# Patient Record
Sex: Male | Born: 1935
Health system: Southern US, Community
[De-identification: ages and names within clinical notes are randomized; demographics above are authoritative.]

## PROBLEM LIST (undated history)

## (undated) DIAGNOSIS — E785 Hyperlipidemia, unspecified: Secondary | ICD-10-CM

## (undated) DIAGNOSIS — I219 Acute myocardial infarction, unspecified: Secondary | ICD-10-CM

## (undated) DIAGNOSIS — J449 Chronic obstructive pulmonary disease, unspecified: Secondary | ICD-10-CM

## (undated) DIAGNOSIS — E119 Type 2 diabetes mellitus without complications: Secondary | ICD-10-CM

## (undated) DIAGNOSIS — E079 Disorder of thyroid, unspecified: Secondary | ICD-10-CM

## (undated) DIAGNOSIS — I714 Abdominal aortic aneurysm, without rupture, unspecified: Secondary | ICD-10-CM

## (undated) DIAGNOSIS — J439 Emphysema, unspecified: Secondary | ICD-10-CM

## (undated) DIAGNOSIS — I639 Cerebral infarction, unspecified: Secondary | ICD-10-CM

## (undated) HISTORY — DX: Chronic obstructive pulmonary disease, unspecified: J44.9

## (undated) HISTORY — DX: Hyperlipidemia, unspecified: E78.5

## (undated) HISTORY — DX: Emphysema, unspecified: J43.9

## (undated) HISTORY — DX: Disorder of thyroid, unspecified: E07.9

## (undated) HISTORY — PX: HERNIA REPAIR: SHX51

## (undated) HISTORY — DX: Abdominal aortic aneurysm, without rupture, unspecified: I71.40

## (undated) HISTORY — DX: Abdominal aortic aneurysm, without rupture: I71.4

## (undated) HISTORY — PX: ABDOMINAL AORTA STENT: SHX1108

## (undated) HISTORY — DX: Cerebral infarction, unspecified: I63.9

## (undated) HISTORY — DX: Acute myocardial infarction, unspecified: I21.9

---

## 1999-03-21 ENCOUNTER — Encounter: Payer: Self-pay | Admitting: Emergency Medicine

## 1999-03-21 ENCOUNTER — Encounter: Payer: Self-pay | Admitting: Internal Medicine

## 1999-03-21 ENCOUNTER — Inpatient Hospital Stay (HOSPITAL_COMMUNITY): Admission: EM | Admit: 1999-03-21 | Discharge: 1999-03-26 | Payer: Self-pay | Admitting: Emergency Medicine

## 1999-03-22 ENCOUNTER — Encounter: Payer: Self-pay | Admitting: Internal Medicine

## 1999-03-24 ENCOUNTER — Encounter: Payer: Self-pay | Admitting: Internal Medicine

## 1999-12-11 ENCOUNTER — Inpatient Hospital Stay (HOSPITAL_COMMUNITY): Admission: EM | Admit: 1999-12-11 | Discharge: 1999-12-15 | Payer: Self-pay | Admitting: Emergency Medicine

## 1999-12-11 ENCOUNTER — Encounter: Payer: Self-pay | Admitting: Emergency Medicine

## 2004-10-29 ENCOUNTER — Ambulatory Visit (HOSPITAL_COMMUNITY): Admission: RE | Admit: 2004-10-29 | Discharge: 2004-10-29 | Payer: Self-pay | Admitting: Surgery

## 2004-11-01 ENCOUNTER — Emergency Department (HOSPITAL_COMMUNITY): Admission: EM | Admit: 2004-11-01 | Discharge: 2004-11-01 | Payer: Self-pay | Admitting: Emergency Medicine

## 2005-09-07 ENCOUNTER — Encounter: Admission: RE | Admit: 2005-09-07 | Discharge: 2005-09-07 | Payer: Self-pay | Admitting: Internal Medicine

## 2006-09-23 ENCOUNTER — Encounter: Admission: RE | Admit: 2006-09-23 | Discharge: 2006-09-23 | Payer: Self-pay | Admitting: Internal Medicine

## 2008-06-06 ENCOUNTER — Encounter: Admission: RE | Admit: 2008-06-06 | Discharge: 2008-06-06 | Payer: Self-pay | Admitting: Internal Medicine

## 2010-02-18 ENCOUNTER — Encounter: Admission: RE | Admit: 2010-02-18 | Discharge: 2010-02-18 | Payer: Self-pay | Admitting: Internal Medicine

## 2010-03-19 ENCOUNTER — Ambulatory Visit: Payer: Self-pay | Admitting: Vascular Surgery

## 2010-09-16 ENCOUNTER — Ambulatory Visit: Payer: Self-pay | Admitting: Vascular Surgery

## 2010-09-21 ENCOUNTER — Ambulatory Visit (HOSPITAL_COMMUNITY)
Admission: RE | Admit: 2010-09-21 | Discharge: 2010-09-21 | Payer: Self-pay | Source: Home / Self Care | Attending: Vascular Surgery | Admitting: Vascular Surgery

## 2010-10-07 ENCOUNTER — Ambulatory Visit
Admission: RE | Admit: 2010-10-07 | Discharge: 2010-10-07 | Payer: Self-pay | Source: Home / Self Care | Attending: Vascular Surgery | Admitting: Vascular Surgery

## 2010-10-15 ENCOUNTER — Inpatient Hospital Stay (HOSPITAL_COMMUNITY)
Admission: RE | Admit: 2010-10-15 | Discharge: 2010-10-18 | Payer: Self-pay | Source: Home / Self Care | Attending: Vascular Surgery | Admitting: Vascular Surgery

## 2010-10-19 LAB — CBC
HCT: 44.9 % (ref 39.0–52.0)
HCT: 33.7 % — ABNORMAL LOW (ref 39.0–52.0)
HCT: 36 % — ABNORMAL LOW (ref 39.0–52.0)
Hemoglobin: 15.5 g/dL (ref 13.0–17.0)
Hemoglobin: 11.5 g/dL — ABNORMAL LOW (ref 13.0–17.0)
Hemoglobin: 12 g/dL — ABNORMAL LOW (ref 13.0–17.0)
MCH: 32.8 pg (ref 26.0–34.0)
MCH: 32.1 pg (ref 26.0–34.0)
MCH: 31.4 pg (ref 26.0–34.0)
MCHC: 34.5 g/dL (ref 30.0–36.0)
MCHC: 34.1 g/dL (ref 30.0–36.0)
MCHC: 33.3 g/dL (ref 30.0–36.0)
MCV: 95.1 fL (ref 78.0–100.0)
MCV: 94.1 fL (ref 78.0–100.0)
MCV: 94.2 fL (ref 78.0–100.0)
Platelets: 233 10*3/uL (ref 150–400)
Platelets: 141 10*3/uL — ABNORMAL LOW (ref 150–400)
Platelets: 175 10*3/uL (ref 150–400)
RBC: 4.72 MIL/uL (ref 4.22–5.81)
RBC: 3.58 MIL/uL — ABNORMAL LOW (ref 4.22–5.81)
RBC: 3.82 MIL/uL — ABNORMAL LOW (ref 4.22–5.81)
RDW: 13.5 % (ref 11.5–15.5)
RDW: 13.6 % (ref 11.5–15.5)
RDW: 13.7 % (ref 11.5–15.5)
WBC: 11.3 10*3/uL — ABNORMAL HIGH (ref 4.0–10.5)
WBC: 9.6 10*3/uL (ref 4.0–10.5)
WBC: 10.6 10*3/uL — ABNORMAL HIGH (ref 4.0–10.5)

## 2010-10-19 LAB — TYPE AND SCREEN
ABO/RH(D): O NEG
Antibody Screen: NEGATIVE

## 2010-10-19 LAB — COMPREHENSIVE METABOLIC PANEL
ALT: 10 U/L (ref 0–53)
AST: 16 U/L (ref 0–37)
Albumin: 3.6 g/dL (ref 3.5–5.2)
Alkaline Phosphatase: 79 U/L (ref 39–117)
BUN: 18 mg/dL (ref 6–23)
CO2: 25 mEq/L (ref 19–32)
Calcium: 9.6 mg/dL (ref 8.4–10.5)
Chloride: 107 mEq/L (ref 96–112)
Creatinine, Ser: 1.08 mg/dL (ref 0.4–1.5)
GFR calc Af Amer: >60 mL/min (ref >60)
GFR calc non Af Amer: >60 mL/min (ref >60)
Glucose, Bld: 169 mg/dL — ABNORMAL HIGH (ref 70–99)
Potassium: 4.2 mEq/L (ref 3.5–5.1)
Sodium: 141 mEq/L (ref 135–145)
Total Bilirubin: 0.4 mg/dL (ref 0.3–1.2)
Total Protein: 6.1 g/dL (ref 6.0–8.3)

## 2010-10-19 LAB — BASIC METABOLIC PANEL
BUN: 11 mg/dL (ref 6–23)
BUN: 9 mg/dL (ref 6–23)
CO2: 26 mEq/L (ref 19–32)
CO2: 26 mEq/L (ref 19–32)
Calcium: 8.2 mg/dL — ABNORMAL LOW (ref 8.4–10.5)
Calcium: 8.7 mg/dL (ref 8.4–10.5)
Chloride: 106 mEq/L (ref 96–112)
Chloride: 105 mEq/L (ref 96–112)
Creatinine, Ser: 0.74 mg/dL (ref 0.4–1.5)
Creatinine, Ser: 0.89 mg/dL (ref 0.4–1.5)
GFR calc Af Amer: >60 mL/min (ref >60)
GFR calc Af Amer: >60 mL/min (ref >60)
GFR calc non Af Amer: >60 mL/min (ref >60)
GFR calc non Af Amer: >60 mL/min (ref >60)
Glucose, Bld: 131 mg/dL — ABNORMAL HIGH (ref 70–99)
Glucose, Bld: 169 mg/dL — ABNORMAL HIGH (ref 70–99)
Potassium: 3.8 mEq/L (ref 3.5–5.1)
Potassium: 4.1 mEq/L (ref 3.5–5.1)
Sodium: 137 mEq/L (ref 135–145)
Sodium: 136 mEq/L (ref 135–145)

## 2010-10-19 LAB — GLUCOSE, CAPILLARY
Glucose-Capillary: 154 mg/dL — ABNORMAL HIGH (ref 70–99)
Glucose-Capillary: 113 mg/dL — ABNORMAL HIGH (ref 70–99)

## 2010-10-19 LAB — URINE MICROSCOPIC-ADD ON

## 2010-10-19 LAB — URINALYSIS, ROUTINE W REFLEX MICROSCOPIC
Bilirubin Urine: NEGATIVE
Ketones, ur: NEGATIVE mg/dL
Leukocytes, UA: NEGATIVE
Nitrite: NEGATIVE
Protein, ur: NEGATIVE mg/dL
Specific Gravity, Urine: 1.019 (ref 1.005–1.030)
Urine Glucose, Fasting: 250 mg/dL — AB
Urobilinogen, UA: 0.2 mg/dL (ref 0.0–1.0)
pH: 5.5 (ref 5.0–8.0)

## 2010-10-19 LAB — BLOOD GAS, ARTERIAL
Acid-Base Excess: 1.3 mmol/L (ref 0.0–2.0)
Bicarbonate: 25.3 mEq/L — ABNORMAL HIGH (ref 20.0–24.0)
Drawn by: 181601
FIO2: 0.21 %
O2 Saturation: 95.2 %
Patient temperature: 98.6
TCO2: 26.5 mmol/L (ref 0–100)
pCO2 arterial: 39.3 mmHg (ref 35.0–45.0)
pH, Arterial: 7.424 (ref 7.350–7.450)
pO2, Arterial: 73.8 mmHg — ABNORMAL LOW (ref 80.0–100.0)

## 2010-10-19 LAB — APTT: aPTT: 23 seconds — ABNORMAL LOW (ref 24–37)

## 2010-10-19 LAB — PROTIME-INR
INR: 0.94 (ref 0.00–1.49)
Prothrombin Time: 12.8 seconds (ref 11.6–15.2)

## 2010-10-19 LAB — SURGICAL PCR SCREEN
MRSA, PCR: NEGATIVE
Staphylococcus aureus: NEGATIVE

## 2010-10-19 LAB — ABO/RH: ABO/RH(D): O NEG

## 2010-10-21 ENCOUNTER — Emergency Department (HOSPITAL_COMMUNITY)
Admission: EM | Admit: 2010-10-21 | Discharge: 2010-10-21 | Payer: Self-pay | Source: Home / Self Care | Admitting: Emergency Medicine

## 2010-10-24 ENCOUNTER — Other Ambulatory Visit: Payer: Self-pay | Admitting: Vascular Surgery

## 2010-10-24 DIAGNOSIS — I714 Abdominal aortic aneurysm, without rupture: Secondary | ICD-10-CM

## 2010-10-25 NOTE — Consult Note (Signed)
[name], Joseph [name] NO.:  [id number]  MEDICAL RECORD NO.:  [id number]          PATIENT TYPE:  INP  LOCATION:  2001                         FACILITY:  [facility]  PHYSICIAN:  Joseph Purpura, Joseph [name]      DATE OF BIRTH:  Apr 06, 1930  DATE OF CONSULTATION:  10/17/2010 DATE OF DISCHARGE:                                CONSULTATION   REASON FOR CONSULTATION:  Urinary retention.  PHYSICIAN REQUESTING CONSULTATION:  Fransisco Hertz, Joseph [name]  HISTORY:  Joseph [name] is a 77 year old gentleman with a history of benign prostatic hyperplasia on chronic medical management with tamsulosin under the care of Dr. Johnella [name].  He recently was admitted to the hospital and underwent endovascular repair of an abdominal aortic aneurysm by Dr. Waverly [name].  Postoperatively, he did very well from his procedure, but did develop postoperative urinary retention requiring in-and-out catheterization.  Although,  he did not initially have any hematuria, he was noted to have some blood at the urethral meatus likely a result of some trauma related to catheterizations.  He was initially kept off his tamsulosin, but this was restarted and he still was unable to void 48 hours from his procedure.  Therefore, an indwelling Foley catheter was placed, which returned grossly clear urine and did provide the patient relief.  He denies any prior history of urinary retention.  His baseline urinary symptoms include nocturia 2-3 times per night, which he noticed it significantly worsened when not on alpha blocker therapy.  He has been on therapy for approximately the last 2-3 years.  He denies a history of gross hematuria, UTIs, urolithiasis, GU malignancy/trauma/surgery.  PAST MEDICAL HISTORY: 1. Diabetes. 2. Dyslipidemia. 3. Coronary artery disease with history of myocardial infarction 10     years ago.  He is followed by Joseph [name], Joseph [name].  PAST SURGICAL HISTORY: 1. Left inguinal hernia repair. 2.  Percutaneous endovascular repair of abdominal aortic aneurysm.  MEDICATIONS:  Home medications include tamsulosin, aspirin, metformin, lisinopril, Zetia, Travatan.  ALLERGIES:  He has intolerance to STATINS.  FAMILY HISTORY:  No history of GU malignancy.  SOCIAL HISTORY:  He is married and has 3 children.  He is retired.  He does have a history of tobacco use and smoked a pack to a pack and a half of cigarettes per day for many years, but did quit smoking 5-6 years ago.  REVIEW OF SYSTEMS:  A complete review of systems was performed.  All systems are reviewed and are otherwise negative.  PHYSICAL EXAMINATION:  VITAL SIGNS:  He is currently afebrile with stable vital signs.  Temperature is 97.9, pulse 102, respirations 18, blood pressure 132/70. CONSTITUTIONAL:  Well-nourished, well-developed, age-appropriate male in no acute distress. HEENT:  Normocephalic, atraumatic. NECK:  Supple without lymphadenopathy or JVD. CARDIOVASCULAR:  Regular rate and rhythm. LUNGS:  Normal respiratory effort. ABDOMEN:  Soft, nontender, nondistended without abdominal masses.  He does have small bilateral inguinal incisions related to his recent endovascular repair. GU:  Normal male external genitalia with an indwelling 14-French Foley catheter draining grossly clear urine. DRE:  He has normal sphincter tone without rectal masses.  His prostate  measures approximately 60 grams without nodularity or induration. EXTREMITIES:  No edema. NEUROLOGIC:  Grossly intact.  LABORATORY DATA:  Serum creatinine 0.89.  Hemoglobin 12.0.  Urinalysis on October 13, 2010 preoperatively demonstrated 0-2 red blood cells and 0-2 white blood cells.  He did undergo a CT scan of the abdomen and pelvis on Feb 18, 2010, which was reviewed considering the concern for hematuria.  This did demonstrate a hyperdense lesion of the left kidney measuring 1.3 cm.  This appeared to be most consistent with a complex hemorrhagic  system, was stable from prior evaluation.  However, a small renal malignancy could not be excluded.  On evaluation of his bladder, he was noted to have bladder diverticula with calculi within the diverticula.  IMPRESSION: 1. Postoperative urinary retention:  He should continue current alpha     blocker therapy with tamsulosin 0.4 mg.  He will be scheduled to     follow up for a voiding trial in the next week. 2. Hematuria/left renal mass:  His hematuria or more accurately     urethral blood is most likely related to trauma from repeated     catheterization during his hospitalization.  He does not appear to     have any very concerning findings that would explain his hematuria     from his CT scan in May except for the possibility of bladder     calculi from which, he has been asymptomatic.  He does have a small     left renal lesion, which is likely benign but even if malignant is     unlikely to cause him any significant problems.  This can be     followed with followup imaging and we will arrange this at his     followup outpatient visit. 3. Bladder diverticula with bladder calculi:  I would plan to follow     this if he is asymptomatic and not having recurrent urinary tract     infections or other symptoms related to his bladder calculi.     Joseph Purpura, Joseph [name]     LB/MEDQ  D:  10/17/2010  T:  10/18/2010  Job:  [id number]  cc:   Joseph [name], M.D. Fransisco Hertz, Joseph [name] Di Kindle. Edilia Bo, M.D.  Electronically Signed by Joseph Purpura Joseph [name] on 10/25/2010 05:18:07 PM

## 2010-10-26 LAB — URINE MICROSCOPIC-ADD ON

## 2010-10-26 LAB — URINALYSIS, ROUTINE W REFLEX MICROSCOPIC
Leukocytes, UA: NEGATIVE
Nitrite: NEGATIVE
Protein, ur: 30 mg/dL — AB
Urobilinogen, UA: 1 mg/dL (ref 0.0–1.0)

## 2010-10-26 LAB — URINE CULTURE: Colony Count: 25000

## 2010-11-04 ENCOUNTER — Other Ambulatory Visit: Payer: Self-pay

## 2010-11-11 ENCOUNTER — Ambulatory Visit (INDEPENDENT_AMBULATORY_CARE_PROVIDER_SITE_OTHER): Payer: Medicare Other | Admitting: Vascular Surgery

## 2010-11-11 ENCOUNTER — Ambulatory Visit
Admission: RE | Admit: 2010-11-11 | Discharge: 2010-11-11 | Disposition: A | Discharge status: Home / Self Care | Payer: Medicare Other | Source: Ambulatory Visit | Attending: Vascular Surgery | Admitting: Vascular Surgery

## 2010-11-11 ENCOUNTER — Encounter (INDEPENDENT_AMBULATORY_CARE_PROVIDER_SITE_OTHER): Payer: Medicare Other

## 2010-11-11 DIAGNOSIS — I714 Abdominal aortic aneurysm, without rupture, unspecified: Secondary | ICD-10-CM

## 2010-11-11 DIAGNOSIS — Z48812 Encounter for surgical aftercare following surgery on the circulatory system: Secondary | ICD-10-CM

## 2010-11-11 MED ORDER — IOHEXOL 300 MG/ML  SOLN
100.0000 mL | Freq: Once | INTRAMUSCULAR | Status: AC | PRN
  Administered 2010-11-11: 100 mL via INTRAVENOUS

## 2010-11-14 ENCOUNTER — Emergency Department (HOSPITAL_COMMUNITY)
Admission: EM | Admit: 2010-11-14 | Discharge: 2010-11-14 | Disposition: A | Discharge status: Home / Self Care | Payer: Medicare Other | Attending: Emergency Medicine | Admitting: Emergency Medicine

## 2010-11-14 DIAGNOSIS — R300 Dysuria: Secondary | ICD-10-CM | POA: Insufficient documentation

## 2010-11-14 DIAGNOSIS — I252 Old myocardial infarction: Secondary | ICD-10-CM | POA: Insufficient documentation

## 2010-11-14 DIAGNOSIS — E119 Type 2 diabetes mellitus without complications: Secondary | ICD-10-CM | POA: Insufficient documentation

## 2010-11-14 DIAGNOSIS — N390 Urinary tract infection, site not specified: Secondary | ICD-10-CM | POA: Insufficient documentation

## 2010-11-14 DIAGNOSIS — I2510 Atherosclerotic heart disease of native coronary artery without angina pectoris: Secondary | ICD-10-CM | POA: Insufficient documentation

## 2010-11-14 LAB — URINE MICROSCOPIC-ADD ON

## 2010-11-14 LAB — URINALYSIS, ROUTINE W REFLEX MICROSCOPIC
Specific Gravity, Urine: 1.02 (ref 1.005–1.030)
Urobilinogen, UA: 1 mg/dL (ref 0.0–1.0)

## 2010-11-16 LAB — URINE CULTURE
Colony Count: >=100000
Culture  Setup Time: 201202112035

## 2010-11-16 NOTE — Assessment & Plan Note (Signed)
OFFICE VISIT  Joseph [name], Joseph [name] DOB:  06-27-1930                                       11/11/2010 [id number]#:[id number]  I saw this patient in the office for follow-up after his recent EVAR. This is [name] pleasant 77 year old gentleman who presented with [name] 5.6 cm infrarenal abdominal aortic aneurysm.  He was felt be [name] candidate for endovascular repair underwent percutaneous endovascular repair of his aneurysm on 10/15/2010.  Postoperative course was complicated by problems with urinary retention and he ended up having to go home with [name] Foley catheter. He has been followed by the urologist.  His only other complaint has been some generalized weakness.  PHYSICAL EXAMINATION:  This is [name] pleasant 77 year old gentleman who appears stated age.  Temperature is 97.8, blood pressure is 111/70, heart rate is 103.  His groins look fine with no hematoma.  Abdomen: Soft and nontender.  Lungs:  Clear bilaterally to auscultation.  I independently interpreted his arterial Doppler study which shows [name] triphasic dorsalis pedis signal on the right with an ABI of 86%.  He has triphasic signals in the left with an ABI of 97%.  I also reviewed his CT scan which has not yet been interpreted by the radiologist and this shows good position of his stent graft with no evidence of endoleak and stable size of his aneurysm.  Overall I am pleased with his progress.  I plan on seeing him back in 6 months with follow-up CT scan.  He knows to call sooner if he has problems.    Di Kindle. Edilia Bo, M.D. Electronically Signed  CSD/MEDQ  D:  11/11/2010  T:  11/12/2010  Job:  [id number]  cc:   Lyn Records, M.D. Candyce Churn, M.D.

## 2010-11-27 ENCOUNTER — Other Ambulatory Visit: Payer: Self-pay | Admitting: Internal Medicine

## 2010-11-27 DIAGNOSIS — E0590 Thyrotoxicosis, unspecified without thyrotoxic crisis or storm: Secondary | ICD-10-CM

## 2010-11-30 ENCOUNTER — Ambulatory Visit
Admission: RE | Admit: 2010-11-30 | Discharge: 2010-11-30 | Disposition: A | Discharge status: Home / Self Care | Payer: Medicare Other | Source: Ambulatory Visit | Attending: Internal Medicine | Admitting: Internal Medicine

## 2010-11-30 DIAGNOSIS — E0590 Thyrotoxicosis, unspecified without thyrotoxic crisis or storm: Secondary | ICD-10-CM

## 2010-12-14 ENCOUNTER — Encounter (HOSPITAL_COMMUNITY): Payer: Medicare Other

## 2010-12-14 ENCOUNTER — Other Ambulatory Visit: Payer: Self-pay | Admitting: Urology

## 2010-12-14 LAB — POCT I-STAT, CHEM 8
BUN: 21 mg/dL (ref 6–23)
Calcium, Ion: 1.13 mmol/L (ref 1.12–1.32)
Creatinine, Ser: 0.9 mg/dL (ref 0.4–1.5)
Glucose, Bld: 142 mg/dL — ABNORMAL HIGH (ref 70–99)
HCT: 47 % (ref 39.0–52.0)
TCO2: 28 mmol/L (ref 0–100)

## 2010-12-14 LAB — BASIC METABOLIC PANEL
BUN: 19 mg/dL (ref 6–23)
Chloride: 101 mEq/L (ref 96–112)
Creatinine, Ser: 0.79 mg/dL (ref 0.4–1.5)
GFR calc non Af Amer: >60 mL/min (ref >60)
Glucose, Bld: 96 mg/dL (ref 70–99)
Potassium: 4.7 mEq/L (ref 3.5–5.1)

## 2010-12-14 LAB — CBC
MCV: 95.6 fL (ref 78.0–100.0)
RBC: 4.74 MIL/uL (ref 4.22–5.81)
WBC: 8.5 10*3/uL (ref 4.0–10.5)

## 2010-12-14 LAB — SURGICAL PCR SCREEN: Staphylococcus aureus: NEGATIVE

## 2010-12-18 ENCOUNTER — Observation Stay (HOSPITAL_COMMUNITY)
Admission: RE | Admit: 2010-12-18 | Discharge: 2010-12-19 | Disposition: A | Discharge status: Home / Self Care | Payer: Medicare Other | Source: Ambulatory Visit | Attending: Urology | Admitting: Urology

## 2010-12-18 DIAGNOSIS — J449 Chronic obstructive pulmonary disease, unspecified: Secondary | ICD-10-CM | POA: Insufficient documentation

## 2010-12-18 DIAGNOSIS — N320 Bladder-neck obstruction: Secondary | ICD-10-CM | POA: Insufficient documentation

## 2010-12-18 DIAGNOSIS — N401 Enlarged prostate with lower urinary tract symptoms: Principal | ICD-10-CM | POA: Insufficient documentation

## 2010-12-18 DIAGNOSIS — E119 Type 2 diabetes mellitus without complications: Secondary | ICD-10-CM | POA: Insufficient documentation

## 2010-12-18 DIAGNOSIS — J4489 Other specified chronic obstructive pulmonary disease: Secondary | ICD-10-CM | POA: Insufficient documentation

## 2010-12-18 DIAGNOSIS — Z9861 Coronary angioplasty status: Secondary | ICD-10-CM | POA: Insufficient documentation

## 2010-12-18 DIAGNOSIS — N210 Calculus in bladder: Secondary | ICD-10-CM | POA: Insufficient documentation

## 2010-12-18 DIAGNOSIS — N138 Other obstructive and reflux uropathy: Principal | ICD-10-CM | POA: Insufficient documentation

## 2010-12-18 DIAGNOSIS — Z01812 Encounter for preprocedural laboratory examination: Secondary | ICD-10-CM | POA: Insufficient documentation

## 2010-12-18 DIAGNOSIS — I2510 Atherosclerotic heart disease of native coronary artery without angina pectoris: Secondary | ICD-10-CM | POA: Insufficient documentation

## 2010-12-18 DIAGNOSIS — N323 Diverticulum of bladder: Secondary | ICD-10-CM | POA: Insufficient documentation

## 2010-12-18 DIAGNOSIS — Z79899 Other long term (current) drug therapy: Secondary | ICD-10-CM | POA: Insufficient documentation

## 2010-12-18 DIAGNOSIS — Z87891 Personal history of nicotine dependence: Secondary | ICD-10-CM | POA: Insufficient documentation

## 2010-12-18 DIAGNOSIS — I10 Essential (primary) hypertension: Secondary | ICD-10-CM | POA: Insufficient documentation

## 2010-12-18 LAB — GLUCOSE, CAPILLARY
Glucose-Capillary: 112 mg/dL — ABNORMAL HIGH (ref 70–99)
Glucose-Capillary: 101 mg/dL — ABNORMAL HIGH (ref 70–99)

## 2010-12-19 LAB — GLUCOSE, CAPILLARY

## 2010-12-20 NOTE — Op Note (Signed)
NAMEMAKAIL, Joseph [name] NO.:  [id number]  MEDICAL RECORD NO.:  [id number]           PATIENT TYPE:  O  LOCATION:  DAYL                         FACILITY:  [facility]  PHYSICIAN:  Heloise Purpura, MD      DATE OF BIRTH:  1930-07-22  DATE OF PROCEDURE:  12/18/2010 DATE OF DISCHARGE:                              OPERATIVE REPORT   PREOPERATIVE DIAGNOSES: 1. Bladder outlet obstruction secondary to benign prostatic     hyperplasia. 2. Bladder calculi. 3. Recurrent urinary tract infections.  POSTOPERATIVE DIAGNOSES: 1. Bladder outlet obstruction secondary to benign prostatic     hyperplasia. 2. Bladder calculi. 3. Recurrent urinary tract infections.  PROCEDURE: 1. Cystoscopy. 2. Transurethral vaporization of the prostate. 3. Removal of bladder calculi.  SURGEON:  Heloise Purpura, MD  ASSISTANT:  None.  COMPLICATIONS:  None.  ESTIMATED BLOOD LOSS:  Minimal.  INDICATIONS:  Mr. Gear is a 77 year old gentleman who has a history of recurrent urinary tract infections and bladder calculi felt to be secondary to benign prostatic hyperplasia and bladder outlet obstruction.  He is also known to have bladder diverticula.  Based on his recurrent multiple infections, we discussed options for treatment and he did elect to proceed with a procedure for his bladder outlet obstruction.  After reviewing options, he consented to proceed with transurethral vaporization of the prostate.  The potential risks, complications, and alternative options associated with this procedure were discussed in detail and informed consent obtained.  DESCRIPTION OF PROCEDURE:  The patient was taken to the operating room and a general anesthetic was administered.  He was given preoperative antibiotics, placed in the dorsal lithotomy position, and prepped and draped in the usual sterile fashion.  Next, a preoperative time-out was performed.  Cystourethroscopy was then performed which revealed  normal anterior urethra.  Inspection of the bladder did reveal a high bladder neck with lateral lobe hypertrophy.  Inspection of the bladder revealed a ureteral orifices in the normal anatomic position.  There were too large bladder diverticula consistent with Hutch diverticula.  There was moderate to severe trabeculation throughout the bladder.  No bladder tumors or other mucosal pathology was identified.  There were noted to be small multiple bladder calculi at the base of the bladder.  After cystoscopy was completed with both the 12 and 70 degrees lenses, the patient's urethra was serially dilated from 22-French up to 28-French. The 26-French resectoscope sheath was then placed into the bladder without difficulty and using the gyrus vaporization button, the patient's prostate was vaporized utilizing saline for irrigation.  The prostatic urethra from the bladder neck back to the verumontanum was examined and the adenoma was vaporized in a systematic fashion beginning at 6 o'clock and then extending laterally and anteriorly.  Once the prostatic adenoma had been completely vaporized, reinspection of the bladder revealed the aforementioned bladder calculi which were removed with Toomey syringe irrigation.  Hemostasis was achieved with electrocautery and a 22-French three-way catheter was placed with a catheter plug in now continuous irrigation.  The patient tolerated the procedure well without complications.  He was able to be awakened and transferred to recovery unit in  satisfactory condition.     Heloise Purpura, MD     LB/MEDQ  D:  12/18/2010  T:  12/18/2010  Job:  [id number]  Electronically Signed by Heloise Purpura MD on 12/20/2010 04:44:34 PM

## 2010-12-30 NOTE — Discharge Summary (Signed)
  [name], Joseph [name]              ACCOUNT NO.:  [id number]  MEDICAL RECORD NO.:  [id number]           PATIENT TYPE:  O  LOCATION:  1408                         FACILITY:  [facility]  PHYSICIAN:  Heloise Purpura, MD      DATE OF BIRTH:  09-02-1930  DATE OF ADMISSION:  12/18/2010 DATE OF DISCHARGE:  12/19/2010                              DISCHARGE SUMMARY   ADMISSION DIAGNOSIS: 1. Bladder outlet obstruction secondary to benign prostatic     hyperplasia. 2. Bladder calculi. 3. Recurrent urinary tract infections.  DISCHARGE DIAGNOSES: 1. Bladder outlet obstruction secondary to benign prostatic     hyperplasia. 2. Bladder calculi. 3. Recurrent urinary tract infections.  PROCEDURES: 1. Cystoscopy. 2. Transurethral vaporization of the prostate.  HISTORY AND PHYSICAL:  For full details, please see admission history and physical.  Briefly, [name] is a 77 year old gentleman who has had recurrent urinary tract infections.  He has a longstanding history of benign prostatic hyperplasia, status post TURP, in the distant past. He was felt to have recurrent benign prostatic hyperplasia based on his evaluation which included recurrent urinary tract infections and bladder calculi.  We discussed options for reducing the risk of these complications and he elected to proceed with the above procedures.  HOSPITAL COURSE:  On December 18, 2010, he was taken to the operating room and underwent transurethral vaporization of the prostate.  He tolerated this procedure well without complications.  Postoperatively, a Foley catheter was left indwelling and his urine remained clear overnight.  He was able to undergo a voiding trial the following morning which he completed successfully.  He was, therefore, able to be discharged to home in stable condition.  DISPOSITION:  Home.  DISCHARGE MEDICATIONS:  He will resume his regular home medications excepting any aspirin, nonsteroidal anti-inflammatory  drugs, or herbal supplements.  DISCHARGE INSTRUCTIONS:  He has been instructed to resume his regular diet, but to avoid activities that involve heavy lifting or strenuous activity.  FOLLOWUP:  He will follow up in the next 4 weeks with a PVR in the office.     Heloise Purpura, MD     LB/MEDQ  D:  12/20/2010  T:  12/20/2010  Job:  [id number]  Electronically Signed by Heloise Purpura MD on 12/30/2010 10:19:39 PM

## 2011-01-12 ENCOUNTER — Other Ambulatory Visit (HOSPITAL_COMMUNITY): Payer: Self-pay | Admitting: Internal Medicine

## 2011-01-25 ENCOUNTER — Encounter (HOSPITAL_COMMUNITY)
Admission: RE | Admit: 2011-01-25 | Discharge: 2011-01-25 | Disposition: A | Discharge status: Home / Self Care | Payer: Medicare Other | Source: Ambulatory Visit | Attending: Internal Medicine | Admitting: Internal Medicine

## 2011-01-25 DIAGNOSIS — E0520 Thyrotoxicosis with toxic multinodular goiter without thyrotoxic crisis or storm: Secondary | ICD-10-CM | POA: Insufficient documentation

## 2011-01-26 ENCOUNTER — Other Ambulatory Visit (HOSPITAL_COMMUNITY): Payer: Self-pay | Admitting: Internal Medicine

## 2011-01-26 ENCOUNTER — Ambulatory Visit (HOSPITAL_COMMUNITY)
Admission: RE | Admit: 2011-01-26 | Discharge: 2011-01-26 | Disposition: A | Discharge status: Home / Self Care | Payer: Medicare Other | Source: Ambulatory Visit | Attending: Internal Medicine | Admitting: Internal Medicine

## 2011-01-26 ENCOUNTER — Encounter (HOSPITAL_COMMUNITY)
Admission: RE | Admit: 2011-01-26 | Discharge: 2011-01-26 | Disposition: A | Discharge status: Home / Self Care | Payer: Medicare Other | Source: Ambulatory Visit | Attending: Internal Medicine | Admitting: Internal Medicine

## 2011-01-26 DIAGNOSIS — E0520 Thyrotoxicosis with toxic multinodular goiter without thyrotoxic crisis or storm: Secondary | ICD-10-CM | POA: Insufficient documentation

## 2011-01-26 DIAGNOSIS — E0590 Thyrotoxicosis, unspecified without thyrotoxic crisis or storm: Secondary | ICD-10-CM

## 2011-01-26 MED ORDER — SODIUM IODIDE I 131 CAPSULE
29.3000 | Freq: Once | INTRAVENOUS | Status: AC | PRN
  Administered 2011-01-26: 29.3 via ORAL

## 2011-01-26 MED ORDER — SODIUM IODIDE I 131 CAPSULE
9.3000 | Freq: Once | INTRAVENOUS | Status: AC | PRN
  Administered 2011-01-25: 9.3 via ORAL

## 2011-01-26 MED ORDER — SODIUM PERTECHNETATE TC 99M INJECTION
10.0000 | Freq: Once | INTRAVENOUS | Status: AC | PRN
  Administered 2011-01-26: 10 via INTRAVENOUS

## 2011-02-16 NOTE — Procedures (Signed)
DUPLEX ULTRASOUND OF ABDOMINAL AORTA   INDICATION:  AAA followup.   HISTORY:  Diabetes:  Yes.  Cardiac:  MI in 2001.  Hypertension:  No.  Smoking:  No.  Connective Tissue Disorder:  Family History:  No.  Previous Surgery:  No.   DUPLEX EXAM:         AP (cm)                   TRANSVERSE (cm)  Proximal             Not visualized  Mid                  5.04 cm                   5.56 cm  Distal               4.09 cm                   4.75 cm  Right Iliac          1.06 cm                   1.11 cm  Left Iliac           0.75 cm                   0.90 cm   PREVIOUS:  Date: 02/18/2010 by CAT scan  AP:  TRANSVERSE:  5.1   IMPRESSION:  1. Abdominal aortic aneurysm noted with largest measurement of 5.04 cm      X 5.56 cm.  2. Intraluminal clot noted.  3. Proximal abdominal aorta not visualized due to overlying bowel gas.   ___________________________________________  Di Kindle. Edilia Bo, M.D.   EM/MEDQ  D:  09/16/2010  T:  09/16/2010  Job:  [id number]

## 2011-02-16 NOTE — Assessment & Plan Note (Signed)
OFFICE VISIT   Joseph [name]  DOB:  December 27, 1929                                       09/16/2010  ZOXWR#:[id number]   I saw the patient in the office today for continued followup of his  abdominal aortic aneurysm.  I had originally seen him in consultation  with [name] 5.1 cm infrarenal abdominal aortic aneurysm in 03/2010.  We  explained that we generally would not consider elective repair in [name]  normal-risk patient unless the aneurysm reached 5.5 cm in maximum  diameter.  He comes in for his 48-month followup study.   Since I saw him last, he has had no abdominal pain.  He does have some  chronic low back pain mostly on the left side which he has had for about  6 months.  This came on gradually.  It is aggravated by any activity  with really no alleviating factors except for rest.  He has had no leg  pain or leg paresthesias.   PAST MEDICAL HISTORY:  Significant for adult-onset diabetes.  He does  not require insulin.  In addition, he has hypercholesterolemia and  coronary artery disease.  He had [name] myocardial infarction approximately  10 years ago.  He has been seen in the past by Dr. Verdis [name].  He  denies any history of congestive heart failure or history of COPD.   SOCIAL HISTORY:  He is married.  He has 3 children.  He is retired.  He  quit tobacco 5-6 years ago.  He had smoked 1 to 1-1/2 packs per day.   FAMILY HISTORY:  There is no history of premature cardiovascular disease  and he is unaware of any history of aneurysmal disease in his family.   REVIEW OF SYSTEMS:  GENERAL:  He had no recent weight loss, weight gain,  or problems with his appetite.  CARDIOVASCULAR:  He had no chest pain, chest pressure, palpitations, or  arrhythmias.  He does admit to dyspnea on exertion.  He has had no  orthopnea.  He has no history of stroke, TIAs, or amaurosis fugax.  He  has had no claudication, rest pain, or nonhealing ulcers.  He has had no  history of DVT  or phlebitis.  GI:  He has had some problems with constipation.  PULMONARY:  He does have [name] history of COPD.  GU:  He has some urinary frequency and nocturia.  NEUROLOGIC, HEMATOLOGIC, ENT, MUSCULOSKELETAL, PSYCHIATRIC,  INTEGUMENTARY review of systems are unremarkable as documented on the  medical history form in his chart.   PHYSICAL EXAMINATION:  General:  This is [name] pleasant 77 year old  gentleman who appears his stated age.  Blood pressure is 107/70, heart  rate 100, saturation 98%.  HEENT:  Unremarkable.  Lungs:  Clear  bilaterally to auscultation without rales, rhonchi, or wheezing.  Cardiovascular exam:  I do not detect any carotid bruits.  He has [name]  regular rate and rhythm.  He has palpable femoral pulses and palpable  dorsalis pedis and popliteal pulses bilaterally.  I cannot palpate [name]  left posterior tibial pulse.  He does have [name] right posterior tibial  pulse.  Both feet appear adequately perfused and he has no evidence of  atheroembolic disease.  He has no significant lower extremity swelling.  Abdomen:  Soft and nontender.  His aneurysm is palpable and nontender.  Musculoskeletal exam:  He has no major deformities or cyanosis.  Neurologic exam:  He has no focal weakness or paresthesias.  Skin:  There are no ulcers or rashes.   I have independently interpreted his Doppler study today which shows  that the aneurysm has enlarged to 5.6 cm in maximum diameter.  The right  iliac measures 1.06 cm and the left iliac 0.9 cm.   I have also reviewed his CT scan from previously.   Given the enlargement of his aneurysm to now greater than 5.5 cm, I have  recommend elective repair.  On reviewing his CAT scan, it appears that  he would be [name] candidate for an endovascular aneurysm repair which given  his age I think would be ideal.  I have scheduled him for an arteriogram  to further assess him for endovascular repair.  This has been scheduled  for 09/21/2010.  We have discussed the  indications for arteriography and  the potential complications including but not limited to bleeding,  arterial injury, renal insufficiency.  All of his questions were  answered and he is agreeable to proceed.  We will also arrange for him  to see Dr. Verdis [name] for preoperative cardiac evaluation.  Hopefully,  he will be [name] candidate for endovascular repair of his aneurysm and we  have discussed the procedure in the office today.  We have discussed the  potential complications of endovascular aneurysm repair including  endoleak, continued aneurysm expansion, and need for continued followup.  We will make further recommendations pending the results of his  arteriogram and his preoperative cardiac evaluation.     Joseph [name]. Joseph [name], M.D.  Electronically Signed   CSD/MEDQ  D:  09/16/2010  T:  09/17/2010  Job:  [id number]   cc:   Joseph [name], M.D.  Joseph [name], M.D.

## 2011-02-16 NOTE — H&P (Signed)
HISTORY AND PHYSICAL EXAMINATION   October 07, 2010   Re:  [facility], [place] [facility]              DOB:  07-Apr-1930   REASON FOR ADMISSION:  5.6 cm infrarenal abdominal aortic aneurysm.   HISTORY:  This is a pleasant 77 year old gentleman whom I had originally  seen in consultation in June 2011 with a 5.1-cm infrarenal abdominal  aortic aneurysm.  On a followup study in December 2011 by ultrasound,  the aneurysm had enlarged to 5.6 cm.  Of note, this has been  asymptomatic.  Given the enlargement of the aneurysm and the 55 to 10%  per year risk of rupture, elective repair is recommended.   PAST MEDICAL HISTORY:  1. Adult-onset diabetes.  He does not require insulin.  2. Hypercholesterolemia.  3. Coronary artery disease.  4. History of a myocardial infarction 10 years ago.  He denies any      history of congestive heart failure or history of COPD.   SOCIAL HISTORY:  He is married.  He has 3 children.  He is retired.  He  quit tobacco 5 to 6 years ago.  He had smoked 1 to 1-1/2 packs per day  of cigarettes for many years.   FAMILY HISTORY:  There is no history of premature cardiovascular  disease.  He is unaware of aneurysmal disease either.   MEDICATIONS:  1. Aspirin 81 mg p.o. b.i.d.  2. Metformin HCl 500 mg p.o. b.i.d.  3. Lisinopril 20 mg p.o. daily.  4. Zetia 10 mg p.o. daily.  5. Pulmicort 180 mcg 1 puff b.i.d.  6. Travatan 0.004% eyedrops daily.  7. Two other eye drops which he did not have with him.   ALLERGIES:  Statins.   REVIEW OF SYSTEMS:  GENERAL:  He has had no recent weight loss, weight  gain, or problem with his appetite.  CARDIOVASCULAR:  He had an chest  pain, chest pressure, palpitations, or arrhythmias.  He had has no  history of stroke, TIAs, or amaurosis fugax.  He is unaware of any  history of DVT or phlebitis.  GI:  He has an occasional problem with  constipation.  PULMONARY:  He does have a history of COPD.  He has had  no recent  productive cough, bronchitis, asthma, or wheezing.  GU:  He  has had urinary frequency and nocturia.  NEUROLOGIC, HEMATOLOGIC, ENT,  MUSCULOSKELETAL, PSYCHIATRIC, INTEGUMENTARY review of systems is  unremarkable.   PHYSICAL EXAMINATION:  GENERAL:  This is a pleasant 77 year old  gentleman who appears his stated age.  VITAL SIGNS:  Blood pressure 110/68, heart rate is 103, saturation 97%.  HEENT:  Unremarkable.  LUNGS:  Clear bilaterally to auscultation without rales, rhonchi, or  wheezing.  CARDIOVASCULAR:  I do not detect any carotid bruits.  He has a regular  rate and rhythm.  He has palpable femoral pulses and palpable popliteal  and dorsalis pedis pulses bilaterally.  I could not palpate posterior  tibial pulses.  He has no significant lower extremity swelling.  ABDOMEN:  Soft, nontender.  His aneurysm is palpable and nontender.  MUSCULOSKELETAL:  He has no major deformities or cyanosis.  NEUROLOGIC:  He has no focal weakness or paresthesias.   He has undergone a CT scan which shows the aneurysm with no complicating  factors.  He has also had an arteriogram in order to evaluate him for  endovascular repair.  He appears to have a nice infrarenal neck and the  only real challenge appears to be significant tortuosity of his iliac  arteries.  In addition, he has undergone preoperative cardiac evaluation  by Dr. Katrinka Blazing.  Based on his note dated September 23, 2010, he is cleared  for surgery.  His nuclear stress test showed inferior lateral MI with  peri-MI ischemia and ejection fraction of 51%.  This had not changed  compared to the study back in 2007.   We have discussed the options of open repair versus endovascular repair  of his aneurysm.  He has elected like to proceed with endovascular  repair.  We have discussed the potential complications of endovascular  repair including the risk of continued aneurysm enlargement and the need  for continued followup.  We have also discussed the  potential option of  having to proceed with open repair if there are any complications during  the procedure.  I  have explained that we will likely do this  percutaneously as I could have to explore the arteries to see if there  are any issues with that.  We have also discussed the risk and potential  complications including but not limited to renal insufficiency, MI, or  other unpredictable medical problems.  All of his questions were  answered and he is agreeable to proceed.  We will schedule this within  the next few weeks.     Di Kindle. Edilia Bo, M.D.  Electronically Signed   CSD/MEDQ  D:  10/07/2010  T:  10/08/2010  Job:  [id number]   cc:   Candyce Churn, M.D.  Lyn Records, M.D.

## 2011-02-16 NOTE — Consult Note (Signed)
NEW PATIENT CONSULTATION   Joseph Joseph [name], Joseph Joseph [name]  DOB:  Jul 05, 1930                                       03/19/2010  ZOXWR#:[id number]   I saw the patient in the office today in consultation concerning an  abdominal aortic aneurysm.  This is Joseph [name] pleasant 77 year old gentleman who  has been followed with Joseph [name] small aneurysm for many years by Dr. Kevan [name].  On  his most recent followup study he had Joseph [name] CAT scan as he had been having  some left flank pain.  This showed that the aneurysm had enlarged to 5.1  cm.  He was sent for vascular consultation.  Of note, the flank pain  came on gradually approximately 2 months ago.  It has been gradually  getting better and he has really not had any significant pain over the  last week or so.  He has had no associated abdominal pain.  He has had  no nausea or vomiting and no hematuria.  He states that he has had the  aneurysm for at least 4-5 years.   His past medical history is significant for adult onset diabetes,  hypercholesterolemia and coronary artery disease.  He has had Joseph [name]  myocardial infarction in the past and is followed by Dr. Garnette Joseph [name].  He  also has Joseph [name] history of COPD.  In addition, he has hypertension.   FAMILY HISTORY:  There is no history of aneurysmal disease or premature  cardiovascular disease that he is aware of.   SOCIAL HISTORY:  He is married.  He has three children.  He had smoked  for about 40 years but quit many years ago.   REVIEW OF SYSTEMS:  He has had an approximately 40 pound weight loss  over the last year.  He attributes this to having to watch his diet  because of his diabetes.  CARDIOVASCULAR:  He has had no chest pain, chest pressure, palpitations  or arrhythmias.  He has had no claudication, rest pain or nonhealing  ulcers.  He has had no history of stroke, TIAs or amaurosis fugax.  He  has had no history of DVT or phlebitis.  Pulmonary, GI, neurologic, musculoskeletal, psychiatric, ENT,  hematologic  review of systems is unremarkable and is documented on the  medical history from in his chart.  GU:  He does have some nocturia and occasional frequency.   PHYSICAL EXAMINATION:  General:  This is Joseph [name] pleasant 77 year old  gentleman who appears his stated age.  Vital signs:  Blood pressure is  141/72, saturation 96%, heart rate is 109.  HEENT:  Unremarkable.  Lungs:  Are clear bilaterally to auscultation without rales, rhonchi,  wheezes.  Cardiovascular:  I do not detect any carotid bruits.  He has Joseph [name]  regular rate and rhythm.  He has palpable femoral pulses and warm, well-  perfused feet without ischemic ulcers.  Abdomen:  Soft and nontender.  His aneurysm is palpable and nontender.  He has normal pitched bowel  sounds.  Musculoskeletal:  There are no major deformities or cyanosis.  Neurological:  He has no focal weakness or paresthesias.  Skin:  There  are no ulcers or rashes.   I have independently interpreted his CT scan of his abdomen and pelvis.  This shows that the maximum diameter of his aneurysm is 5.1 cm.  There  is Joseph [name] reasonable  infrarenal neck.  The iliacs are somewhat tortuous.  He  does have Joseph [name] cyst in the left kidney.   I have explained that the American Heart Association recommendations  would be to fix an aneurysm at 5.5 cm in the normal risk patient.  His  aneurysm is 5.1 cm.  I have recommended Joseph [name] followup ultrasound in 6  months and I will see him back at that time.  If the aneurysm were to  enlarge to greater than 5.5 cm based on his CAT scan it appears that he  would be Joseph [name] potential candidate for endovascular repair of his aneurysm.     Joseph Joseph [name]. Joseph Joseph [name], M.D.  Electronically Signed   CSD/MEDQ  D:  03/19/2010  T:  03/20/2010  Job:  [id number]   cc:   Joseph Joseph [name], M.D.

## 2011-02-19 NOTE — Discharge Summary (Signed)
[place]  Patient:    Joseph [name], Joseph [name]                     MRN: [id number] Adm. Date:  [id number] Disc. Date: [id number] Attending:  Lyn Records. Iii Dictator:   Anselm Lis, N.P. CC:         Pearla Dubonnet, M.D.                           Discharge Summary  PRIMARY CARE Kashae Carstens:  Pearla Dubonnet, M.D.  PROCEDURES: A. (December 14, 1999) PTCA mid CFX with reduction in stenosis from 80% to less    than 20%.  PTCA of OM1 with reduction in stenosis from 90% to less than    20%.  PTCA of OM2 with reduction in stenosis from 60% to less than 20%.    Lateral hypokinesis with EF of 60%.  B. (December 14, 1999) Cardiac catheterization with the following results:    1. LV gram:  lateral hypokinesis with EF of 60%.    2. Coronary angiography:       A. Left main 30%.       B. LAD 60-70% mid.       C. CFX mid 80%.  OM2 60%.  OM1 90%.       D. RCA:  ectasia; PDA 90%.    3. PTCA (double wire):  Mid CFX with reduction in stenosis from 80% to less      than 20%; PTCA of OM1 with reduction of stenosis from 90% to less than      20%; PTCA of OM2 with reduction of stenosis from 60% to less than 20%.  DISCHARGE DIAGNOSES/HOSPITAL COURSE: 1. Coronary atherosclerotic heart disease:  The patient ruled in for lateral    wall myocardial infarction with peak CK of 1929 with MB fraction 250.    Troponin I of peak of 9.35.  The patient was initiated on and completed 12 hour ReaPro infusion.  The patient was initiated on and completed 12 hour ReaPro infusion.  He will continue Plavix for a 3 week course.  Addition of Lopressor to medical regimen as well as enteric-coated aspirin once daily and sublingual p.r.n. nitroglycerin.  2. Risk factor modifications:  Unknown lipid profile; has had prior myositis    reaction to statins in the past.  He is a former tobacco smoker.  PLAN:  Discharged home in stable improved condition.  DISCHARGE MEDICATIONS: 1.  Enteric-coated aspirin 325 mg once daily. 2. (New) Lopressor 50 mg 1/2 tablet p.o. b.i.d. 3. Nitroglycerin 0.4 p.r.n. chest pain.  ACTIVITY:  No heavy lifting or pushing greater than 10-15 pounds or driving day of discharge.  He may  resume as before.  Patient deferred enrollment in cardiac rehabilitation days 2, secondary to severe DJD which is The patient was told to undergo no overhead lifting or active limiting for him.  DIET:  Low fat low cholesterol.  Dietary instructions for low fat low cholesterol diet will be provided.  He was provided information to enroll in [facility] classes.  WOUND CARE:  May shower.  SPECIAL INSTRUCTIONS:  Patient to call our clinic if he notices a large amount of swelling or bruising in groin area.  FOLLOWUP:  Dr. Verdis Prime - Thursday, December 24, 1999, at 4:30 p.m.  HOSPITAL COURSE:  Please also see dictated H&P.  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  On the day of admission he was watching T.V. when he had the sudden episode of epigastric burning pain lasting about 1-1/2 hours. No nausea, vomiting nor diaphoresis.  Discomfort eventually relieved after four sublingual nitrates.  He presented to [facility] Room where EKG was suggestive of lateral MI and his first set of cardiac enzymes were elevated.  He was initiated IV heparin, IV nitrates and antiplatelet agents.  He did well and maintained free during course of hospital admission.  On December 14, 1999, he was taken to the catheterization lab by Dr. Verdis Prime with the results as noted above.  For rest of the details of hospital admission please see above.  PAST MEDICAL HISTORY: 1. Coronary atherosclerotic heart disease.    A. (1996) myocardial infarction.  Subsequent PTCA of RCA.2. Pancreatitis       (1996 and recurrence in  2000). 3. Arthroscopy of the knee in July of 2000. 4. Chronic right bundle branch block. 5. History of hyperlipidemia with past myositis reaction to statins. 6. History of depression; situational surrounding his mothers death in 15-Jun-1999. 7. 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  LABORATORY DATA AND TESTS:  Serial cardiac enzymes:  First CK is 1107 with MB fraction 137. SEcond CK of 1929 with MB fraction 250.  Third CK is 1251 with MB fraction 207.  Troponin I upon presentation 1.81, subsequently 9.35.  Admission hemoglobin 15, hematocrit 47, WBC 10.  Platelets of 290.  At time of discharge WBC was 10.3, with platelets of 259.  ADMISSION CHEMISTRY PROFILE:  Revealed sodium of 139 with K of 3.7, chloride of 104, CO2 26, glucose 125, BUN 19, creatinine 0.9, and calcium of 8.9.  At the time of discharge creatinine was 0.9, with BUN of 11, K of 4.0.  Initial EKG revealed NSR with right bundle branch block question lateral changes but not really significantly different from 1999.  Chest x-ray revealed no active disease.  Amylase was okay at 35. DD:  12/15/99 TD:  12/15/99 Job: [id number]

## 2011-02-19 NOTE — Cardiovascular Report (Signed)
[place]  Patient:    Joseph [name], Joseph [name]                     MRN: [id number] Proc. Date: 12/14/99 Adm. Date:  [id number] Attending:  Lyn Records. Iii CC:         Pearla Dubonnet, M.D.             Cardiac Catheterization Lab                        Cardiac Catheterization  INDICATIONS FOR PROCEDURE:  Recent non-Q-wave myocardial infarction with significant enzyme elevation.  PROCEDURES PERFORMED: 1. Left heart catheterization. 2. Selective coronary angiography. 3. Left ventriculography by hand injection in LAO and RAO projections. 4. Percutaneous transluminal coronary angioplasty using double-wire technique on    the circumflex obtuse marginal #1 and #2 bifurcation.  CINE NO. [id number]  DESCRIPTION OF PROCEDURE:  After informed consent, a 6-French sheath was inserted into the right femoral artery using the modified Seldinger technique.  A 6-French A2 multipurpose catheter was used for hemodynamic recordings, left ventriculography, and selective left and right coronary angiography.  A #4 left  Judkins catheter was used also for left coronary angiography.  The patient tolerated the diagnostic procedure without complications.  After reviewing the cineangiograms, we decided to perform angioplasty on the circumflex.  A 7-French sheath was exchanged for the 6-French sheath.  A 7-French #4 left Judkins catheter was used to obtain guiding shots, and a Duo-Stat was used and contained two 190-cm long BMW wires.  We gave a total of 3500 units of heparin and a ReoPro bolus and infusion was begun.  We then performed angioplasty on the circumflex and marginal branches using a 3.0 x 20-mm long CrossSail balloon.  Two balloon inflations were performed in the mid circumflex and second obtuse marginal distribution and one  balloon inflation in the circumflex first obtuse marginal distribution.  The patient had chest burning with each balloon  inflation.  Each balloon inflation as for approximately two minutes total time in each territory.  Post procedure, the ACT was 246 seconds.  The patient tolerated the procedure without complications. Stenting was not performed because of the bifurcational location and a reasonably good angiographic result.  RESULTS:   I. Hemodynamic data      A. Aortic pressure 99/62.      B. Left ventricular pressure 99/11.  II. Left ventriculography:  Overall LV function appears normal.  There is mild      lateral wall hypokinesis.  Ejection fraction was estimated to be 60%.  No      mitral regurgitation is noted. III. Selective coronary angiography      A. Left main coronary:  Free of any significant obstruction.      B. Left anterior descending coronary:  The left anterior descending coronary         artery is a large vessel that contains a 60-70% mid vessel stenosis after         the second diagonal.  Multiple luminal irregularities are noted throughout         the LAD.  No high grade obstruction is noted in this vessel.      C. Circumflex artery:  The circumflex artery gives origin to three obtuse         marginal branches.  The second and third obtuse marginal branches follow a  significant plaque-containing stenosis in the mid circumflex.  The second         obtuse marginal contains an 80% stenosis.  The lesion proximal to the         bifurcation is 80% narrowed, depending upon the view used.  Both these         obtuse marginal branches are large, and are clearly larger than the first         obtuse marginal branch which contains a 50% ostial stenosis.      D. Right coronary artery:  The right coronary artery is a large vessel that         contains multiple luminal irregularities.  A bifurcating PDA contains tight         stenosis in both branches after the bifurcation.  Two large LV branches         contain irregularities but no high grade obstruction.  No significant          blockage other than in the PDA is noted in the right coronary.  PERCUTANEOUS CORONARY INTERVENTION:  The PTCA site in the mid circumflex and the second and third obtuse marginal branches gave an excellent final result with less than 20% stenosis noted in all dilated areas with brisk antegrade flow.  CONCLUSIONS: 1. Recent aborted lateral wall infarction due to circumflex occlusion.  The patient    apparently had spontaneous reperfusion. 2. Successful PTCA of the circumflex obtuse marginal #1 and #2 bifurcational    lesion. 3. Moderate mid LAD disease and moderate PDA disease. 4. Normal LV function.  RECOMMENDATION:  Medical therapy.  Continue ReoPro.  Clinical followup with probable nuclear testing of the LAD to rule out ischemia in that territory. DD:  12/14/98 TD:  12/14/99 Job: [id number]

## 2011-02-19 NOTE — Op Note (Signed)
Joseph [name], Joseph [name]              ACCOUNT NO.:  [id number]   MEDICAL RECORD NO.:  [id number]          PATIENT TYPE:  OIB   LOCATION:  NA                           FACILITY:  [facility]   PHYSICIAN:  Velora Heckler, MD      DATE OF BIRTH:  [birth year]   DATE OF PROCEDURE:  10/29/2004  DATE OF DISCHARGE:                                 OPERATIVE REPORT   PREOPERATIVE DIAGNOSIS:  Left inguinal hernia.   POSTOPERATIVE DIAGNOSIS:  Left inguinal hernia.   PROCEDURE:  Repair of left inguinal hernia with Prolene mesh.   SURGEON:  Velora Heckler, M.D.   ANESTHESIA:  General.   ESTIMATED BLOOD LOSS:  Minimal.   PREPARATION:  Betadine.   COMPLICATIONS:  None.   INDICATIONS FOR PROCEDURE:  The patient is a 77 year old white male from  [place], [place], presents at the request of Dr. Johnella Moloney  with a left inguinal hernia that has been present for approximately two  years.  It has gradually increased in size.  He now comes to surgery for  repair.   DESCRIPTION OF PROCEDURE:  The procedure was done in OR 16 at [facility].  The patient was brought to the operating room and placed  in a supine position on the operating table.  Following administration of  general anesthesia, the patient was prepped and draped in the usual strict  aseptic fashion.  After ascertaining an adequate level of anesthesia had  been obtained, a left inguinal incision was made with a #10 blade.  Dissection was carried down through the subcutaneous tissues.  Hemostasis  was obtained with electrocautery.  The external oblique fascia was incised  in line with its fibers and extended through the external inguinal ring.  Cord structures are dissected out of the inguinal canal and encircled with a  Penrose drain.  There was a moderate size direct inguinal hernia present.  Direct inguinal hernia sac is dissected out from the inguinal canal and  reduced.  It is held in reduction with  interrupted 3-0 Vicryl figure-of-  eight sutures.  The floor of the inguinal canal was recreated with a sheet  of Prolene mesh.  The mesh is secured to the pubic tubercle and along the  inguinal ligament with a running 2-0 Novofil suture.  The mesh was split to  accommodate the cord structures.  The superior margin of the mesh was  secured to the transversalis and internal oblique fascia with interrupted 2-  0 Novofil sutures.  The tails of the mesh were overlapped lateral to the  cord structures in the inferior edge of the tails so the mesh is secured to  the inguinal ligament with interrupted 2-0 Novofil sutures.  The cord is  explored.  There was no evidence of indirect inguinal hernia sac.  A small  lipoma of the cord is excised.  Good hemostasis is noted.  Field block is  placed with Marcaine.  The cord structures are returned to the inguinal  canal.  The external oblique fascia was closed with interrupted 3-0 Vicryl  sutures, the subcutaneous  tissues are closed with interrupted 3-0 Vicryl  sutures, the skin was anesthetized with local Marcaine anesthetic.  The skin  incision is closed with a running 4-0 Vicryl subcuticular  suture.  The wound is washed and dried, Benzoin and Steri-Strips are  applied.  Sterile dressings were applied.  The patient was awakened from  anesthesia and brought to the recovery room in stable condition.  The  patient tolerated the procedure well.      TMG/MEDQ  D:  10/29/2004  T:  10/29/2004  Job:  [id number]   cc:   Candyce Churn, M.D.  [address] [place]  [place] [address]  Fax: [phone]

## 2011-02-19 NOTE — H&P (Signed)
[place]  Patient:    Joseph [name], Joseph [name]                     MRN: [id number] Proc. Date: 12/10/99 Adm. Date:  [id number] Attending:  Eleanora Neighbor CC:         Pearla Dubonnet, M.D.                         History and Physical  REASON FOR ADMISSION:  Epigastric discomfort.  HISTORY OF PRESENT ILLNESS:  Mr. 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  He smoked about a pack and a half of cigarettes per day until two years ago with no alcohol use.  He has been married for 47 years and has three children.  HISTORY OF PRESENT ILLNESS:  He was watching TV today when he had the sudden onset of an epigastric burning pain as he describes as if he swallowed a hot coal. It began about 3:30 p.m., lasting a total until about 5 p.m.  There was no nausea,  vomiting, diaphoresis.  He treated it initially with water without help, took three nitroglycerin at home, went to [employer] and took another nitroglycerin without help.  The pain left about 5 p.m.  He now has complete relief although he has had lots of gas and belching.  He had an MI of 1996 with anterior chest pain like his sternal bone was going to explode.  This pain is not like that. He has had a history of pancreatitis on two different occasions in 1996 and in 000 but then he described it as a sudden hurting in a similar location but then his  abdomen would basically swell.  Todays symptoms were really not like either one of those symptoms.  ALLERGIES:  None but the statins cause myalgias.  CURRENT MEDICATIONS:  Aspirin, multivitamins.  PAST SURGICAL HISTORY:  Arthroscopy of the knee in July of 2000 and in August of 2000 he had a cardiac catheterization with angioplasty with his myocardial infarction in 1996.  At that time he had a distal right coronary artery angioplasty and the right  bundle-branch block was chronic and he had 60% occlusion of a diagonal from the LAD and proximal 20-30% stenosis in the LAD.  The first obtuse marginal and a 70% narrowing.  He has also had hyperlipidemia.  He has had a tendency to have arthritis, aches of his joints.  He has a history of pancreatitis of undetermined etiology.  He has a history of a right bundle-branch block.  He had some depression in August of 2000 at the time of his mothers death.  He had degenerative joint disease of his knees which is basically severe.  FAMILY HISTORY:  His mother died this year, she was in her 36 and had congestive heart failure.  Father died of Alzheimers disease in his 75s.  He has one brother that is a heavy smoker and probably has had coronary disease.  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 Darnell Level.  REVIEW OF SYSTEMS:  Basically negative except for the generalized arthritis and  aches.  In particular there is no diarrhea or blood in his stools.  Trouble genitourinary problems or neurological abnormalities.  PHYSICAL EXAMINATION:  VITAL SIGNS:  His heart rate is 76, regular, blood pressure is 120/67.  He  is in no acute distress.  HEENT:  Remarkable for dentures.  LUNGS:  Clear.  HEART:  Heart sounds are distant.  ABDOMEN:  Soft.  Normal bowel sounds.  Nontender.  RECTAL:  Guaiac-negative.  GENITALIA:  Prostate is 2+.  EXTREMITIES:  Without edema.  ECG shows right bundle-branch block, questionable lateral changes but not really significantly different from 1999.  Chest x-ray shows no acute disease.  CK-MB was positive at 137 with troponin of 1.81.  Amylase was 35.  White count as 18,600, hematocrit 48.  OVERALL IMPRESSION: 1. Epigastric pain with positive troponins and CK-MBs. 2. History of myocardial infarction in 1996. 3. History of pancreatitis.  PLAN:  This is certainly atypical symptoms, but lab would suggest  myocardial infarction and we will repeat that.  He is clearly pain-free and stable at this  time and with his ECG not showing acute changes he is not necessarily a candidate for going to the lab acutely.  We will leave him on IV heparin, IV nitroglycerin and antiplatelet agents.  We will rule out myocardial infarction. DD:  12/11/99 TD:  12/12/99 Job: [id number]

## 2011-04-08 ENCOUNTER — Encounter: Payer: Self-pay | Admitting: Vascular Surgery

## 2011-05-12 ENCOUNTER — Other Ambulatory Visit: Payer: Self-pay | Admitting: Vascular Surgery

## 2011-05-12 ENCOUNTER — Ambulatory Visit: Payer: Medicare Other | Admitting: Vascular Surgery

## 2011-05-12 DIAGNOSIS — I714 Abdominal aortic aneurysm, without rupture: Secondary | ICD-10-CM

## 2011-05-26 ENCOUNTER — Other Ambulatory Visit: Payer: Self-pay | Admitting: Vascular Surgery

## 2011-05-26 ENCOUNTER — Encounter: Payer: Self-pay | Admitting: *Deleted

## 2011-05-26 ENCOUNTER — Ambulatory Visit (INDEPENDENT_AMBULATORY_CARE_PROVIDER_SITE_OTHER): Payer: Medicare Other | Admitting: Vascular Surgery

## 2011-05-26 ENCOUNTER — Encounter: Payer: Self-pay | Admitting: Vascular Surgery

## 2011-05-26 ENCOUNTER — Ambulatory Visit
Admission: RE | Admit: 2011-05-26 | Discharge: 2011-05-26 | Disposition: A | Discharge status: Home / Self Care | Payer: Medicare Other | Source: Ambulatory Visit | Attending: Vascular Surgery | Admitting: Vascular Surgery

## 2011-05-26 VITALS — BP 114/62 | HR 91 | Ht 71.0 in | Wt 140.0 lb

## 2011-05-26 DIAGNOSIS — I714 Abdominal aortic aneurysm, without rupture, unspecified: Secondary | ICD-10-CM

## 2011-05-26 DIAGNOSIS — Z9889 Other specified postprocedural states: Secondary | ICD-10-CM

## 2011-05-26 DIAGNOSIS — Z8679 Personal history of other diseases of the circulatory system: Secondary | ICD-10-CM

## 2011-05-26 HISTORY — PX: ABDOMINAL AORTIC ANEURYSM REPAIR: SUR1152

## 2011-05-26 MED ORDER — IOHEXOL 300 MG/ML  SOLN
100.0000 mL | Freq: Once | INTRAMUSCULAR | Status: AC | PRN
  Administered 2011-05-26: 100 mL via INTRAVENOUS

## 2011-05-26 NOTE — Progress Notes (Signed)
Subjective:     Patient ID: Joseph [name], male   DOB: 1930/04/05, 76 y.o.   MRN: [id number]  HPI  This is a pleasant 77 year old gentleman who underwent percutaneous endovascular aneurysm repair on 10/15/2010. Was for a 5.6 cm infrarenal abdominal aortic aneurysm. He comes in for a routine followup visit. He denies any abdominal or back pain. He does state that he had problems with urinary tract infection after his surgery which he attributes to his Foley catheter. He is being followed by the urologist.  The patient has a history of diabetes and hyperlipidemia both of which are stable on his current medications.  Past Medical History  Diagnosis Date  . Diabetes mellitus   . Hyperlipidemia   . Myocardial infarction   . COPD (chronic obstructive pulmonary disease)   . Emphysema of lung   . Abdominal aneurysm without mention of rupture   . Thyroid disease     Hyperthyroidism, Goiter   History  Substance Use Topics  . Smoking status: Former Smoker -- 1.5 packs/day    Quit date: 10/04/2004  . Smokeless tobacco: Never Used  . Alcohol Use: No    Review of Systems  Constitutional: Negative for fever and chills.  Respiratory: Negative for chest tightness and shortness of breath.   Cardiovascular: Negative for chest pain and palpitations.       Objective:   Physical Exam  Constitutional: He is oriented to person, place, and time.  Neck: Neck supple. No JVD present. No thyromegaly present.  Cardiovascular: Normal rate, regular rhythm and normal heart sounds.  Exam reveals no friction rub.   No murmur heard. Pulses:      Radial pulses are 2+ on the right side, and 2+ on the left side.       Femoral pulses are 2+ on the right side, and 2+ on the left side. Pulmonary/Chest: Breath sounds normal. He has no wheezes. He has no rales.  Abdominal: Soft. Bowel sounds are normal. There is no tenderness.       .  Musculoskeletal: He exhibits no edema.  Lymphadenopathy:    He has no  cervical adenopathy.  Neurological: He is alert and oriented to person, place, and time. He has normal strength. No sensory deficit.  Skin: No lesion and no rash noted.   Filed Vitals:   05/26/11 1202  BP: 114/62  Pulse: 91    Body mass index is 19.53 kg/(m^2).  He had a CT scan of his abdomen today which I have independently interpreted. The endograft appears to be in excellent position. The aneurysm hasn't decreased in size and now measures 4.3 cm in maximum diameter. I do not see any evidence of endoleak.      Assessment:     This patient is doing well status post endovascular aneurysm repair. That the aneurysm has decreased in size I think it would be reasonable to use duplex for his next followup study.    Plan:     I have ordered a duplex scan of his endograft in 6 months. I'll see him back at that time. He knows to call sooner if he has problems.

## 2011-06-05 HISTORY — PX: PROSTATE SURGERY: SHX751

## 2011-08-02 ENCOUNTER — Other Ambulatory Visit: Payer: Self-pay | Admitting: *Deleted

## 2011-08-02 DIAGNOSIS — I714 Abdominal aortic aneurysm, without rupture: Secondary | ICD-10-CM

## 2011-09-19 ENCOUNTER — Inpatient Hospital Stay (HOSPITAL_COMMUNITY)
Admission: EM | Admit: 2011-09-19 | Discharge: 2011-10-01 | DRG: 193 | Disposition: A | Discharge status: Skilled Nursing Facility | Payer: Medicare Other | Attending: Internal Medicine | Admitting: Internal Medicine

## 2011-09-19 ENCOUNTER — Encounter (HOSPITAL_COMMUNITY): Payer: Self-pay

## 2011-09-19 ENCOUNTER — Emergency Department (HOSPITAL_COMMUNITY): Payer: Medicare Other

## 2011-09-19 ENCOUNTER — Other Ambulatory Visit: Payer: Self-pay

## 2011-09-19 ENCOUNTER — Encounter: Payer: Self-pay | Admitting: Pulmonary Disease

## 2011-09-19 DIAGNOSIS — E119 Type 2 diabetes mellitus without complications: Secondary | ICD-10-CM | POA: Diagnosis present

## 2011-09-19 DIAGNOSIS — Z8673 Personal history of transient ischemic attack (TIA), and cerebral infarction without residual deficits: Secondary | ICD-10-CM

## 2011-09-19 DIAGNOSIS — I2510 Atherosclerotic heart disease of native coronary artery without angina pectoris: Secondary | ICD-10-CM

## 2011-09-19 DIAGNOSIS — J9620 Acute and chronic respiratory failure, unspecified whether with hypoxia or hypercapnia: Secondary | ICD-10-CM

## 2011-09-19 DIAGNOSIS — J438 Other emphysema: Secondary | ICD-10-CM

## 2011-09-19 DIAGNOSIS — F411 Generalized anxiety disorder: Secondary | ICD-10-CM | POA: Diagnosis present

## 2011-09-19 DIAGNOSIS — I739 Peripheral vascular disease, unspecified: Secondary | ICD-10-CM | POA: Insufficient documentation

## 2011-09-19 DIAGNOSIS — R0603 Acute respiratory distress: Secondary | ICD-10-CM

## 2011-09-19 DIAGNOSIS — E785 Hyperlipidemia, unspecified: Secondary | ICD-10-CM | POA: Diagnosis present

## 2011-09-19 DIAGNOSIS — J189 Pneumonia, unspecified organism: Secondary | ICD-10-CM

## 2011-09-19 DIAGNOSIS — J449 Chronic obstructive pulmonary disease, unspecified: Secondary | ICD-10-CM

## 2011-09-19 DIAGNOSIS — I714 Abdominal aortic aneurysm, without rupture: Secondary | ICD-10-CM

## 2011-09-19 DIAGNOSIS — I10 Essential (primary) hypertension: Secondary | ICD-10-CM | POA: Diagnosis present

## 2011-09-19 DIAGNOSIS — B370 Candidal stomatitis: Secondary | ICD-10-CM | POA: Diagnosis present

## 2011-09-19 DIAGNOSIS — R531 Weakness: Secondary | ICD-10-CM

## 2011-09-19 DIAGNOSIS — J441 Chronic obstructive pulmonary disease with (acute) exacerbation: Secondary | ICD-10-CM | POA: Diagnosis present

## 2011-09-19 LAB — DIFFERENTIAL
Band Neutrophils: 0 % (ref 0–10)
Blasts: 0 %
Lymphocytes Relative: 15 % (ref 12–46)
Lymphs Abs: 2.5 10*3/uL (ref 0.7–4.0)
Monocytes Absolute: 1 10*3/uL (ref 0.1–1.0)
Monocytes Relative: 6 % (ref 3–12)
Neutrophils Relative %: 79 % — ABNORMAL HIGH (ref 43–77)
Promyelocytes Absolute: 0 %
nRBC: 0 /100 WBC

## 2011-09-19 LAB — POCT I-STAT 3, ART BLOOD GAS (G3+)
O2 Saturation: 98 %
TCO2: 28 mmol/L (ref 0–100)
pCO2 arterial: 44.8 mmHg (ref 35.0–45.0)

## 2011-09-19 LAB — PRO B NATRIURETIC PEPTIDE: Pro B Natriuretic peptide (BNP): 163.7 pg/mL (ref 0–450)

## 2011-09-19 LAB — CBC
HCT: 49.3 % (ref 39.0–52.0)
MCHC: 35.3 g/dL (ref 30.0–36.0)
Platelets: 267 10*3/uL (ref 150–400)
RDW: 13.1 % (ref 11.5–15.5)
WBC: 16.7 10*3/uL — ABNORMAL HIGH (ref 4.0–10.5)

## 2011-09-19 LAB — URINALYSIS, ROUTINE W REFLEX MICROSCOPIC
Glucose, UA: 250 mg/dL — AB
Ketones, ur: NEGATIVE mg/dL
Leukocytes, UA: NEGATIVE
Nitrite: NEGATIVE
Protein, ur: NEGATIVE mg/dL
Urobilinogen, UA: 0.2 mg/dL (ref 0.0–1.0)

## 2011-09-19 LAB — COMPREHENSIVE METABOLIC PANEL
Albumin: 3.7 g/dL (ref 3.5–5.2)
Alkaline Phosphatase: 81 U/L (ref 39–117)
BUN: 24 mg/dL — ABNORMAL HIGH (ref 6–23)
Chloride: 96 mEq/L (ref 96–112)
Creatinine, Ser: 0.8 mg/dL (ref 0.50–1.35)
GFR calc Af Amer: >90 mL/min (ref >90)
GFR calc non Af Amer: 83 mL/min — ABNORMAL LOW (ref >90)
Glucose, Bld: 130 mg/dL — ABNORMAL HIGH (ref 70–99)
Potassium: 4.1 mEq/L (ref 3.5–5.1)
Total Bilirubin: 0.5 mg/dL (ref 0.3–1.2)

## 2011-09-19 LAB — PROTIME-INR: INR: 1.07 (ref 0.00–1.49)

## 2011-09-19 LAB — CARDIAC PANEL(CRET KIN+CKTOT+MB+TROPI)
Relative Index: INVALID (ref 0.0–2.5)
Troponin I: <0.3 ng/mL (ref <0.30)

## 2011-09-19 LAB — APTT: aPTT: 27 seconds (ref 24–37)

## 2011-09-19 MED ORDER — TRAVOPROST (BAK FREE) 0.004 % OP SOLN
1.0000 [drp] | Freq: Every day | OPHTHALMIC | Status: DC
  Administered 2011-09-20 – 2011-09-30 (×14): 1 [drp] via OPHTHALMIC
  Filled 2011-09-19: qty 2.5

## 2011-09-19 MED ORDER — IPRATROPIUM BROMIDE 0.02 % IN SOLN
0.5000 mg | RESPIRATORY_TRACT | Status: DC
  Administered 2011-09-20 – 2011-09-22 (×12): 0.5 mg via RESPIRATORY_TRACT
  Filled 2011-09-19 (×12): qty 2.5

## 2011-09-19 MED ORDER — MORPHINE SULFATE 2 MG/ML IJ SOLN
2.0000 mg | Freq: Once | INTRAMUSCULAR | Status: AC
  Administered 2011-09-19: 2 mg via INTRAVENOUS
  Filled 2011-09-19: qty 1

## 2011-09-19 MED ORDER — DEXTROSE 5 % IV SOLN
500.0000 mg | Freq: Once | INTRAVENOUS | Status: AC
  Administered 2011-09-19: 500 mg via INTRAVENOUS
  Filled 2011-09-19: qty 500

## 2011-09-19 MED ORDER — ACETAMINOPHEN 650 MG RE SUPP: 650.0000 mg | Freq: Four times a day (QID) | RECTAL | Status: DC | PRN

## 2011-09-19 MED ORDER — DEXTROSE 5 % IV SOLN
1.0000 g | Freq: Every day | INTRAVENOUS | Status: DC
  Administered 2011-09-20 – 2011-09-26 (×7): 1 g via INTRAVENOUS
  Filled 2011-09-19 (×8): qty 10

## 2011-09-19 MED ORDER — TAMSULOSIN HCL 0.4 MG PO CAPS
0.4000 mg | ORAL_CAPSULE | Freq: Every day | ORAL | Status: DC
  Administered 2011-09-21 – 2011-09-29 (×5): 0.4 mg via ORAL
  Filled 2011-09-19 (×12): qty 1

## 2011-09-19 MED ORDER — ALBUTEROL SULFATE (5 MG/ML) 0.5% IN NEBU
2.5000 mg | INHALATION_SOLUTION | RESPIRATORY_TRACT | Status: DC
  Administered 2011-09-20 – 2011-09-22 (×12): 2.5 mg via RESPIRATORY_TRACT
  Filled 2011-09-19 (×13): qty 0.5

## 2011-09-19 MED ORDER — BRINZOLAMIDE 1 % OP SUSP
1.0000 [drp] | OPHTHALMIC | Status: DC
  Filled 2011-09-19: qty 10

## 2011-09-19 MED ORDER — LEVALBUTEROL HCL 1.25 MG/3ML IN NEBU
1.2500 mg | INHALATION_SOLUTION | Freq: Once | RESPIRATORY_TRACT | Status: AC
  Administered 2011-09-19: 1.25 mg via RESPIRATORY_TRACT
  Filled 2011-09-19: qty 3

## 2011-09-19 MED ORDER — KCL IN DEXTROSE-NACL 20-5-0.45 MEQ/L-%-% IV SOLN
INTRAVENOUS | Status: DC
  Administered 2011-09-20: 01:00:00 via INTRAVENOUS
  Filled 2011-09-19 (×4): qty 1000

## 2011-09-19 MED ORDER — TAMSULOSIN HCL 0.4 MG PO CAPS: 0.4000 mg | ORAL_CAPSULE | ORAL | Status: DC

## 2011-09-19 MED ORDER — SODIUM CHLORIDE 0.9 % IV BOLUS (SEPSIS)
500.0000 mL | Freq: Once | INTRAVENOUS | Status: AC
  Administered 2011-09-19: 500 mL via INTRAVENOUS

## 2011-09-19 MED ORDER — ENOXAPARIN SODIUM 40 MG/0.4ML ~~LOC~~ SOLN
40.0000 mg | Freq: Every day | SUBCUTANEOUS | Status: DC
  Administered 2011-09-20 – 2011-10-01 (×12): 40 mg via SUBCUTANEOUS
  Filled 2011-09-19 (×12): qty 0.4

## 2011-09-19 MED ORDER — CEFTRIAXONE SODIUM 1 G IJ SOLR
1.0000 g | Freq: Once | INTRAMUSCULAR | Status: AC
  Administered 2011-09-19: 1 g via INTRAVENOUS
  Filled 2011-09-19: qty 10

## 2011-09-19 MED ORDER — IPRATROPIUM BROMIDE 0.02 % IN SOLN
0.5000 mg | Freq: Once | RESPIRATORY_TRACT | Status: AC
Start: 2011-09-19 — End: 2011-09-19
  Administered 2011-09-19: 0.5 mg via RESPIRATORY_TRACT

## 2011-09-19 MED ORDER — METHYLPREDNISOLONE SODIUM SUCC 125 MG IJ SOLR
60.0000 mg | Freq: Two times a day (BID) | INTRAMUSCULAR | Status: DC
  Administered 2011-09-20: 60 mg via INTRAVENOUS
  Administered 2011-09-20: 62.5 mg via INTRAVENOUS
  Administered 2011-09-21: 60 mg via INTRAVENOUS
  Administered 2011-09-21: 01:00:00 via INTRAVENOUS
  Administered 2011-09-22: 60 mg via INTRAVENOUS
  Filled 2011-09-19 (×7): qty 0.96

## 2011-09-19 MED ORDER — METFORMIN HCL 500 MG PO TABS: 500.0000 mg | ORAL_TABLET | Freq: Two times a day (BID) | ORAL | Status: DC

## 2011-09-19 MED ORDER — ACETAMINOPHEN 325 MG PO TABS: 650.0000 mg | ORAL_TABLET | Freq: Four times a day (QID) | ORAL | Status: DC | PRN

## 2011-09-19 MED ORDER — SODIUM CHLORIDE 0.9 % IV SOLN: 250.0000 mL | INTRAVENOUS | Status: DC | PRN

## 2011-09-19 MED ORDER — ENOXAPARIN SODIUM 30 MG/0.3ML ~~LOC~~ SOLN: 30.0000 mg | SUBCUTANEOUS | Status: DC

## 2011-09-19 MED ORDER — MORPHINE SULFATE 2 MG/ML IJ SOLN
1.0000 mg | INTRAMUSCULAR | Status: DC | PRN
  Administered 2011-09-19 – 2011-09-20 (×2): 1 mg via INTRAVENOUS
  Filled 2011-09-19 (×3): qty 1

## 2011-09-19 MED ORDER — IPRATROPIUM BROMIDE 0.02 % IN SOLN
RESPIRATORY_TRACT | Status: AC
  Filled 2011-09-19: qty 2.5

## 2011-09-19 MED ORDER — METFORMIN HCL 500 MG PO TABS
500.0000 mg | ORAL_TABLET | Freq: Two times a day (BID) | ORAL | Status: DC
  Administered 2011-09-20 – 2011-10-01 (×21): 500 mg via ORAL
  Filled 2011-09-19 (×25): qty 1

## 2011-09-19 MED ORDER — LISINOPRIL 20 MG PO TABS
20.0000 mg | ORAL_TABLET | Freq: Every day | ORAL | Status: DC
  Administered 2011-09-20 – 2011-09-25 (×6): 20 mg via ORAL
  Filled 2011-09-19 (×6): qty 1

## 2011-09-19 MED ORDER — SODIUM CHLORIDE 0.9 % IV SOLN
INTRAVENOUS | Status: DC
  Administered 2011-09-19 (×3): via INTRAVENOUS

## 2011-09-19 MED ORDER — ALBUTEROL SULFATE (5 MG/ML) 0.5% IN NEBU: 2.5000 mg | INHALATION_SOLUTION | Freq: Four times a day (QID) | RESPIRATORY_TRACT | Status: DC

## 2011-09-19 MED ORDER — ASPIRIN EC 81 MG PO TBEC
81.0000 mg | DELAYED_RELEASE_TABLET | Freq: Every day | ORAL | Status: DC
  Administered 2011-09-20 – 2011-10-01 (×12): 81 mg via ORAL
  Filled 2011-09-19 (×12): qty 1

## 2011-09-19 MED ORDER — EZETIMIBE 10 MG PO TABS
10.0000 mg | ORAL_TABLET | Freq: Every day | ORAL | Status: DC
  Administered 2011-09-20 – 2011-10-01 (×12): 10 mg via ORAL
  Filled 2011-09-19 (×12): qty 1

## 2011-09-19 MED ORDER — DEXTROSE 5 % IV SOLN: 500.0000 mg | INTRAVENOUS | Status: DC

## 2011-09-19 MED ORDER — IPRATROPIUM BROMIDE 0.02 % IN SOLN
0.5000 mg | Freq: Once | RESPIRATORY_TRACT | Status: AC
  Administered 2011-09-19: 13:00:00 via RESPIRATORY_TRACT
  Filled 2011-09-19 (×2): qty 2.5

## 2011-09-19 MED ORDER — SODIUM CHLORIDE 0.9 % IV SOLN: INTRAVENOUS | Status: DC

## 2011-09-19 MED ORDER — ALBUTEROL SULFATE (5 MG/ML) 0.5% IN NEBU
5.0000 mg | INHALATION_SOLUTION | Freq: Once | RESPIRATORY_TRACT | Status: AC
  Administered 2011-09-19: 5 mg via RESPIRATORY_TRACT

## 2011-09-19 MED ORDER — DORZOLAMIDE HCL 2 % OP SOLN
1.0000 [drp] | Freq: Every day | OPHTHALMIC | Status: DC
  Administered 2011-09-20 – 2011-10-01 (×12): 1 [drp] via OPHTHALMIC
  Filled 2011-09-19: qty 10

## 2011-09-19 MED ORDER — METHYLPREDNISOLONE SODIUM SUCC 125 MG IJ SOLR
INTRAMUSCULAR | Status: AC
  Administered 2011-09-19: 13:00:00
  Filled 2011-09-19: qty 2

## 2011-09-19 MED ORDER — IPRATROPIUM BROMIDE 0.02 % IN SOLN: 0.5000 mg | RESPIRATORY_TRACT | Status: DC

## 2011-09-19 MED ORDER — ALBUTEROL SULFATE (5 MG/ML) 0.5% IN NEBU
INHALATION_SOLUTION | RESPIRATORY_TRACT | Status: AC
  Administered 2011-09-19: 13:00:00
  Filled 2011-09-19: qty 2

## 2011-09-19 MED ORDER — AZITHROMYCIN 500 MG IV SOLR
500.0000 mg | Freq: Every day | INTRAVENOUS | Status: DC
  Administered 2011-09-20 – 2011-09-26 (×7): 500 mg via INTRAVENOUS
  Filled 2011-09-19 (×8): qty 500

## 2011-09-19 MED ORDER — EPINEPHRINE HCL 1 MG/ML IJ SOLN
INTRAMUSCULAR | Status: AC
  Administered 2011-09-19: 13:00:00
  Filled 2011-09-19: qty 1

## 2011-09-19 MED ORDER — METHYLPREDNISOLONE SODIUM SUCC 125 MG IJ SOLR: 80.0000 mg | Freq: Two times a day (BID) | INTRAMUSCULAR | Status: DC

## 2011-09-19 MED ORDER — LISINOPRIL 20 MG PO TABS: 20.0000 mg | ORAL_TABLET | Freq: Every day | ORAL | Status: DC

## 2011-09-19 MED ORDER — DEXTROSE 5 % IV SOLN: 1.0000 g | Freq: Two times a day (BID) | INTRAVENOUS | Status: DC

## 2011-09-19 NOTE — Progress Notes (Signed)
Removed Bipap placed patient on 3lpm. Will continue to monitor for increased shortness of breath. Will restart bipap if patient has any respiratory difficulty.

## 2011-09-19 NOTE — Progress Notes (Signed)
Pharmacy - azithromycin + ceftriaxone  Pharmacy asked to dose azithromycin + ceftriaxone for COPD exacerbation/ARF. Currently ordered 500mg  azithromycin daily and ceftriaxone 1gm IV daily. These doses are appropriate and will not require renal adjustment. Pharmacy will sign-off. Please re-consult [place] for further issues. Thank you!  Lysle Pearl, PharmD, BCPS 09/19/2011 11:36 PM

## 2011-09-19 NOTE — H&P (Signed)
Patient name: Joseph [name] Medical record number: [id number] Date of birth: [birth year] Age: 77 y.o. Gender: male PCP: Pearla Dubonnet, MD, MD  Date: 09/19/2011 Referring Physician: [facility]  HPI:  77 y/o WM, pt DrNGates, w/ Hx COPD/Emphysema> exsmoker quit 2006 & on Pulmicort regularly, no rescue inhaler?  Describes recent URI/ bronchitis & started on Levaquin & Pred but he has been unresponsive to outpt Rx & presented to the ER 12/16 w/ dyspnea, resp distress & hypoxemia w/ sats in the 70's in the ER on RA;  He was given Neb w/ albut & ipratrop, IV Solumedrol, (& IM epi as well)> sl improved but tachycardic, persist dyspnea, & placed on Bipap & referred for adm...    He noted dry cough w/o sput, denied CP, denied f/c/s, etc; he has not prev seen a pulmonologist for help w/ his breathing, but neither has he prev been adm for breathing problems; no prev PFTs avail to review; he was a former heavy smoker up to 1.5ppd & quit 2006...   Past Medical History:    HBP- on Lisinopril 20mg /d    CAD- s/p & several PTCAs by DrHSmith> on ASA 81mg /d    ASPVD- s/p endovasc repair of 5.6cm infrarenal AAA by DrCDickson 1/12;     Hyperlipidemia- on Zetia & intol to statins;     DM- on Metformin 500mg  Bid    Hx Hypertyroidism/ Goiter     Hx BPH, Bladder calculi, & recurrent UTIs; on Tamsulosin 0.4mg /d> s/p remote TURP, and more recent transurethral vaporization of the prostate by DrBorden 3/12    Glaucoma on Trav[place] 0.004 % 1gtt ou Qhs & AZOPT 1 % 1gtt left eye Qam   Past Surgical History  Procedure Date  . Hernia repair     LEFT INGUINAL  . Abdominal aorta stent     ENDOSTENT REPAIR 10/15/2010  . Prostate surgery   . Abdominal aortic aneurysm repair 05/26/11    PEVAR    Current Medications, Allergies, Past Medical History, Past Surgical History, Family History, and Social History were reviewed in [employer] record.    Allergies:  Allergies  Allergen Reactions  .  Statins Other (See Comments)    Body aches    Medications:  Prior to Admission medications   Medication Sig Start Date End Date Taking? Authorizing Provider  aspirin 81 MG tablet Take 162 mg by mouth daily.     Historical Provider, MD  brinzolamide (AZOPT) 1 % ophthalmic suspension Place 1 drop into the left eye every morning.      Historical Provider, MD  budesonide (PULMICORT FLEXHALER) 180 MCG/ACT inhaler Inhale 1 puff into the lungs 2 (two) times daily.      Historical Provider, MD  ezetimibe (ZETIA) 10 MG tablet Take 10 mg by mouth daily.     Historical Provider, MD  levofloxacin (LEVAQUIN) 500 MG tablet Take 500 mg by mouth daily. Take for 7 days.  Last dose 09/19/2011.     Historical Provider, MD  lisinopril (PRINIVIL,ZESTRIL) 20 MG tablet Take 20 mg by mouth daily.      Historical Provider, MD  metFORMIN (GLUCOPHAGE) 500 MG tablet Take 500 mg by mouth 2 (two) times daily with a meal.      Historical Provider, MD  predniSONE (DELTASONE) 20 MG tablet Take 10 mg by mouth daily.      Historical Provider, MD  Tamsulosin HCl (FLOMAX) 0.4 MG CAPS Take 0.4 mg by mouth daily.      Historical Provider, MD  travoprost, benzalkonium, (TRAVATAN) 0.004 % ophthalmic solution Place 1 drop into both eyes at bedtime.     Historical Provider, MD    Constitutional:  Denies F/C/S or unexpected weight change. HEENT:  No HA, visual changes, earache, nasal symptoms, sore throat, hoarseness. Resp:  Dry cough, no sputum, no hemoptysis; +SOB/ tightness/ wheezing. Cardio:  No CP, palpit, orthopnea, edema. GI:  Denies N/V/D/C or blood in stool; no reflux, abd pain, distention, or gas. GU:  No dysuria, +freq, urgency, hematuria, or flank pain. MS:  Denies joint pain, swelling, tenderness; no neck pain, back pain, etc. Neuro:  No tremors, seizures, dizziness, syncope, weakness, numbness, gait abn. Skin:  No suspicious lesions or skin rash. Heme:  No adenopathy, bruising, bleeding. Psyche: Denies confusion,  sleep disturbance, hallucinations, anxiety, depression.   Physical exam: Vital Signs:  Reviewed...  General:  WD, Thin, 77 y/o WM in mild resp distress; alert & oriented; pleasant & cooperative... HEENT:  [place]/AT; Conjunctiva- pink, Sclera- nonicteric, EOM-wnl, PERRLA, EACs-clear, TMs-wnl; NOSE-clear; THROAT- edent, clear & wnl. Neck:  Supple w/ fair ROM; no JVD; normal carotid impulses w/o bruits; no lymphadenopathy. Chest:  Decr BS bilat w/ few rhonchi & prolonged exp phase; no signs of consolid Heart:  Regular Rhythm tachy, Gr1/6 SEM, no rubs or gallops detected. Abdomen:  Soft & nontender- no guarding or rebound; normal bowel sounds; no organomegaly, palp Ao graft... Ext:  Normal ROM; mild arthritic changes; no varicose veins, +venous insuffic, no edema;  Pulses intact w/o bruits heard. Neuro:  CNs II-XII intact; motor testing normal; sensory testing normal; no focal neuro deficits. Derm:  No lesions noted; no rash etc. Lymph:  No cervical, supraclavicular, axillary, or inguinal adenopathy palpated.   LAB RESULT Lab Results  Component Value Date   CREATININE 0.80 09/19/2011   BUN 24* 09/19/2011   NA 135 09/19/2011   K 4.1 09/19/2011   CL 96 09/19/2011   CO2 31 09/19/2011   Lab Results  Component Value Date   WBC 16.7* 09/19/2011   HGB 17.4* 09/19/2011   HCT 49.3 09/19/2011   MCV 92.1 09/19/2011   PLT 267 09/19/2011   Lab Results  Component Value Date   ALT 15 09/19/2011   AST 12 09/19/2011   ALKPHOS 81 09/19/2011   BILITOT 0.5 09/19/2011   Lab Results  Component Value Date   INR 1.07 09/19/2011   INR 0.94 10/13/2010    ABGs:  12/16 in ER> pH 7.38, pCO2 45, pO2 108 on [place] O2...  Imaging studies: CXR 12/16> Findings: The lungs are hyperinflated. There are the [facility] changes especially at the apices. Heart size is normal. Crowded markings are identified at the bases. There is question of asymmetric infiltrate at the right lung base versus chronic changes. Degenerative  changes are seen in the spine.  IMPRESSION: 1. Marked emphysema. 2. Question of superimposed right lower lobe infiltrate.     Assessment and Plan: 1.  Severe COPD w/ acute exac;  Acute on chronic hypoxemic resp failure>> ~ He is on Bipap in the ER & we will admit to PCCM set down unit ~ IV Solumedrol, IV Levaquin, NEBS, and close observation ~ Pt is a full code  2.  HBP- on Lisinopril 20mg /d; continue same for now as he has demonstrated good tolerance to the ACE rx.  3.  CAD- s/p & several PTCAs by DrHSmith> on ASA 81mg /d & we will continue this & monitor his cardiac status. ~  BNP= 164 and initial cardiac enz are neg.Marland KitchenMarland Kitchen  4.  ASPVD- s/p endovasc repair of 5.6cm infrarenal AAA by DrCDickson 1/12...  5.  Hyperlipidemia- on Zetia & intol to statins...  6.  DM- on Metformin 500mg  Bid as outpt, we will cover w/ SSI in patient...  7.  Hx Hypertyroidism/ Goiter> details of this hx are unknown at present...  8.  Hx BPH, Bladder calculi, & recurrent UTIs; on Tamsulosin 0.4mg /d> s/p remote TURP, and more recent transurethral vaporization of the prostate by DrBorden 3/12  9.  Glaucoma on Travatan 0.004 % 1gtt ou Qhs & AZOPT 1 % 1gtt left eye Qam  10.  Best practice>  Lovenox, Protonix, FloraQ   Jayr Lupercio M 09/19/2011, 5:00 PM

## 2011-09-19 NOTE — ED Notes (Signed)
Gave patient a urinal and he attempted to provide specimen, however he was unable at this time.  Will check back in a few minutes.

## 2011-09-19 NOTE — ED Provider Notes (Addendum)
History     CSN: [id number] Arrival date & time: 09/19/2011 12:40 PM   First MD Initiated Contact with Patient 09/19/11 1245      Chief Complaint  Patient presents with  . Shortness of Breath  . Respiratory Distress    (Consider location/radiation/quality/duration/timing/severity/associated sxs/prior treatment) HPI Comments: Me [facility] arrived they gave the patient albuterol, Atrovent, Solu-Medrol. Due to him not moving any air and not getting any better he was given IM epinephrine per their protocol. On arrival here he feels much better he is still short of breath but he states it's much improved. Persistent cough that is productive over the last week and shortness of breath. He saw his doctor last week and was given prednisone, Levaquin which he has been taking daily and is getting worse.  Patient is a 77 y.o. male presenting with shortness of breath. The history is provided by the patient and the EMS personnel.  Shortness of Breath  The current episode started more than 1 week ago. The onset was gradual. The problem occurs continuously. The problem has been rapidly worsening. The problem is severe. The symptoms are relieved by nothing. The symptoms are aggravated by activity. Associated symptoms include rhinorrhea, cough, shortness of breath and wheezing. Pertinent negatives include no chest pain and no fever. The cough is productive. He is currently using steroids. He has had no prior ICU admissions. He has had no prior intubations. His past medical history is significant for past wheezing. He has been behaving normally. There were no sick contacts. Services received include medications given.    Past Medical History  Diagnosis Date  . Diabetes mellitus   . Hyperlipidemia   . Myocardial infarction   . COPD (chronic obstructive pulmonary disease)   . Emphysema of lung   . Abdominal aneurysm without mention of rupture   . Thyroid disease     Hyperthyroidism, Goiter    Past Surgical  History  Procedure Date  . Hernia repair     LEFT INGUINAL  . Abdominal aorta stent     ENDOSTENT REPAIR 10/15/2010  . Prostate surgery   . Abdominal aortic aneurysm repair 05/26/11    PEVAR    History reviewed. No pertinent family history.  History  Substance Use Topics  . Smoking status: Former Smoker -- 1.5 packs/day    Quit date: 10/04/2004  . Smokeless tobacco: Never Used  . Alcohol Use: No      Review of Systems  Constitutional: Negative for fever.  HENT: Positive for rhinorrhea.   Respiratory: Positive for cough, shortness of breath and wheezing.   Cardiovascular: Negative for chest pain.  All other systems reviewed and are negative.    Allergies  Statins  Home Medications   Current Outpatient Rx  Name Route Sig Dispense Refill  . ASPIRIN 81 MG PO TABS Oral Take 81 mg by mouth 2 (two) times daily.      Marland Kitchen BRIMONIDINE TARTRATE 0.15 % OP SOLN  1 drop 3 (three) times daily.      . BUDESONIDE 180 MCG/ACT IN AEPB Inhalation Inhale 1 puff into the lungs 2 (two) times daily.      Marland Kitchen VITAMIN D 1000 UNITS PO TABS Oral Take 1,000 Units by mouth daily.      Marland Kitchen EZETIMIBE 10 MG PO TABS Oral Take 10 mg by mouth daily.      Marland Kitchen LISINOPRIL 20 MG PO TABS Oral Take 20 mg by mouth daily.      Marland Kitchen METFORMIN HCL ER 500  MG PO TB24 Oral Take 500 mg by mouth 2 (two) times daily.      Marland Kitchen TAMSULOSIN HCL 0.4 MG PO CAPS Oral Take 0.4 mg by mouth daily.      . TRAVOPROST 0.004 % OP SOLN  1 drop at bedtime.        BP 132/85  Pulse 130  Temp(Src) 98 F (36.7 C) (Rectal)  Resp 36  SpO2 98%  Physical Exam  Nursing note and vitals reviewed. Constitutional: He is oriented to person, place, and time. He appears well-developed. He appears cachectic. He appears distressed.  HENT:  Head: Normocephalic and atraumatic.  Mouth/Throat: Mucous membranes are dry.  Eyes: Conjunctivae and EOM are normal. Pupils are equal, round, and reactive to light.  Neck: Normal range of motion. Neck supple.    Cardiovascular: Regular rhythm and intact distal pulses.  Tachycardia present.   No murmur heard. Pulmonary/Chest: He is in respiratory distress. He has decreased breath sounds in the right upper field, the right middle field, the right lower field, the left upper field, the left middle field and the left lower field. He has wheezes in the right upper field, the right middle field, the right lower field, the left upper field and the left middle field. He has rhonchi in the right upper field, the right middle field, the right lower field, the left upper field, the left middle field and the left lower field. He has no rales.  Abdominal: Soft. He exhibits no distension. There is no tenderness. There is no rebound and no guarding.  Musculoskeletal: Normal range of motion. He exhibits no edema and no tenderness.  Neurological: He is alert and oriented to person, place, and time.  Skin: Skin is warm and dry. No rash noted. No erythema.  Psychiatric: He has a normal mood and affect. His behavior is normal.    ED Course  Procedures (including critical care time)  Labs Reviewed  COMPREHENSIVE METABOLIC PANEL - Abnormal; Notable for the following:    Glucose, Bld 130 (*)    BUN 24 (*)    GFR calc non Af Amer 83 (*)    All other components within normal limits  CBC - Abnormal; Notable for the following:    WBC 16.7 (*)    Hemoglobin 17.4 (*)    All other components within normal limits  DIFFERENTIAL - Abnormal; Notable for the following:    Neutrophils Relative 79 (*)    Neutro Abs 13.2 (*)    All other components within normal limits  POCT I-STAT 3, BLOOD GAS (G3+) - Abnormal; Notable for the following:    pO2, Arterial 108.0 (*)    Bicarbonate 26.2 (*)    All other components within normal limits  CARDIAC PANEL(CRET KIN+CKTOT+MB+TROPI)  PRO B NATRIURETIC PEPTIDE  PROTIME-INR  APTT  LACTIC ACID, PLASMA  URINALYSIS, ROUTINE W REFLEX MICROSCOPIC  CULTURE, BLOOD (ROUTINE X 2)  CULTURE,  BLOOD (ROUTINE X 2)  BLOOD GAS, ARTERIAL   Dg Chest Port 1 View  09/19/2011  *RADIOLOGY REPORT*  Clinical Data: Shortness of breath.  Difficulty breathing.  PORTABLE CHEST - 1 VIEW  Comparison: 10/15/2010  Findings: The lungs are hyperinflated.  There are the [facility] changes especially at the apices.  Heart size is normal.  Crowded markings are identified at the bases. There is question of asymmetric infiltrate at the right lung base versus chronic changes.  Degenerative changes are seen in the spine.  IMPRESSION:  1.  Marked emphysema. 2. Question of superimposed  right lower lobe infiltrate.  Original Report Authenticated By: Patterson Hammersmith, M.D.    Date: 09/19/2011  Rate: 114  Rhythm: sinus tachycardia  QRS Axis: normal  Intervals: normal  ST/T Wave abnormalities: nonspecific ST changes and acute myocardial infarction  Conduction Disutrbances:right bundle branch block  Narrative Interpretation:   Old EKG Reviewed: unchanged  CRITICAL CAREis of the Performed by: Alyviah Crandle   Total critical care time: 30  Critical care time was exclusive of separately billable procedures and treating other patients.  Critical care was necessary to treat or prevent imminent or life-threatening deterioration.  Critical care was time spent personally by me on the following activities: development of treatment plan with patient and/or surrogate as well as nursing, discussions with consultants, evaluation of patient's response to treatment, examination of patient, obtaining history from patient or surrogate, ordering and performing treatments and interventions, ordering and review of laboratory studies, ordering and review of radiographic studies, pulse oximetry and re-evaluation of patient's condition.   No diagnosis found.    MDM   Patient with a history of COPD who saw his doctor last week and was started on prednisone and Levaquin for productive cough and shortness of breath. He was  taking his last antibiotic today and his shortness of breath had been worsening to the point that today he was unable to breathe and called 911. On arrival EMS found him to have oxygen saturations of 70% on room air. He was given albuterol, Atrovent, Solu-Medrol, IM epinephrine due to shortness of breath and concern for COPD exacerbation. On arrival here he states he is improved he has diffuse wheezing and decreased breath sounds with tachycardia which I feel the tachycardia is most likely from the epinephrine.  Patient was afebrile rectally and BiPAP was started. ABG was within normal limits other than mild compensated hypercarbia. Patient did not tolerate BiPAP mask and was discontinued. He was placed on nasal cannula and his sats remained above 90% this is persistent mild accessory muscle use. The patient states he feels much better. Chest x-ray with concern for new infiltrates with a leukocytosis of 16,000. BNP within normal limits and BMP are within normal as. Cardiac enzymes negative. Patient given fluid bolus and continued albuterol and Atrovent. Given Rocephin and azithromycin IV as he is CAP. Will admit for further care. Blood cultures drawn.  3:24 PM Spoke with critical care about the patient due to concern of deterioration. They will come and see the patient.  Gwyneth Sprout, MD 09/19/11 1524  4:08 PM Patient had to be placed back on BiPAP to to tachypnea and shortness of breath. Seems to be tolerating it better now.  Gwyneth Sprout, MD 09/19/11 [phone]

## 2011-09-19 NOTE — ED Notes (Signed)
Pt is agitated and very restless at this time.

## 2011-09-19 NOTE — ED Notes (Signed)
Pt has hx of copd. Upon ems arrival he was in respiratory distress. Wheezing heard along with diminished lung sounds. Pt was given 10mg  albuterol, atrovent, 125mg  solumedrol, sq epi with improvement. Pt states that his shortness of breath has been getting worse over the past week. Upon arrival pt is able to speak but not in complete sentances. Pt was recently seen by pcp at [place] and was started on levaquin, prednisone, and pro air inhaler. Pt states that he has not been getting any better.

## 2011-09-19 NOTE — ED Notes (Signed)
Wife and Son report that they have to go home. Wife is in a lot of pain. Family request admitting Dr to call with plan of care. Family can be reached at [phone]. [name] and [name].

## 2011-09-20 ENCOUNTER — Inpatient Hospital Stay (HOSPITAL_COMMUNITY): Payer: Medicare Other

## 2011-09-20 LAB — BLOOD GAS, ARTERIAL
Bicarbonate: 26.8 mEq/L — ABNORMAL HIGH (ref 20.0–24.0)
O2 Saturation: 91.9 %
Patient temperature: 98.7
TCO2: 28.2 mmol/L (ref 0–100)

## 2011-09-20 LAB — COMPREHENSIVE METABOLIC PANEL
ALT: 14 U/L (ref 0–53)
Alkaline Phosphatase: 71 U/L (ref 39–117)
CO2: 27 mEq/L (ref 19–32)
GFR calc Af Amer: >90 mL/min (ref >90)
GFR calc non Af Amer: 90 mL/min — ABNORMAL LOW (ref >90)
Glucose, Bld: 288 mg/dL — ABNORMAL HIGH (ref 70–99)
Potassium: 4.9 mEq/L (ref 3.5–5.1)
Sodium: 131 mEq/L — ABNORMAL LOW (ref 135–145)
Total Protein: 6.2 g/dL (ref 6.0–8.3)

## 2011-09-20 LAB — DIFFERENTIAL
Eosinophils Absolute: 0 10*3/uL (ref 0.0–0.7)
Eosinophils Relative: 0 % (ref 0–5)
Lymphs Abs: 1.1 10*3/uL (ref 0.7–4.0)
Monocytes Absolute: 1.1 10*3/uL — ABNORMAL HIGH (ref 0.1–1.0)

## 2011-09-20 LAB — GLUCOSE, CAPILLARY
Glucose-Capillary: 131 mg/dL — ABNORMAL HIGH (ref 70–99)
Glucose-Capillary: 153 mg/dL — ABNORMAL HIGH (ref 70–99)
Glucose-Capillary: 256 mg/dL — ABNORMAL HIGH (ref 70–99)
Glucose-Capillary: 275 mg/dL — ABNORMAL HIGH (ref 70–99)

## 2011-09-20 LAB — PROCALCITONIN: Procalcitonin: <0.1 ng/mL

## 2011-09-20 LAB — CBC
HCT: 45.7 % (ref 39.0–52.0)
Hemoglobin: 15.9 g/dL (ref 13.0–17.0)
MCH: 32.1 pg (ref 26.0–34.0)
MCHC: 34.8 g/dL (ref 30.0–36.0)

## 2011-09-20 LAB — TSH: TSH: 0.464 u[IU]/mL (ref 0.350–4.500)

## 2011-09-20 MED ORDER — ENSURE CLINICAL ST REVIGOR PO LIQD
237.0000 mL | Freq: Three times a day (TID) | ORAL | Status: DC
  Administered 2011-09-20 – 2011-09-21 (×2): 237 mL via ORAL
  Filled 2011-09-20 (×2): qty 237

## 2011-09-20 MED ORDER — INSULIN ASPART 100 UNIT/ML ~~LOC~~ SOLN
0.0000 [IU] | SUBCUTANEOUS | Status: DC
  Administered 2011-09-20: 2 [IU] via SUBCUTANEOUS
  Administered 2011-09-20: 11 [IU] via SUBCUTANEOUS
  Administered 2011-09-20: 3 [IU] via SUBCUTANEOUS
  Administered 2011-09-21 (×2): 5 [IU] via SUBCUTANEOUS
  Administered 2011-09-21: 11 [IU] via SUBCUTANEOUS
  Administered 2011-09-21: 3 [IU] via SUBCUTANEOUS
  Administered 2011-09-21: 5 [IU] via SUBCUTANEOUS
  Administered 2011-09-21: 3 [IU] via SUBCUTANEOUS
  Administered 2011-09-22 (×2): 8 [IU] via SUBCUTANEOUS
  Administered 2011-09-22: 5 [IU] via SUBCUTANEOUS
  Administered 2011-09-22: 8 [IU] via SUBCUTANEOUS
  Administered 2011-09-22 – 2011-09-23 (×2): 3 [IU] via SUBCUTANEOUS
  Administered 2011-09-23: 2 [IU] via SUBCUTANEOUS
  Administered 2011-09-23: 3 [IU] via SUBCUTANEOUS
  Administered 2011-09-23: 5 [IU] via SUBCUTANEOUS
  Administered 2011-09-24: 2 [IU] via SUBCUTANEOUS
  Administered 2011-09-24 – 2011-09-25 (×3): 5 [IU] via SUBCUTANEOUS
  Administered 2011-09-25: 3 [IU] via SUBCUTANEOUS
  Administered 2011-09-25: 8 [IU] via SUBCUTANEOUS
  Administered 2011-09-26 (×2): 5 [IU] via SUBCUTANEOUS
  Administered 2011-09-26: 2 [IU] via SUBCUTANEOUS
  Filled 2011-09-20: qty 3

## 2011-09-20 NOTE — Progress Notes (Signed)
Inpatient Diabetes Program Recommendations  AACE/ADA: New Consensus Statement on Inpatient Glycemic Control (2009)  Target Ranges:  Prepandial:   less than 140 mg/dL      Peak postprandial:   less than 180 mg/dL (1-2 hours)      Critically ill patients:  140 - 180 mg/dL   Reason for Visit: Hyperglycemia  Inpatient Diabetes Program Recommendations Correction (SSI): Please consider ordering meal coverage Insulin - Meal Coverage: Please consider ordering meal coverage HgbA1C: Consider Hgb A1C  Note: On Solu-medrol. Lab glucose this am 288.  CBG before breakfast 256

## 2011-09-20 NOTE — Progress Notes (Signed)
Name: Joseph [name] MRN: [id number] DOB: 1930/02/22    LOS: 1  PCCM PROGRESS NOTE  History of Present Illness: 77 yo ex smoker with COPD recently treated for URI with Levaquin and brought to ED on 12/16 with respiratory distress and hypoxemia.  Lines / Drains: None  Cultures: 12/16  BC>>>  Antibiotics: 12/16  Levaquin>>>12/17 12/16  Ceftriaxone>>> 12/16  Azithromycin>>>  Tests / Events: 12/17  Reports significant dyspnea>>>back on BiPAP  Vital Signs: Temp:  [97.9 F (36.6 C)-98.7 F (37.1 C)] 98.7 F (37.1 C) (12/17 0800) Pulse Rate:  [91-130] 91  (12/17 0800) Resp:  [16-36] 18  (12/17 0800) BP: (99-149)/(66-85) 149/84 mmHg (12/17 0800) SpO2:  [92 %-98 %] 94 % (12/17 0833) FiO2 (%):  [28 %] 28 % (12/17 0833) Weight:  [61 kg (134 lb 7.7 oz)-61.8 kg (136 lb 3.9 oz)] 136 lb 3.9 oz (61.8 kg) (12/17 0432) I/O last 3 completed shifts: In: -  Out: 500 [Urine:500]  Physical Examination: General:  Moderate respiratory distress, cannot speak full sentences Neuro:  Awake, alert, coopertaive   HEENT:  PERRL Neck:  No JVD   Cardiovascular:  RRR, no murmurs Lungs:  Prolonged expiratory phase, diminished bilateral air entry, scattered rhonchi Abdomen:  Soft, nontender Musculoskeletal:  No pedal edema Skin:  No rash  Ventilator settings: Vent Mode:  [-]  FiO2 (%):  [28 %] 28 %  Labs and Imaging:  Reviewed.  Please refer to the Assessment and Plan section for relevant results.  Assessment and Plan:  Acute exacerbation of COPD -->Solu-Medrol -->bronchodilators  Pneumonia (CAP), failed outpatient Levaquin.  Improving WBC, afebrile.  CXR today>>>improved aeration R base.  Lab 09/20/11 0415 09/19/11 1244  WBC 13.2* 16.7*   -->Ceftriaxone -->Azithromucin -->check PCT, urine Strep/[place] Ag  Acute respiratory failure  Lab 09/20/11 0418 09/19/11 1342  PHART 7.369 7.375  PCO2ART 47.6* 44.8  PO2ART 66.6* 108.0*  HCO3 26.8* 26.2*  TCO2 28.2 28  O2SAT 91.9  98.0   -->BiPAP PRN per RT  HTN/CAD/Dyslipidemia  -->ASA,  Zetia, Lisinopril  Diabetes mellitus, exacerbated by systemic steroids, suboptimal control  Lab 09/20/11 0857 09/20/11 0023  GLUCAP 256* 275*   -->Metformin  -->start SSI  Best practices / Disposition -->SDU status under PCCM -->full code -->Lovenox for DVT Px -->GI Px is not indicated -->NPO -->family updated at bedside  Orlean Bradford, M.D. Pulmonary and Critical Care Medicine [facility] Cell: [phone] Pager: [phone]  09/20/2011, 11:37 AM

## 2011-09-20 NOTE — Progress Notes (Signed)
INITIAL ADULT NUTRITION ASSESSMENT Date: 09/20/2011   Time: 12:30 PM  Reason for Assessment: Nutrition Risk Report (unintentional weight loss)  ASSESSMENT: Male 77 y.o.  Dx: COPD exacerbation  Patient Active Problem List  Diagnoses  . COPD (chronic obstructive pulmonary disease)  . Acute-on-chronic respiratory failure  . CAD (coronary artery disease), native coronary artery  . Peripheral vascular disease    Hx:  Past Medical History  Diagnosis Date  . Diabetes mellitus   . Hyperlipidemia   . Myocardial infarction   . COPD (chronic obstructive pulmonary disease)   . Emphysema of lung   . Abdominal aneurysm without mention of rupture   . Thyroid disease     Hyperthyroidism, Goiter    Related Meds:  Scheduled Meds:   . albuterol  2.5 mg Nebulization Q4H  . albuterol  5 mg Nebulization Once  . albuterol      . aspirin EC  81 mg Oral Daily  . azithromycin  500 mg Intravenous Once  . azithromycin  500 mg Intravenous Daily  . cefTRIAXone (ROCEPHIN)  IV  1 g Intravenous Once  . cefTRIAXone (ROCEPHIN)  IV  1 g Intravenous Daily  . dorzolamide  1 drop Left Eye QAC breakfast  . enoxaparin  40 mg Subcutaneous Daily  . EPINEPHrine      . ezetimibe  10 mg Oral Daily  . insulin aspart  0-15 Units Subcutaneous Q4H  . ipratropium  0.5 mg Nebulization Once  . ipratropium  0.5 mg Nebulization Once  . ipratropium  0.5 mg Nebulization Q4H  . levalbuterol  1.25 mg Nebulization Once  . lisinopril  20 mg Oral Daily  . metFORMIN  500 mg Oral BID WC  . methylPREDNISolone (SOLU-MEDROL) injection  60 mg Intravenous Q12H  . methylPREDNISolone sodium succinate      .  morphine injection  2 mg Intravenous Once  .  morphine injection  2 mg Intravenous Once  . sodium chloride  500 mL Intravenous Once  . Tamsulosin HCl  0.4 mg Oral Daily  . Travoprost (BAK Free)  1 drop Both Eyes QHS  . DISCONTD: albuterol  2.5 mg Nebulization Q6H  . DISCONTD: azithromycin  500 mg Intravenous Q24H  .  DISCONTD: brinzolamide  1 drop Left Eye Q0700  . DISCONTD: cefTRIAXone (ROCEPHIN)  IV  1 g Intravenous Q12H  . DISCONTD: enoxaparin  30 mg Subcutaneous Q24H  . DISCONTD: ipratropium  0.5 mg Nebulization Q4H  . DISCONTD: lisinopril  20 mg Oral Daily  . DISCONTD: metFORMIN  500 mg Oral BID WC  . DISCONTD: methylPREDNISolone (SOLU-MEDROL) injection  80 mg Intravenous Q12H  . DISCONTD: Tamsulosin HCl  0.4 mg Oral PC supper   Continuous Infusions:   . sodium chloride    . dextrose 5 % and 0.45 % NaCl with KCl 20 mEq/L 50 mL/hr at 09/20/11 0600  . DISCONTD: sodium chloride 125 mL/hr at 09/19/11 2134   PRN Meds:.sodium chloride, acetaminophen, acetaminophen, morphine injection   Ht: 5\' 11"  (180.3 cm)  Wt: 134 lb. (61 kg) on admission  Ideal Wt: 78.2 kg % Ideal Wt: 78%  Usual Wt: unknown  BMI=18.8  Food/Nutrition Related Hx: Patient is receiving BiPap, resting at this time, unable to obtain any nutrition hx.  According to Nutrition Screen, patient with unintentional weight loss greater than 10 lbs within the last month.  Labs:  CMP     Component Value Date/Time   NA 131* 09/20/2011 0415   K 4.9 09/20/2011 0415   CL 95*  09/20/2011 0415   CO2 27 09/20/2011 0415   GLUCOSE 288* 09/20/2011 0415   BUN 23 09/20/2011 0415   CREATININE 0.66 09/20/2011 0415   CALCIUM 9.2 09/20/2011 0415   PROT 6.2 09/20/2011 0415   ALBUMIN 3.3* 09/20/2011 0415   AST 12 09/20/2011 0415   ALT 14 09/20/2011 0415   ALKPHOS 71 09/20/2011 0415   BILITOT 0.3 09/20/2011 0415   GFRNONAA 90* 09/20/2011 0415   GFRAA >90 09/20/2011 0415    CBG (last 3)   Basename 09/20/11 1227 09/20/11 0857 09/20/11 0023  GLUCAP 339* 256* 275*     Intake/Output Summary (Last 24 hours) at 09/20/11 1238 Last data filed at 09/20/11 1200  Gross per 24 hour  Intake      0 ml  Output   1150 ml  Net  -1150 ml     Diet Order: CHO-modified medium  IVF:    sodium chloride   dextrose 5 % and 0.45 % NaCl with KCl 20  mEq/L Last Rate: 50 mL/hr at 09/20/11 0600  DISCONTD: sodium chloride Last Rate: 125 mL/hr at 09/19/11 2134    Estimated Nutritional Needs:   Kcal: 1850-2150 kcals Protein: 85-100 grams Fluid: 1.8-2.1 liters  Patient with poor oral intake due to difficulty breathing and BiPap requirement.  NUTRITION DIAGNOSIS: -Malnutrition (NI-5.2).  Status: Ongoing  RELATED TO: suboptimal oral intake and increased energy expenditure (due to COPD)  AS EVIDENCED BY: BMI=18.8 and visible subcutaneous fat loss.  MONITORING/EVALUATION(Goals): Oral intake to meet nutrition needs to promote repletion of nutrition stores as able. Monitor PO intake, labs, weight trend.  EDUCATION NEEDS: -Education not appropriate at this time  INTERVENTION: Add Ensure Clinical Strength PO TID to maximize oral intake.  Dietitian #:  [phone]  DOCUMENTATION CODES Per approved criteria  -Severe malnutrition in the context of chronic illness -Underweight    Hettie Holstein 09/20/2011, 12:30 PM

## 2011-09-21 ENCOUNTER — Inpatient Hospital Stay (HOSPITAL_COMMUNITY): Payer: Medicare Other

## 2011-09-21 DIAGNOSIS — J438 Other emphysema: Secondary | ICD-10-CM

## 2011-09-21 DIAGNOSIS — E119 Type 2 diabetes mellitus without complications: Secondary | ICD-10-CM

## 2011-09-21 DIAGNOSIS — I2510 Atherosclerotic heart disease of native coronary artery without angina pectoris: Secondary | ICD-10-CM

## 2011-09-21 DIAGNOSIS — J9620 Acute and chronic respiratory failure, unspecified whether with hypoxia or hypercapnia: Secondary | ICD-10-CM

## 2011-09-21 LAB — MAGNESIUM: Magnesium: 1.9 mg/dL (ref 1.5–2.5)

## 2011-09-21 LAB — GLUCOSE, CAPILLARY
Glucose-Capillary: 121 mg/dL — ABNORMAL HIGH (ref 70–99)
Glucose-Capillary: 244 mg/dL — ABNORMAL HIGH (ref 70–99)

## 2011-09-21 LAB — BASIC METABOLIC PANEL
CO2: 30 mEq/L (ref 19–32)
Calcium: 9.1 mg/dL (ref 8.4–10.5)
Glucose, Bld: 187 mg/dL — ABNORMAL HIGH (ref 70–99)
Potassium: 4.7 mEq/L (ref 3.5–5.1)
Sodium: 131 mEq/L — ABNORMAL LOW (ref 135–145)

## 2011-09-21 LAB — CBC
Hemoglobin: 14.6 g/dL (ref 13.0–17.0)
MCH: 31.5 pg (ref 26.0–34.0)
Platelets: 261 10*3/uL (ref 150–400)
RBC: 4.64 MIL/uL (ref 4.22–5.81)
WBC: 19.5 10*3/uL — ABNORMAL HIGH (ref 4.0–10.5)

## 2011-09-21 LAB — PHOSPHORUS: Phosphorus: 3.2 mg/dL (ref 2.3–4.6)

## 2011-09-21 MED ORDER — ALPRAZOLAM 0.25 MG PO TABS
0.2500 mg | ORAL_TABLET | Freq: Three times a day (TID) | ORAL | Status: DC | PRN
  Administered 2011-09-21 – 2011-09-30 (×3): 0.25 mg via ORAL
  Filled 2011-09-21 (×4): qty 1

## 2011-09-21 NOTE — Progress Notes (Signed)
Name: Joseph [name] MRN: [id number] DOB: Mar 30, 1930    LOS: 2  PCCM PROGRESS NOTE  History of Present Illness: 77 yo ex smoker with COPD recently treated for URI with Levaquin and brought to ED on 12/16 with respiratory distress and hypoxemia.  Lines / Drains: None  Cultures: 12/16  BC>>>ntd  Antibiotics: 12/16  Levaquin>>>12/17 12/16  Ceftriaxone>>> 12/16  Azithromycin>>>  Tests / Events: 12/17  Reports significant dyspnea>>>back on BiPAP 12/18  Somewhat anxious, otherwise reports breathing being better  Vital Signs: Temp:  [97.5 F (36.4 C)-98.4 F (36.9 C)] 97.8 F (36.6 C) (12/18 0748) Pulse Rate:  [87-118] 104  (12/18 1006) Resp:  [14-38] 26  (12/18 1006) BP: (91-153)/(44-81) 111/53 mmHg (12/18 1006) SpO2:  [92 %-100 %] 97 % (12/18 1006) FiO2 (%):  [36 %-40 %] 36 % (12/18 0831) Weight:  [61.2 kg (134 lb 14.7 oz)] 134 lb 14.7 oz (61.2 kg) (12/18 0700) I/O last 3 completed shifts: In: 1456.7 [I.V.:1456.7] Out: 2450 [Urine:2450]  Physical Examination: General:  No distress, appears anxious Neuro:  Nonfocal HEENT:  PERRL Neck:  No JVD   Cardiovascular:  RRR, no murmurs Lungs:  Diminished bilateral air entry, no added breath sounds Abdomen:  Soft, nontender Musculoskeletal:  No pedal edema Skin:  No rash  Ventilator settings: Vent Mode:  [-]  FiO2 (%):  [36 %-40 %] 36 %  Labs and Imaging:  Reviewed.  Please refer to the Assessment and Plan section for relevant results.  Assessment and Plan:  Anxiety -->start Xanax PRN  Acute exacerbation of COPD -->change Solu-Medrol to Prednisone 40 daily -->bronchodilators  Suspected Pneumonia (CAP), failed Levaquin as outpatient.   CXR today >>>increased RLL density.  PCT < 0.10.  Lab 09/21/11 0405 09/20/11 0415 09/19/11 1244  WBC 19.5* 13.2* 16.7*   -->Ceftriaxone -->Azithromucin -->Urine Strep/[place] Ag pending   Acute respiratory failure  Lab 09/20/11 0418 09/19/11 1342  PHART 7.369 7.375    PCO2ART 47.6* 44.8  PO2ART 66.6* 108.0*  HCO3 26.8* 26.2*  TCO2 28.2 28  O2SAT 91.9 98.0   -->BiPAP PRN per RT  HTN/CAD/Dyslipidemia  -->ASA,  Zetia, Lisinopril  Diabetes mellitus, exacerbated by systemic steroids, suboptimal control  Lab 09/21/11 0347 09/20/11 2340 09/20/11 1953 09/20/11 1535 09/20/11 1227  GLUCAP 183* 121* 131* 153* 339*   -->Metformin  -->SSI -->decrease systemic steroids as above  Best practices / Disposition -->SDU status under PCCM -->full code -->Lovenox for DVT Px -->GI Px is not indicated -->Start diet as decreased risk for intubation -->family updated at bedside  Orlean Bradford, M.D. Pulmonary and Critical Care Medicine [facility] Cell: [phone] Pager: [phone]  09/21/2011, 10:32 AM

## 2011-09-22 ENCOUNTER — Other Ambulatory Visit (HOSPITAL_COMMUNITY): Payer: Self-pay | Admitting: Respiratory Therapy

## 2011-09-22 ENCOUNTER — Encounter (HOSPITAL_COMMUNITY): Payer: Medicare Other

## 2011-09-22 DIAGNOSIS — R0902 Hypoxemia: Secondary | ICD-10-CM

## 2011-09-22 LAB — BASIC METABOLIC PANEL
GFR calc Af Amer: >90 mL/min (ref >90)
GFR calc non Af Amer: 85 mL/min — ABNORMAL LOW (ref >90)
Potassium: 4.9 mEq/L (ref 3.5–5.1)
Sodium: 134 mEq/L — ABNORMAL LOW (ref 135–145)

## 2011-09-22 LAB — GLUCOSE, CAPILLARY: Glucose-Capillary: 270 mg/dL — ABNORMAL HIGH (ref 70–99)

## 2011-09-22 LAB — CBC
MCHC: 33.5 g/dL (ref 30.0–36.0)
RDW: 13.5 % (ref 11.5–15.5)
WBC: 21.8 10*3/uL — ABNORMAL HIGH (ref 4.0–10.5)

## 2011-09-22 LAB — PHOSPHORUS: Phosphorus: 3.1 mg/dL (ref 2.3–4.6)

## 2011-09-22 LAB — LEGIONELLA ANTIGEN, URINE: Legionella Antigen, Urine: NEGATIVE

## 2011-09-22 LAB — MAGNESIUM: Magnesium: 2.1 mg/dL (ref 1.5–2.5)

## 2011-09-22 MED ORDER — IPRATROPIUM BROMIDE 0.02 % IN SOLN
0.5000 mg | RESPIRATORY_TRACT | Status: DC
  Administered 2011-09-22 – 2011-09-26 (×25): 0.5 mg via RESPIRATORY_TRACT
  Filled 2011-09-22 (×26): qty 2.5

## 2011-09-22 MED ORDER — INSULIN GLARGINE 100 UNIT/ML ~~LOC~~ SOLN
10.0000 [IU] | Freq: Every day | SUBCUTANEOUS | Status: DC
  Administered 2011-09-22 – 2011-09-30 (×9): 10 [IU] via SUBCUTANEOUS
  Filled 2011-09-22: qty 3

## 2011-09-22 MED ORDER — PREDNISONE 20 MG PO TABS
40.0000 mg | ORAL_TABLET | Freq: Every day | ORAL | Status: DC
  Administered 2011-09-22 – 2011-09-26 (×5): 40 mg via ORAL
  Filled 2011-09-22 (×6): qty 2

## 2011-09-22 MED ORDER — ALBUTEROL SULFATE (5 MG/ML) 0.5% IN NEBU
2.5000 mg | INHALATION_SOLUTION | RESPIRATORY_TRACT | Status: DC
  Administered 2011-09-22 – 2011-09-26 (×25): 2.5 mg via RESPIRATORY_TRACT
  Filled 2011-09-22 (×26): qty 0.5

## 2011-09-22 MED ORDER — SENNOSIDES-DOCUSATE SODIUM 8.6-50 MG PO TABS
2.0000 | ORAL_TABLET | Freq: Two times a day (BID) | ORAL | Status: DC
  Administered 2011-09-22 – 2011-09-29 (×16): 2 via ORAL
  Administered 2011-09-30: 1 via ORAL
  Administered 2011-09-30 – 2011-10-01 (×2): 2 via ORAL
  Filled 2011-09-22 (×13): qty 2
  Filled 2011-09-22 (×2): qty 1
  Filled 2011-09-22 (×4): qty 2

## 2011-09-22 MED ORDER — LORAZEPAM 0.5 MG PO TABS
0.5000 mg | ORAL_TABLET | Freq: Two times a day (BID) | ORAL | Status: DC
  Administered 2011-09-22 – 2011-10-01 (×12): 0.5 mg via ORAL
  Filled 2011-09-22 (×15): qty 1

## 2011-09-22 NOTE — Progress Notes (Signed)
UR Completed.   Joseph [name] [phone] 09/22/2011  

## 2011-09-22 NOTE — Progress Notes (Signed)
Name: Joseph [name] MRN: [id number] DOB: 1930-01-30    LOS: 3  PCCM PROGRESS NOTE  History of Present Illness: 77 yo ex smoker with COPD recently treated for URI with Levaquin and brought to ED on 12/16 with respiratory distress and hypoxemia.  Lines / Drains: None  Cultures: 12/16  BC>>>ntd  Antibiotics: 12/16  Levaquin>>>12/17 12/16  Ceftriaxone>>> 12/16  Azithromycin>>>  Tests / Events: 12/17  Reports significant dyspnea>>>back on BiPAP 12/18  Somewhat anxious, otherwise reports breathing being better 12/19  Reports feeling better.  RN concerned about significant element of anxiety.  Vital Signs: Temp:  [97.3 F (36.3 C)-97.9 F (36.6 C)] 97.3 F (36.3 C) (12/19 0856) Pulse Rate:  [97-122] 104  (12/19 0859) Resp:  [15-28] 20  (12/19 0859) BP: (123-147)/(59-79) 123/79 mmHg (12/19 1035) SpO2:  [93 %-100 %] 96 % (12/19 0859) FiO2 (%):  [36 %] 36 % (12/18 1122) Weight:  [63.4 kg (139 lb 12.4 oz)] 139 lb 12.4 oz (63.4 kg) (12/19 0436) I/O last 3 completed shifts: In: 3158.7 [P.O.:837; I.V.:1721.7; IV Piggyback:600] Out: 1675 [Urine:1675]  Physical Examination: General:  Comfortable Neuro:  Awake, alert, cooperative, somewhat anxious, keeps asking what his oxygen level is on monitor HEENT:  PERRL Neck:  No JVD   Cardiovascular:  RRR, no murmurs Lungs:  Bilateral very decreased air entry Abdomen:  Soft, nontender, bowel sounds present Musculoskeletal:  No pedal edema Skin:  No rash  Ventilator settings: Vent Mode:  [-]  FiO2 (%):  [36 %] 36 %  Labs and Imaging:  Reviewed.  Please refer to the Assessment and Plan section for relevant results.  Assessment and Plan:  Significant anxiety which appears to interfere with normal breathing pattern -->continue Xanax PRN -->start Ativan 0.5 mg PO bid  Acute exacerbation of COPD -->d/c Solu-Medrol, start Prednisone 40 daily -->bronchodilators -->bedside spirometry as no PFTs available on record and would like to  quantify respiratory function for prognostic reasons  Suspected Pneumonia (CAP), failed Levaquin as outpatient.   CXR today >>>increased RLL density.  PCT < 0.10. Strep U Ag>>>neg.  Steroids may also account for elevated WBC.  Lab 09/22/11 0634 09/21/11 0405 09/20/11 0415 09/19/11 1244  WBC 21.8* 19.5* 13.2* 16.7*   -->Ceftriaxone -->Azithromucin -->CXR in AM  Acute respiratory failure, resolved   Lab 09/20/11 0418 09/19/11 1342  PHART 7.369 7.375  PCO2ART 47.6* 44.8  PO2ART 66.6* 108.0*  HCO3 26.8* 26.2*  TCO2 28.2 28  O2SAT 91.9 98.0   -->d/c BiPAP  HTN/CAD/Dyslipidemia  -->ASA,  Zetia, Lisinopril  Diabetes mellitus, exacerbated by systemic steroids, suboptimal control  Lab 09/22/11 0800 09/22/11 0435 09/21/11 2354 09/21/11 2030 09/21/11 1548  GLUCAP 209* 172* 220* 244* 166*   -->Metformin  -->SSI -->start Lantus 10 units qHS  Best practices / Disposition -->floor status under PCCM -->full code -->Lovenox for DVT Px -->GI Px is not indicated -->Diet -->family updated at bedside  Orlean Bradford, M.D. Pulmonary and Critical Care Medicine [facility] Cell: [phone] Pager: [phone]  09/22/2011, 11:17 AM

## 2011-09-23 ENCOUNTER — Inpatient Hospital Stay (HOSPITAL_COMMUNITY): Payer: Medicare Other

## 2011-09-23 DIAGNOSIS — I2510 Atherosclerotic heart disease of native coronary artery without angina pectoris: Secondary | ICD-10-CM

## 2011-09-23 DIAGNOSIS — E119 Type 2 diabetes mellitus without complications: Secondary | ICD-10-CM

## 2011-09-23 DIAGNOSIS — J9620 Acute and chronic respiratory failure, unspecified whether with hypoxia or hypercapnia: Secondary | ICD-10-CM

## 2011-09-23 DIAGNOSIS — J438 Other emphysema: Secondary | ICD-10-CM

## 2011-09-23 LAB — GLUCOSE, CAPILLARY
Glucose-Capillary: 157 mg/dL — ABNORMAL HIGH (ref 70–99)
Glucose-Capillary: 147 mg/dL — ABNORMAL HIGH (ref 70–99)

## 2011-09-23 LAB — CBC
MCH: 31.3 pg (ref 26.0–34.0)
Platelets: 301 10*3/uL (ref 150–400)
RBC: 4.92 MIL/uL (ref 4.22–5.81)
WBC: 20.1 10*3/uL — ABNORMAL HIGH (ref 4.0–10.5)

## 2011-09-23 LAB — BASIC METABOLIC PANEL
CO2: 33 mEq/L — ABNORMAL HIGH (ref 19–32)
Calcium: 9.3 mg/dL (ref 8.4–10.5)
Chloride: 95 mEq/L — ABNORMAL LOW (ref 96–112)
Sodium: 133 mEq/L — ABNORMAL LOW (ref 135–145)

## 2011-09-23 MED ORDER — ALUM & MAG HYDROXIDE-SIMETH 200-200-20 MG/5ML PO SUSP
30.0000 mL | ORAL | Status: DC | PRN
  Administered 2011-09-23 – 2011-09-29 (×2): 30 mL via ORAL
  Filled 2011-09-23 (×2): qty 30

## 2011-09-23 NOTE — Progress Notes (Signed)
Name: Joseph [name] MRN: [id number] DOB: 09/29/1930    LOS: 4  PCCM PROGRESS NOTE  History of Present Illness: 77 yo ex smoker with COPD recently treated for URI with Levaquin and brought to ED on 12/16 with respiratory distress and hypoxemia.  Lines / Drains: None  Cultures: 12/16  BC>>>ntd  Antibiotics: 12/16  Levaquin>>>12/17 12/16  Ceftriaxone>>> 12/16  Azithromycin>>>  Tests / Events: 12/17  Reports significant dyspnea>>>back on BiPAP 12/18  Somewhat anxious, otherwise reports breathing being better 12/19  Reports feeling better.  RN concerned about significant element of anxiety. 12/19  PFT>>>FEV1/FVC=36, FEV1=0.7L (23%Pred) 12/20  No acute events overnight.  Slept better.  Less dyspnea this AM.  Vital Signs: Temp:  [97.2 F (36.2 C)-98 F (36.7 C)] 97.5 F (36.4 C) (12/20 0800) Pulse Rate:  [94-120] 100  (12/20 0800) Resp:  [15-19] 17  (12/20 0800) BP: (110-139)/(58-89) 118/62 mmHg (12/20 0800) SpO2:  [92 %-97 %] 96 % (12/20 0821) Weight:  [60.782 kg (134 lb)] 134 lb (60.782 kg) (12/20 0600) I/O last 3 completed shifts: In: 2010 [P.O.:360; I.V.:1350; IV Piggyback:300] Out: 3400 [Urine:3400]  Physical Examination: General:  Comfortable, no distress Neuro:  Nonfocal HEENT:  PERRL, NCAT Neck:  No JVD   Cardiovascular:  RRR, no murmurs Lungs:  CTAB, very diminished Abdomen:  Soft, nontender, bowel sounds present Musculoskeletal:  No pedal edema Skin:  No rash  Ventilator settings:    Labs and Imaging:  Reviewed.  Please refer to the Assessment and Plan section for relevant results.  Assessment and Plan:  Significant anxiety which appears to interfere with normal breathing pattern -->continue Xanax PRN -->Ativan 0.5 mg PO bid  Acute exacerbation of COPD, PFT>>>severe COPD (FEV1=0.7L (23%Pred) -->Prednisone 40 daily -->bronchodilators  Suspected Pneumonia (CAP), failed Levaquin as outpatient.   CXR today >>>increased RLL density.  PCT < 0.10.  Strep U Ag>>>neg.  Steroids may also account for elevated WBC.  Lab 09/23/11 0444 09/22/11 0634 09/21/11 0405 09/20/11 0415 09/19/11 1244  WBC 20.1* 21.8* 19.5* 13.2* 16.7*   -->Ceftriaxone -->Azithromucin -->may convert to Avelox upon d/c  Acute respiratory failure, resolved   Lab 09/20/11 0418 09/19/11 1342  PHART 7.369 7.375  PCO2ART 47.6* 44.8  PO2ART 66.6* 108.0*  HCO3 26.8* 26.2*  TCO2 28.2 28  O2SAT 91.9 98.0   -->monitor  HTN/CAD/Dyslipidemia  -->ASA,  Zetia, Lisinopril  Diabetes mellitus, exacerbated by systemic steroids, suboptimal control  Lab 09/23/11 0414 09/22/11 2339 09/22/11 2025 09/22/11 1623 09/22/11 1206  GLUCAP 157* 106* 270* 255* 284*   -->Metformin  -->SSI -->Lantus 10 units qHS  Best practices / Disposition -->floor status under PCCM -->full code -->Lovenox for DVT Px -->GI Px is not indicated -->Diet -->PT/OT/care management consults -->family updated at bedside, discussed the severity of underline disease and prognosis.  Family will further discuss code status.  Orlean Bradford, M.D. Pulmonary and Critical Care Medicine [facility] Cell: [phone] Pager: [phone]  09/23/2011, 12:05 PM

## 2011-09-24 LAB — GLUCOSE, CAPILLARY
Glucose-Capillary: 74 mg/dL (ref 70–99)
Glucose-Capillary: 75 mg/dL (ref 70–99)

## 2011-09-24 MED ORDER — BOOST / RESOURCE BREEZE PO LIQD
1.0000 | Freq: Two times a day (BID) | ORAL | Status: DC
  Administered 2011-09-24 – 2011-09-28 (×7): 1 via ORAL

## 2011-09-24 MED ORDER — ENSURE PUDDING PO PUDG
1.0000 | Freq: Two times a day (BID) | ORAL | Status: DC
  Administered 2011-09-27 – 2011-09-28 (×3): 1 via ORAL

## 2011-09-24 NOTE — Progress Notes (Signed)
Physical Therapy Evaluation Patient Details Name: Joseph [name] MRN: [id number] DOB: Dec 26, 1929 Today's Date: 09/24/2011  Problem List:  Patient Active Problem List  Diagnoses  . COPD (chronic obstructive pulmonary disease)  . Acute-on-chronic respiratory failure  . CAD (coronary artery disease), native coronary artery  . Peripheral vascular disease    Past Medical History:  Past Medical History  Diagnosis Date  . Diabetes mellitus   . Hyperlipidemia   . Myocardial infarction   . COPD (chronic obstructive pulmonary disease)   . Emphysema of lung   . Abdominal aneurysm without mention of rupture   . Thyroid disease     Hyperthyroidism, Goiter   Past Surgical History:  Past Surgical History  Procedure Date  . Hernia repair     LEFT INGUINAL  . Abdominal aorta stent     ENDOSTENT REPAIR 10/15/2010  . Prostate surgery   . Abdominal aortic aneurysm repair 05/26/11    PEVAR    PT Assessment/Plan/Recommendation Clinical Impression Statement: Pt limited by cardiopulmonary status (HR up to 134 bpm with SLOW gait) and will benefit from further mobility and balance assessment.  Pt was providing care for his wife with dementia PTA and will have assist from his children over the holidays.   PT Problem List: Decreased activity tolerance;Decreased balance;Decreased mobility;Cardiopulmonary status limiting activity;Decreased knowledge of use of DME Barriers to Discharge: Decreased caregiver support Barriers to Discharge Comments: family available during holidays.Marland KitchenMarland Kitchen? beyond that PT Therapy Diagnosis : Difficulty walking PT Plan PT Frequency: Min 3X/week PT Treatment/Interventions: DME instruction;Gait training;Stair training;Functional mobility training;Therapeutic activities;Balance training;Patient/family education PT Recommendation Follow Up Recommendations: Home health PT (safety eval, especially since cares for his wife) Equipment Recommended: Other (comment) (TBA) PT Goals    Acute Rehab PT Goals PT Goal Formulation: With patient Time For Goal Achievement: 7 days Pt will Roll Supine to Left Side: Independently PT Goal: Rolling Supine to Left Side - Progress: Not met Pt will go Supine/Side to Sit: with supervision;with HOB 0 degrees PT Goal: Supine/Side to Sit - Progress: Not met Pt will go Sit to Supine/Side: with supervision;with HOB 0 degrees PT Goal: Sit to Supine/Side - Progress: Not met Pt will go Sit to Stand: with supervision PT Goal: Sit to Stand - Progress: Not met Pt will go Stand to Sit: with supervision PT Goal: Stand to Sit - Progress: Not met Pt will Ambulate: 16 - 50 feet;with supervision;with least restrictive assistive device PT Goal: Ambulate - Progress: Not met Pt will Go Up / Down Stairs: 1-2 stairs;with min assist PT Goal: Up/Down Stairs - Progress: Not met Pt will Perform Home Exercise Program: with supervision, verbal cues required/provided (for strengthening and balance) PT Goal: Perform Home Exercise Program - Progress: Not met  PT Evaluation Precautions/Restrictions  Precautions Precautions: Other (comment);Fall Precaution Comments: Desaturates with activity Prior Functioning  Home Living Lives With: Spouse Receives Help From: Family;Other (Comment) (son (bipolar) to stay with them on d/c) Type of Home: House Home Layout: One level Home Access: Stairs to enter Entrance Stairs-Rails: None Entrance Stairs-Number of Steps: 2 Bathroom Shower/Tub: Forensic scientist: Handicapped height Bathroom Accessibility: Yes How Accessible: Accessible via wheelchair;Accessible via walker Home Adaptive Equipment: Straight cane;Walker - rolling;Shower chair with back;Other (comment) Additional Comments: sink beside toilet Prior Function Level of Independence: Independent with basic ADLs;Independent with homemaking with ambulation;Independent with gait Driving: Yes Vocation: Retired Comments: pt takes care of wife  due to dementia--bathing, transfers,  Cognition Cognition Arousal/Alertness: Awake/alert Overall Cognitive Status: Appears within functional limits for  tasks assessed Sensation/Coordination Sensation Light Touch: Appears Intact Coordination Gross Motor Movements are Fluid and Coordinated: Yes Fine Motor Movements are Fluid and Coordinated: Yes Extremity Assessment RUE Assessment RUE Assessment: Within Functional Limits LUE Assessment LUE Assessment: Within Functional Limits RLE Assessment RLE Assessment: Within Functional Limits LLE Assessment LLE Assessment: Within Functional Limits Mobility (including Balance) Bed Mobility Rolling Left: 4: Min assist;With rail Left Sidelying to Sit: 4: Min assist;HOB flat;With rails Supine to Sit: Other (comment) Transfers Sit to Stand: 4: Min assist Stand to Sit: 4: Min assist Ambulation/Gait Ambulation/Gait: Yes Ambulation/Gait Assistance: 4: Min assist Ambulation/Gait Assistance Details (indicate cue type and reason): VERY slow velocity due to cardiopulmonary status; HR incr 110 to 134; dyspnea 3/4; SaO2 92-95% on 3.5 L [place] O2 Ambulation Distance (Feet): 12 Feet Assistive device: 1 person hand held assist Gait Pattern: Step-through pattern;Decreased step length - right;Decreased step length - left Stairs: No  Posture/Postural Control Posture/Postural Control: No significant limitations Balance Balance Assessed: Yes Static Sitting Balance Static Sitting - Balance Support: Feet supported;No upper extremity supported Static Sitting - Level of Assistance: 7: Independent Static Sitting - Comment/# of Minutes: 3 minutes while decr dyspnea Static Standing Balance Static Standing - Balance Support: No upper extremity supported Static Standing - Level of Assistance: 5: Stand by assistance Static Standing - Comment/# of Minutes: for safety Exercise    End of Session PT - End of Session Equipment Utilized During Treatment: Gait  belt Activity Tolerance: Treatment limited secondary to medical complications (Comment) (cardiopulmonary status (see gait)) Patient left: in chair;with call bell in reach Nurse Communication: Mobility status for ambulation General Behavior During Session: [facility] for tasks performed Cognition: [facility] for tasks performed  Asim Gersten 09/24/2011, 2:00 PM Pager [phone]

## 2011-09-24 NOTE — Progress Notes (Signed)
Name: Joseph [name] MRN: [id number] DOB: 1930/04/05    LOS: 5  PCCM PROGRESS NOTE  History of Present Illness: 77 yo ex smoker with COPD recently treated for URI with Levaquin and brought to ED on 12/16 with respiratory distress and hypoxemia.  Lines / Drains: None  Cultures: 12/16  BC>>>ntd  Antibiotics: 12/16  Levaquin>>>12/17 12/16  Ceftriaxone>>> 12/16  Azithromycin>>>  Tests / Events: 12/17  Reports significant dyspnea>>>back on BiPAP 12/18  Somewhat anxious, otherwise reports breathing being better 12/19  Reports feeling better.  RN concerned about significant element of anxiety. 12/19  PFT>>>FEV1/FVC=36, FEV1=0.7L (23%Pred)  12/21  No acute events overnight.  Slept better.  Less dyspnea this AM.  Vital Signs: Temp:  [97.2 F (36.2 C)-97.9 F (36.6 C)] 97.8 F (36.6 C) (12/21 0641) Pulse Rate:  [103-120] 103  (12/21 0641) Resp:  [9-22] 18  (12/21 0641) BP: (101-128)/(59-82) 107/60 mmHg (12/21 0641) SpO2:  [92 %-100 %] 92 % (12/21 0756) Weight:  [59.7 kg (131 lb 9.8 oz)] 131 lb 9.8 oz (59.7 kg) (12/21 0650) I/O last 3 completed shifts: In: 820 [P.O.:120; I.V.:700] Out: 4000 [Urine:4000]  Physical Examination: General:  Comfortable, no distress Neuro:  Nonfocal HEENT:  PERRL, NCAT Neck:  No JVD   Cardiovascular:  RRR, no murmurs Lungs:  CTAB, very diminished bl, no rhonchi Abdomen:  Soft, nontender, bowel sounds present Musculoskeletal:  No pedal edema Skin:  No rash     Labs and Imaging:  Reviewed.  Please refer to the Assessment and Plan section for relevant results. pCXR 12/20 - improved aeration RLL Assessment and Plan:  Significant anxiety which appears to interfere with normal breathing pattern -->continue Xanax PRN -->Ativan 0.5 mg PO bid  Acute exacerbation of COPD, PFT>>>severe COPD (FEV1=0.7L (23%Pred) -->Prednisone 40 daily- taper over 2 weeks -->bronchodilators  Suspected Pneumonia (CAP), failed Levaquin as outpatient.   CXR today  >>>increased RLL density.  PCT < 0.10. Strep U Ag>>>neg.  Steroids may also account for elevated WBC.  Lab 09/23/11 0444 09/22/11 0634 09/21/11 0405 09/20/11 0415 09/19/11 1244  WBC 20.1* 21.8* 19.5* 13.2* 16.7*   -->Ceftriaxone -->Azithromucin -->may convert to Avelox in 24h  Acute respiratory failure, resolved   Lab 09/20/11 0418 09/19/11 1342  PHART 7.369 7.375  PCO2ART 47.6* 44.8  PO2ART 66.6* 108.0*  HCO3 26.8* 26.2*  TCO2 28.2 28  O2SAT 91.9 98.0   -->monitor, evaluate for home O2 on dc  HTN/CAD/Dyslipidemia  -->ASA,  Zetia, Lisinopril  Diabetes mellitus, exacerbated by systemic steroids, suboptimal control  Lab 09/24/11 0754 09/24/11 0428 09/24/11 0007 09/23/11 2033 09/23/11 1736  GLUCAP 78 74 75 191* 241*   -->Metformin  -->SSI -->Lantus 10 units qHS  Best practices / Disposition -->floor status - Dr Kevan Ny informed -->full code -->Lovenox for DVT Px -->GI Px is not indicated -->Diet -->PT/OT/care management consults -->family updated at bedside, 12/20discussed the severity of underline disease and prognosis.  Family will further discuss code status.   Joseph [name] [phone] 09/24/2011, 10:57 AM

## 2011-09-24 NOTE — Progress Notes (Signed)
Occupational Therapy Evaluation Patient Details Name: Joseph [name] MRN: [id number] DOB: 1930-09-14 Today's Date: 09/24/2011  Problem List:  Patient Active Problem List  Diagnoses  . COPD (chronic obstructive pulmonary disease)  . Acute-on-chronic respiratory failure  . CAD (coronary artery disease), native coronary artery  . Peripheral vascular disease    Past Medical History:  Past Medical History  Diagnosis Date  . Diabetes mellitus   . Hyperlipidemia   . Myocardial infarction   . COPD (chronic obstructive pulmonary disease)   . Emphysema of lung   . Abdominal aneurysm without mention of rupture   . Thyroid disease     Hyperthyroidism, Goiter   Past Surgical History:  Past Surgical History  Procedure Date  . Hernia repair     LEFT INGUINAL  . Abdominal aorta stent     ENDOSTENT REPAIR 10/15/2010  . Prostate surgery   . Abdominal aortic aneurysm repair 05/26/11    PEVAR    OT Assessment/Plan/Recommendation OT Assessment Clinical Impression Statement: Patient will benefit from skilled OT in the acute setting to maximize independence with ADL and ADL mobility to facilitate d/c to either next venue of care or home. OT Recommendation/Assessment: Patient will need skilled OT in the acute care venue OT Problem List: Decreased strength;Decreased activity tolerance;Decreased knowledge of use of DME or AE;Decreased knowledge of precautions OT Therapy Diagnosis : Generalized weakness OT Plan OT Frequency: Min 2X/week OT Treatment/Interventions: Self-care/ADL training;Therapeutic exercise;DME and/or AE instruction;Patient/family education;Therapeutic activities OT Recommendation Follow Up Recommendations: Home health OT vs ST-SNF pending patient progress Equipment Recommended: None recommended by OT Individuals Consulted Consulted and Agree with Results and Recommendations: Patient OT Goals Acute Rehab OT Goals OT Goal Formulation: With patient Time For Goal  Achievement: 7 days ADL Goals Pt Will Perform Grooming: Independently;Standing at sink ADL Goal: Grooming - Progress: Progressing toward goals Pt Will Perform Upper Body Bathing: with modified independence;Sitting in shower ADL Goal: Upper Body Bathing - Progress: Progressing toward goals Pt Will Perform Lower Body Bathing: with modified independence;Sit to stand in shower ADL Goal: Lower Body Bathing - Progress: Progressing toward goals Pt Will Perform Upper Body Dressing: Independently;Sitting, bed ADL Goal: Upper Body Dressing - Progress: Progressing toward goals Pt Will Perform Lower Body Dressing: Independently;Sit to stand from bed ADL Goal: Lower Body Dressing - Progress: Progressing toward goals Pt Will Transfer to Toilet: with modified independence;Ambulation;Regular height toilet ADL Goal: Toilet Transfer - Progress: Progressing toward goals Pt Will Perform Toileting - Clothing Manipulation: Independently;Standing ADL Goal: Toileting - Clothing Manipulation - Progress: Progressing toward goals Pt Will Perform Toileting - Hygiene: Independently;Sit to stand from 3-in-1/toilet;with modified independence ADL Goal: Toileting - Hygiene - Progress: Progressing toward goals Pt Will Perform Tub/Shower Transfer: with modified independence;Ambulation;Shower seat without back ADL Goal: Web designer - Progress: Progressing toward goals  OT Evaluation Precautions/Restrictions  Precautions Precautions: Other (comment);Fall Precaution Comments: Desaturates with activity Restrictions Weight Bearing Restrictions: No Prior Functioning Home Living Lives With: Spouse Receives Help From: Family;Other (Comment) (son (bipolar) to stay with them on d/c) Type of Home: House Home Layout: One level Home Access: Stairs to enter Entrance Stairs-Rails: None Entrance Stairs-Number of Steps: 2 Bathroom Shower/Tub: Forensic scientist: Handicapped height Bathroom  Accessibility: Yes How Accessible: Accessible via wheelchair;Accessible via walker Home Adaptive Equipment: Straight cane;Walker - rolling;Shower chair with back;Other (comment) Additional Comments: sink beside toilet Prior Function Level of Independence: Independent with basic ADLs;Independent with homemaking with ambulation;Independent with gait Driving: Yes Vocation: Retired Comments: pt takes care of wife  due to dementia--bathing, transfers,  ADL ADL Grooming: Simulated Grooming Details (indicate cue type and reason): Min guard assist secondary to quick to fatigue and desaturate.  Where Assessed - Grooming: Standing at sink Lower Body Dressing: Simulated;Maximal assistance Lower Body Dressing Details (indicate cue type and reason): Patient declining to bend down to don socks secondary to did not want to feel "woozy headed" Unable to place ankles across knees.  Where Assessed - Lower Body Dressing: Sitting, bed Toilet Transfer: Simulated;Minimal assistance Toilet Transfer Details (indicate cue type and reason): Simulated EOB, around bed, to chair. Min hand held assist- slow but steady gait Toilet Transfer Method: Ambulating Toileting - Clothing Manipulation: Simulated (Min guard assist) Where Assessed - Toileting Clothing Manipulation: Standing Toileting - Hygiene: Simulated (Min guard assist) Where Assessed - Toileting Hygiene: Standing Tub/Shower Transfer: Not assessed ADL Comments: Limited eval secondary to patient desaturates with activity and HR increases. Patient has been limited to bed for past few days. Vision/Perception  Vision - History Baseline Vision: Wears glasses only for reading Patient Visual Report: No change from baseline Cognition Cognition Arousal/Alertness: Awake/alert Overall Cognitive Status: Appears within functional limits for tasks assessed Sensation/Coordination Sensation Light Touch: Appears Intact Coordination Gross Motor Movements are Fluid and  Coordinated: Yes Fine Motor Movements are Fluid and Coordinated: Yes Extremity Assessment RUE Assessment RUE Assessment: Within Functional Limits LUE Assessment LUE Assessment: Within Functional Limits Mobility  Bed Mobility Rolling Left: 4: Min assist;With rail Left Sidelying to Sit: 4: Min assist;HOB flat;With rails Supine to Sit: Other (comment) Transfers Sit to Stand: 4: Min assist Stand to Sit: 4: Min assist End of Session OT - End of Session Equipment Utilized During Treatment: Gait belt Activity Tolerance:  (Patient limited by oxygen desaturation and fatigue) Patient left: in chair;with call bell in reach Nurse Communication: Mobility status for transfers;Mobility status for ambulation General Behavior During Session: [facility] for tasks performed Cognition: [facility] for tasks performed   Goerge Mohr 09/24/2011, 1:52 PM

## 2011-09-24 NOTE — Progress Notes (Signed)
Nutrition Follow-up  Intake is 100% of most meals. Pt only ate 25% of lunch, reports he was too tired to eat it. Not drinking Ensure, states that it upsets his stomach. Agreeable to try other supplements.  Diet Order:  CHO modified medium Supplements: Ensure Clinical Strength PO TID  Meds: Scheduled Meds:   . albuterol  2.5 mg Nebulization Q4H  . aspirin EC  81 mg Oral Daily  . azithromycin  500 mg Intravenous Daily  . cefTRIAXone (ROCEPHIN)  IV  1 g Intravenous Daily  . dorzolamide  1 drop Left Eye QAC breakfast  . enoxaparin  40 mg Subcutaneous Daily  . ezetimibe  10 mg Oral Daily  . feeding supplement  237 mL Oral TID BM  . insulin aspart  0-15 Units Subcutaneous Q4H  . insulin glargine  10 Units Subcutaneous QHS  . ipratropium  0.5 mg Nebulization Q4H  . lisinopril  20 mg Oral Daily  . LORazepam  0.5 mg Oral BID  . metFORMIN  500 mg Oral BID WC  . predniSONE  40 mg Oral Q breakfast  . senna-docusate  2 tablet Oral BID  . Tamsulosin HCl  0.4 mg Oral Daily  . Travoprost (BAK Free)  1 drop Both Eyes QHS   Continuous Infusions:   . DISCONTD: sodium chloride 50 mL/hr at 09/22/11 1900   PRN Meds:.sodium chloride, acetaminophen, acetaminophen, ALPRAZolam, alum & mag hydroxide-simeth, morphine injection  Labs:  CMP     Component Value Date/Time   NA 133* 09/23/2011 0444   K 5.1 09/23/2011 0444   CL 95* 09/23/2011 0444   CO2 33* 09/23/2011 0444   GLUCOSE 145* 09/23/2011 0444   BUN 22 09/23/2011 0444   CREATININE 0.63 09/23/2011 0444   CALCIUM 9.3 09/23/2011 0444   PROT 6.2 09/20/2011 0415   ALBUMIN 3.3* 09/20/2011 0415   AST 12 09/20/2011 0415   ALT 14 09/20/2011 0415   ALKPHOS 71 09/20/2011 0415   BILITOT 0.3 09/20/2011 0415   GFRNONAA >90 09/23/2011 0444   GFRAA >90 09/23/2011 0444   CBG (last 3)   Basename 09/24/11 1138 09/24/11 0754 09/24/11 0428  GLUCAP 146* 78 74      Intake/Output Summary (Last 24 hours) at 09/24/11 1156 Last data filed at 09/24/11  1000  Gross per 24 hour  Intake    720 ml  Output   2125 ml  Net  -1405 ml   Weight Status:  59.7 kg, wt stable  Nutrition Dx:  Malnutrition - ongoing.  Goal:  Oral intake to meet nutrition needs - met.  Intervention:   1. D/c Ensure Clinical Strength TID 2. Resource Breeze PO BID 3. Ensure Pudding PO BID 4. RD to follow nutrition care plan.  Monitor:  Weights, labs, PO intake, I/O's  Adair Laundry Pager #:  [phone]

## 2011-09-25 LAB — GLUCOSE, CAPILLARY
Glucose-Capillary: 106 mg/dL — ABNORMAL HIGH (ref 70–99)
Glucose-Capillary: 219 mg/dL — ABNORMAL HIGH (ref 70–99)
Glucose-Capillary: 236 mg/dL — ABNORMAL HIGH (ref 70–99)
Glucose-Capillary: 252 mg/dL — ABNORMAL HIGH (ref 70–99)
Glucose-Capillary: 85 mg/dL (ref 70–99)

## 2011-09-25 LAB — CULTURE, BLOOD (ROUTINE X 2)
Culture  Setup Time: 201212161919
Culture: NO GROWTH
Culture: NO GROWTH

## 2011-09-25 MED ORDER — CLOTRIMAZOLE 10 MG MT TROC
10.0000 mg | Freq: Three times a day (TID) | OROMUCOSAL | Status: DC
  Administered 2011-09-25 – 2011-10-01 (×17): 10 mg via ORAL
  Filled 2011-09-25 (×22): qty 1

## 2011-09-25 NOTE — Progress Notes (Signed)
Name: Joseph [name] MRN: [id number] DOB: [birth year]    LOS: 6  PCCM PROGRESS NOTE  History of Present Illness: 77 yo ex smoker with COPD recently treated for URI with Levaquin and brought to ED on 12/16 with respiratory distress and hypoxemia.  Lines / Drains: None  Cultures: 12/16  BC x 2 >>>neg 12/17 BC x 2 >>>>  Antibiotics: 12/16  Levaquin>>>12/17 12/16  Ceftriaxone>>> 12/16  Azithromycin>>>  Tests / Events: 12/17  Reports significant dyspnea>>>back on BiPAP 12/18  Somewhat anxious, otherwise reports breathing being better 12/19  Reports feeling better.  RN concerned about significant element of anxiety. 12/19  PFT>>>FEV1/FVC=36, FEV1=0.7L (23%Pred)  Overnight Doing better, only c/o sore mouth and throat  Vital Signs: Temp:  [97.3 F (36.3 C)-97.9 F (36.6 C)] 97.9 F (36.6 C) (12/22 1400) Pulse Rate:  [94-108] 108  (12/22 1400) Resp:  [20-22] 20  (12/22 1400) BP: (84-102)/(57-63) 90/60 mmHg (12/22 1400) SpO2:  [90 %-95 %] 94 % (12/22 1400) Weight:  [133 lb 2.5 oz (60.4 kg)] 133 lb 2.5 oz (60.4 kg) (12/22 0628) I/O last 3 completed shifts: In: 1860 [P.O.:1560; IV Piggyback:300] Out: 2800 [Urine:2800]  Physical Examination: General:  Comfortable, no distress Neuro:  Nonfocal HEENT:  PERRL, mild thrush Neck:  No JVD   Cardiovascular:  RRR, no murmurs Lungs:  CTAB, very diminished bl, no rhonchi Abdomen:  Soft, nontender, bowel sounds present Musculoskeletal:  No pedal edema Skin:  No rash     Labs and Imaging:  Reviewed.  Please refer to the Assessment and Plan section for relevant results. pCXR 12/20 - improved aeration RLL Assessment and Plan:  Significant anxiety which appears to interfere with normal breathing pattern -->continue Xanax PRN -->Ativan 0.5 mg PO bid  Acute exacerbation of COPD, PFT>>>severe COPD (FEV1=0.7L (23%Pred) -->Prednisone 40 daily- taper over 2 weeks -->bronchodilators  Suspected Pneumonia (CAP), failed Levaquin as  outpatient.      -->see dashboard   Acute respiratory failure, resolved   Lab 09/20/11 0418 09/19/11 1342  PHART 7.369 7.375  PCO2ART 47.6* 44.8  PO2ART 66.6* 108.0*  HCO3 26.8* 26.2*  TCO2 28.2 28  O2SAT 91.9 98.0   -->monitor, evaluate for home O2 on dc  HTN/CAD/Dyslipidemia  -->ASA,  Zetia, Lisinopril  Diabetes mellitus, exacerbated by systemic steroids, suboptimal control  Lab 09/25/11 1138 09/25/11 0752 09/25/11 0412 09/24/11 2359 09/24/11 2042  GLUCAP 159* 96 85 236* 219*   -->Metformin  -->SSI -->Lantus 10 units qHS  Best practices / Disposition -->floor status - Dr Kevan Ny informed -->full code -->Lovenox for DVT Px -->GI Px is not indicated -->Diet -->PT/OT/care management consults -->family updated at bedside, 12/20discussed the severity of underline disease and prognosis.  Family will further discuss code status.   Sore mouth/ throat ? All thrush ? ACEI related rx 12/22 start clotrimazole, stop ACEI    Sandrea Hughs, MD Pulmonary and Critical Care Medicine [facility] Cell [phone]

## 2011-09-26 DIAGNOSIS — B370 Candidal stomatitis: Secondary | ICD-10-CM

## 2011-09-26 DIAGNOSIS — E119 Type 2 diabetes mellitus without complications: Secondary | ICD-10-CM

## 2011-09-26 LAB — CULTURE, BLOOD (ROUTINE X 2)
Culture  Setup Time: 201212171040
Culture: NO GROWTH

## 2011-09-26 LAB — GLUCOSE, CAPILLARY
Glucose-Capillary: 202 mg/dL — ABNORMAL HIGH (ref 70–99)
Glucose-Capillary: 118 mg/dL — ABNORMAL HIGH (ref 70–99)
Glucose-Capillary: 149 mg/dL — ABNORMAL HIGH (ref 70–99)
Glucose-Capillary: 236 mg/dL — ABNORMAL HIGH (ref 70–99)

## 2011-09-26 LAB — LEGIONELLA ANTIGEN, URINE

## 2011-09-26 MED ORDER — IPRATROPIUM BROMIDE 0.02 % IN SOLN
0.5000 mg | Freq: Four times a day (QID) | RESPIRATORY_TRACT | Status: DC
  Administered 2011-09-26: 0.5 mg via RESPIRATORY_TRACT
  Filled 2011-09-26: qty 2.5

## 2011-09-26 MED ORDER — INSULIN ASPART 100 UNIT/ML ~~LOC~~ SOLN
0.0000 [IU] | Freq: Three times a day (TID) | SUBCUTANEOUS | Status: DC
  Administered 2011-09-27: 5 [IU] via SUBCUTANEOUS
  Administered 2011-09-28: 11 [IU] via SUBCUTANEOUS
  Administered 2011-09-28: 5 [IU] via SUBCUTANEOUS
  Administered 2011-09-29: 3 [IU] via SUBCUTANEOUS
  Administered 2011-09-29: 5 [IU] via SUBCUTANEOUS
  Administered 2011-09-30 (×2): 3 [IU] via SUBCUTANEOUS
  Filled 2011-09-26: qty 3

## 2011-09-26 MED ORDER — PREDNISONE 20 MG PO TABS
30.0000 mg | ORAL_TABLET | Freq: Every day | ORAL | Status: DC
  Filled 2011-09-26 (×2): qty 1

## 2011-09-26 MED ORDER — ALBUTEROL SULFATE (5 MG/ML) 0.5% IN NEBU
2.5000 mg | INHALATION_SOLUTION | Freq: Four times a day (QID) | RESPIRATORY_TRACT | Status: DC
  Administered 2011-09-26: 2.5 mg via RESPIRATORY_TRACT
  Filled 2011-09-26: qty 0.5

## 2011-09-26 NOTE — Progress Notes (Signed)
Subjective: Still weak, breathing much better.  Mouth sore, having a hard time chewing  Objective: Vital signs in last 24 hours: Temp:  [97.8 F (36.6 C)-97.9 F (36.6 C)] 97.8 F (36.6 C) (12/23 0700) Pulse Rate:  [104-110] 105  (12/23 0700) Resp:  [18-20] 18  (12/23 0700) BP: (90-101)/(60-65) 100/64 mmHg (12/23 0700) SpO2:  [91 %-96 %] 95 % (12/23 0830) Weight change:  Last BM Date: 09/23/11  Intake/Output from previous day: 12/22 0701 - 12/23 0700 In: 1440 [P.O.:1440] Out: 1525 [Urine:1525] Intake/Output this shift: Total I/O In: 300 [P.O.:300] Out: -   General appearance: alert and cooperative Resp: decreased breath sounds, clear Cardio: regular rate and rhythm, S1, S2 normal, no murmur, click, rub or gallop GI: soft, non-tender; bowel sounds normal; no masses,  no organomegaly Extremities: extremities normal, atraumatic, no cyanosis or edema  Lab Results: No results found for this basename: WBC:2,HGB:2,HCT:2,PLT:2 in the last 72 hours BMET No results found for this basename: NA:2,K:2,CL:2,CO2:2,GLUCOSE:2,BUN:2,CREATININE:2,CALCIUM:2 in the last 72 hours  Studies/Results: No results found.  Medications: I have reviewed the patient's current medications.  Assessment/Plan: Active Problems:  COPD (chronic obstructive pulmonary disease)  Exacerbation improved.  Taper prednisone. Taper down O2, continue albuterol.  D/C antiobiotic tomorrow after 7 days  Acute-on-chronic respiratory failure resolved  CAD (coronary artery disease), native coronary artery stable  Diabetes mellitus  Control fair continue lantus, metformin and SSI to q6  Thrush clotrimazole.  Change diet to soft until mouth soreness improves  Disposition  Per case manager note may be going [facility] [place]tr in [place]   LOS: 7 days   [employer] 09/26/2011, 1:55 PM

## 2011-09-26 NOTE — Progress Notes (Signed)
Name: Joseph [name] MRN: [id number] DOB: June 18, 1930    LOS: 7  PCCM PROGRESS NOTE  History of Present Illness: 82 yowm ex smoker with COPD recently treated for URI with Levaquin and brought to ED on 12/16 with respiratory distress and hypoxemia.  Lines / Drains: None  Cultures: 12/16  BC x 2 > neg 12/17 BC x 2 >  neg  Antibiotics: 12/16  Levaquin>>>12/17 12/16  Ceftriaxone>>> 12/16  Azithromycin>>>  Tests / Events: 12/17  Reports significant dyspnea>>>back on BiPAP 12/18  Somewhat anxious, otherwise reports breathing being better 12/19  Reports feeling better.  RN concerned about significant element of anxiety. 12/19  PFT>>>FEV1/FVC=36, FEV1=0.7L (23%Pred)  Overnight Doing better, no increased wob or desat on  3lpm NP, mod congested cough  Vital Signs: Temp:  [97.8 F (36.6 C)-97.9 F (36.6 C)] 97.8 F (36.6 C) (12/23 0700) Pulse Rate:  [104-110] 105  (12/23 0700) Resp:  [18-20] 18  (12/23 0700) BP: (90-101)/(60-65) 100/64 mmHg (12/23 0700) SpO2:  [91 %-96 %] 95 % (12/23 0830) I/O last 3 completed shifts: In: 1440 [P.O.:1440] Out: 2125 [Urine:2125]  Physical Examination: General:  Comfortable, no distress Neuro:  Nonfocal HEENT:  PERRL, mild thrush Neck:  No JVD   Cardiovascular:  RRR, no murmurs Lungs:  CTAB, very diminished bl, no rhonchi Abdomen:  Soft, nontender, bowel sounds present Musculoskeletal:  No pedal edema Skin:  No rash     Labs and Imaging:  Lab 09/23/11 0444 09/22/11 0634 09/21/11 0405  NA 133* 134* 131*  K 5.1 4.9 4.7  CL 95* 95* 94*  CO2 33* 33* 30  BUN 22 23 22   CREATININE 0.63 0.74 0.69  GLUCOSE 145* 183* 187*    Lab 09/23/11 0444 09/22/11 0634 09/21/11 0405  HGB 15.4 15.4 14.6  HCT 45.9 46.0 42.6  WBC 20.1* 21.8* 19.5*  PLT 301 291 261    . pCXR 12/20 - improved aeration RLL   Assessment and Plan:  Significant anxiety which appears to interfere with normal breathing pattern -->continue Xanax PRN -->Ativan 0.5  mg PO bid  Acute exacerbation of COPD, PFT>>>severe COPD (FEV1=0.7L (23%Pred) -->Prednisone 40 daily- taper over 2 weeks -->bronchodilators  Suspected Pneumonia (CAP), failed Levaquin as outpatient.      -->see dashboard   Acute respiratory failure, improved Still 02 dep @ 3lpm np   -->monitor, evaluate for home O2 on dc  HTN/CAD/Dyslipidemia  -->ASA,  Zetia D/c ACE inhibitors 12/22 due to concerns re muddying the waters when interpreting non-specific chronic resp complaints like "congestion"  Diabetes mellitus, exacerbated by systemic steroids, suboptimal control  Lab 09/26/11 1126 09/26/11 0735 09/26/11 0459 09/26/11 0022 09/25/11 1953  GLUCAP 149* 118* 202* 296* 106*   -->Metformin  -->SSI -->Lantus 10 units qHS  Best practices / Disposition -->floor status  -->full code -->Lovenox for DVT Px -->GI Px is not indicated -->Diet -->PT/OT/care management consults -- Add flutter 12/23 - Transfer to Dr Kevan Ny service, pulmonary f/u prn    Sandrea Hughs, MD Pulmonary and Critical Care Medicine [facility] Cell [phone]

## 2011-09-26 NOTE — Progress Notes (Signed)
   CARE MANAGEMENT NOTE 09/26/2011  Patient:  ORANGE, HILLIGOSS   Account Number:  [id number]  Date Initiated:  09/24/2011  Documentation initiated by:  [facility]  Subjective/Objective Assessment:   SOB-     Action/Plan:   discharge planning   Anticipated DC Date:  09/24/2011   Anticipated DC Plan:  HOME W HOME HEALTH SERVICES      DC Planning Services  CM consult      Choice offered to / List presented to:             Status of service:  In process, will continue to follow Medicare Important Message given?   (If response is "NO", the following Medicare IM given date fields will be blank) Date Medicare IM given:   Date Additional Medicare IM given:    Discharge Disposition:    Per UR Regulation:  Reviewed for med. necessity/level of care/duration of stay  Comments:  Contact:  Fransisca Connors (daughter)  Home:  [phone]  Cell:  [phone]   09/26/11 1315--This NCM spoke at length to Hilda Lias, daughter, about pt. discharge plans.  Daughter states that pt.'s spouse, Dondra Spry, is not able to care for him at home.  She is interested in placing pt.in assisted living in [facility], so that pt. would be closer to her.  Contacted Josi, CSW to make referral for new SNF.  NCM will continue to follow for discharge needs.  Tera Mater, RN, BSN Case Manager # [phone]   09-22-11 3:00pm Avie Arenas, [phone] UR completed.

## 2011-09-27 DIAGNOSIS — R531 Weakness: Secondary | ICD-10-CM

## 2011-09-27 DIAGNOSIS — J189 Pneumonia, unspecified organism: Secondary | ICD-10-CM

## 2011-09-27 LAB — GLUCOSE, CAPILLARY
Glucose-Capillary: 211 mg/dL — ABNORMAL HIGH (ref 70–99)
Glucose-Capillary: 206 mg/dL — ABNORMAL HIGH (ref 70–99)

## 2011-09-27 MED ORDER — PREDNISONE 20 MG PO TABS
20.0000 mg | ORAL_TABLET | Freq: Every day | ORAL | Status: DC
  Administered 2011-09-27 – 2011-10-01 (×5): 20 mg via ORAL
  Filled 2011-09-27 (×6): qty 1

## 2011-09-27 MED ORDER — ALBUTEROL SULFATE (5 MG/ML) 0.5% IN NEBU
2.5000 mg | INHALATION_SOLUTION | Freq: Three times a day (TID) | RESPIRATORY_TRACT | Status: DC
  Administered 2011-09-27 – 2011-10-01 (×13): 2.5 mg via RESPIRATORY_TRACT
  Filled 2011-09-27 (×13): qty 0.5

## 2011-09-27 MED ORDER — MOXIFLOXACIN HCL 400 MG PO TABS
400.0000 mg | ORAL_TABLET | Freq: Every day | ORAL | Status: DC
  Administered 2011-09-27 – 2011-09-29 (×2): 400 mg via ORAL
  Filled 2011-09-27 (×4): qty 1

## 2011-09-27 MED ORDER — IPRATROPIUM BROMIDE 0.02 % IN SOLN
0.5000 mg | Freq: Three times a day (TID) | RESPIRATORY_TRACT | Status: DC
  Administered 2011-09-27 – 2011-10-01 (×13): 0.5 mg via RESPIRATORY_TRACT
  Filled 2011-09-27 (×13): qty 2.5

## 2011-09-27 NOTE — Progress Notes (Signed)
Occupational Therapy Treatment Patient Details Name: Joseph [name] MRN: [id number] DOB: May 15, 1930 Today's Date: 09/27/2011 12:21-12:41  OT Assessment/Plan OT Assessment/Plan Comments on Treatment Session: This 77 yo male making progress slowly; limited by fatigue. Will continue to benefit from acute OT and follow up at SNF level. OT Plan: Discharge plan needs to be updated OT Frequency: Min 2X/week Follow Up Recommendations: Skilled nursing facility Equipment Recommended: Defer to next venue OT Goals ADL Goals ADL Goal: Grooming - Progress: Progressing toward goals ADL Goal: Toilet Transfer - Progress: Progressing toward goals ADL Goal: Toileting - Clothing Manipulation - Progress: Progressing toward goals ADL Goal: Toileting - Hygiene - Progress: Progressing toward goals  OT Treatment Precautions/Restrictions  Precautions Precautions: Fall;Other (comment) (O2) Required Braces or Orthoses: No Restrictions Weight Bearing Restrictions: No   ADL ADL Eating/Feeding: Performed;Independent Where Assessed - Eating/Feeding: Chair Grooming: Performed;Wash/dry hands;Other (comment) (minguard A) Where Assessed - Grooming: Standing at sink Upper Body Bathing: Not assessed Lower Body Bathing: Not assessed Upper Body Dressing: Not assessed Lower Body Dressing: Not assessed Toilet Transfer: Performed;Moderate assistance;Other (comment) (Min A stand to sit, Mod A sit to stand) Toilet Transfer Method: Proofreader: Regular height toilet;Grab bars Toileting - Clothing Manipulation: Performed;Independent;Other (comment) (one gown) Where Assessed - Toileting Clothing Manipulation: Standing Toileting - Hygiene: Performed;Independent Where Assessed - Toileting Hygiene: Sit on 3-in-1 or toilet Tub/Shower Transfer: Not assessed Tub/Shower Transfer Method: Not assessed Equipment Used: Rolling walker Mobility  Bed Mobility Bed Mobility: Yes Sit to Supine - Left:  5: Supervision;HOB flat Transfers Transfers: Yes Sit to Stand: 4: Min assist;With upper extremity assist;With armrests;From chair/3-in-1 Stand to Sit: With upper extremity assist;4: Min assist;To toilet Exercises    End of Session OT - End of Session Equipment Utilized During Treatment: Other (comment) (RW) Activity Tolerance: Patient limited by fatigue Patient left: in bed;with call bell in reach;with bed alarm set General Behavior During Session: [facility] for tasks performed Cognition: [facility] for tasks performed  Evette Georges [phone] 09/27/2011, 1:56 PM

## 2011-09-27 NOTE — Progress Notes (Signed)
Physical Therapy Treatment Patient Details Name: Joseph [name] MRN: [id number] DOB: 1930/02/03 Today's Date: 09/27/2011  PT Assessment/Plan  PT - Assessment/Plan Comments on Treatment Session: Pt progressing with PT goals.  Pt states that plans are now for him to possibly to go STR-SNF prior to d/c home- will update d/c plans next session if this is confirmed.     PT Frequency: Min 3X/week Follow Up Recommendations: Home health PT Equipment Recommended:  (pt states he has a RW at home) PT Goals  Acute Rehab PT Goals PT Goal: Supine/Side to Sit - Progress: Met PT Goal: Sit to Stand - Progress: Progressing toward goal PT Goal: Stand to Sit - Progress: Progressing toward goal PT Goal: Ambulate - Progress: Progressing toward goal PT Goal: Perform Home Exercise Program - Progress: Progressing toward goal  PT Treatment Precautions/Restrictions  Precautions Precautions: Fall Precaution Comments: Desaturates with activity Required Braces or Orthoses: No Restrictions Weight Bearing Restrictions: No Mobility (including Balance) Bed Mobility Bed Mobility: Yes Supine to Sit: 6: Modified independent (Device/Increase time);HOB flat Sit to Supine - Left: 5: Supervision;HOB flat Transfers Sit to Stand: Other (comment);From bed;With upper extremity assist (Min guard (A); cues for hand placement with use of RW) Stand to Sit: Other (comment);To bed;To chair/3-in-1;With upper extremity assist;With armrests (Min guard (A)) Stand to Sit Details: cues for hand placement & use of UE's to control descent.  Ambulation/Gait Ambulation/Gait Assistance: Other (comment) (Min Guard (A)) Ambulation/Gait Assistance Details (indicate cue type and reason): Cues for pursed lip breathing; used RW per pt request- states he feels more comfortable with RW due to weakness.  Seated rest breaks between trials due to fatigue.  pt on 2L supplemental 02 entire session Ambulation Distance (Feet):  (10' + 20' + 10'  ) Assistive device: Rolling walker Gait Pattern: Step-through pattern;Decreased step length - right;Decreased step length - left;Decreased stride length Stairs: No Wheelchair Mobility Wheelchair Mobility: No    Exercise  General Exercises - Lower Extremity Ankle Circles/Pumps: AROM;Supine;10 reps;Both Long Arc Quad: Strengthening;Both;10 reps;Seated Hip Flexion/Marching: Strengthening;Both;10 reps;Seated Heel Raises: Strengthening;10 reps;Both;Seated End of Session PT - End of Session Equipment Utilized During Treatment: Gait belt Activity Tolerance: Patient limited by fatigue Patient left: in chair;with call bell in reach General Behavior During Session: [facility] for tasks performed Cognition: [facility] for tasks performed  Lara Mulch 09/27/2011, 2:31 PM [phone]

## 2011-09-27 NOTE — Plan of Care (Signed)
Problem: Phase I Progression Outcomes Goal: Progress activity as tolerated unless otherwise ordered Outcome: Completed/Met Date Met:  09/27/11 Pt making slow progress with activity secondary to fatigue.  Joseph [name], [place] [phone] 09/27/2011

## 2011-09-27 NOTE — Progress Notes (Signed)
Subjective: Pt feel some better Weakness    Objective: Vital signs in last 24 hours: Temp:  [97.6 F (36.4 C)-97.8 F (36.6 C)] 97.6 F (36.4 C) (12/24 0500) Pulse Rate:  [92-116] 92  (12/24 0500) Resp:  [20-22] 20  (12/24 0500) BP: (91-120)/(59-68) 120/68 mmHg (12/24 0500) SpO2:  [92 %-96 %] 92 % (12/24 0500) Weight change:  Last BM Date: 09/26/11  Intake/Output from previous day: 12/23 0701 - 12/24 0700 In: 740 [P.O.:740] Out: 1450 [Urine:1450] Intake/Output this shift:    General appearance: alert Resp: clear to auscultation bilaterally Cardio: regular rate and rhythm, S1, S2 normal, no murmur, click, rub or gallop Extremities: extremities normal, atraumatic, no cyanosis or edema  Lab Results: No results found for this basename: WBC:2,HGB:2,HCT:2,PLT:2 in the last 72 hours BMET No results found for this basename: NA:2,K:2,CL:2,CO2:2,GLUCOSE:2,BUN:2,CREATININE:2,CALCIUM:2 in the last 72 hours  Studies/Results: No results found.  Medications: I have reviewed the patient's current medications.  Assessment/Plan: PNA RLL CHANGE ABX TOAVELOX PO D/C ROCEPHIN AND ZITHROMAX COPD NEB; O2 DM GLUSOCE HIGH DUE TO STEROID SLOWLY TAPER WEAKNESS/FTT NEED SNF SOCIAL WORK CONSULT PT CONSULT/ CAD STABLE BP THRUSH CONTINUE MOUTH WASH  LOS: 8 days   Joseph [name] 09/27/2011, 7:42 AM

## 2011-09-28 LAB — GLUCOSE, CAPILLARY
Glucose-Capillary: 83 mg/dL (ref 70–99)
Glucose-Capillary: 327 mg/dL — ABNORMAL HIGH (ref 70–99)

## 2011-09-28 MED ORDER — NYSTATIN 100000 UNIT/ML MT SUSP
5.0000 mL | Freq: Four times a day (QID) | OROMUCOSAL | Status: DC
  Administered 2011-09-28 – 2011-10-01 (×11): 500000 [IU] via ORAL
  Filled 2011-09-28 (×18): qty 5

## 2011-09-28 NOTE — Progress Notes (Signed)
Patient ID: Joseph [name], male   DOB: 17-Dec-1929, 77 y.o.   MRN: [id number] Subjective: C/o sore in mouth Thrush on mycelex troches OOB- AMBULATE   Objective: Vital signs in last 24 hours: Temp:  [97.6 F (36.4 C)-97.9 F (36.6 C)] 97.6 F (36.4 C) (12/25 0600) Pulse Rate:  [60-90] 90  (12/25 0600) Resp:  [20] 20  (12/25 0600) BP: (92-116)/(54-67) 92/54 mmHg (12/25 0600) SpO2:  [94 %-98 %] 94 % (12/25 0900) FiO2 (%):  [28 %] 28 % (12/25 0900) Weight:  [61.2 kg (134 lb 14.7 oz)] 134 lb 14.7 oz (61.2 kg) (12/25 0500)   Intake/Output from previous day:      General appearance: alert Throat: THRUSH Resp: clear to auscultation bilaterally Cardio: regular rate and rhythm, S1, S2 normal, no murmur, click, rub or gallop GI: soft, non-tender; bowel sounds normal; no masses,  no organomegaly  Lab Results: No results found for this basename: WBC:2,HGB:2,HCT:2,PLT:2 in the last 72 hours BMET No results found for this basename: NA:2,K:2,CL:2,CO2:2,GLUCOSE:2,BUN:2,CREATININE:2,CALCIUM:2 in the last 72 hours Lab Results  Component Value Date   ALT 14 09/20/2011   AST 12 09/20/2011   ALKPHOS 71 09/20/2011   BILITOT 0.3 09/20/2011    Assessment/Plan:  Active Problems:  COPD (chronic obstructive pulmonary disease) CONTINUE NEB; O2  Acute-on-chronic respiratory failure IMPROVED; PNA- PO ABX  CAD (coronary artery disease), native coronary artery  Diabetes mellitus BS OK  Thrush ADD NYSTATIN MOUTH RINSE;   Weakness generalized PT  Pneumonia PO AVELOX  Temeca Somma 09/28/2011, 9:49 AM

## 2011-09-29 ENCOUNTER — Inpatient Hospital Stay (HOSPITAL_COMMUNITY): Payer: Medicare Other

## 2011-09-29 LAB — GLUCOSE, CAPILLARY
Glucose-Capillary: 84 mg/dL (ref 70–99)
Glucose-Capillary: 154 mg/dL — ABNORMAL HIGH (ref 70–99)

## 2011-09-29 NOTE — Progress Notes (Signed)
Subjective: Alert,weak, swallowing better!  Wants to go home. ?SNF first...for strengthening...  Objective: Weight change: -0.7 kg (-1 lb 8.7 oz)  Intake/Output Summary (Last 24 hours) at 09/29/11 0820 Last data filed at 09/29/11 0747  Gross per 24 hour  Intake    200 ml  Output   1350 ml  Net  -1150 ml   General appearance: alert  Throat: thrush resolved Resp: clear to auscultation bilaterally  Cardio: regular rate and rhythm, S1, S2 normal, no murmur, click, rub or gallop  GI: soft, non-tender; bowel sounds normal; no masses, no organomegaly    Lab Results: No results found for this basename: NA:2,K:2,CL:2,CO2:2,GLUCOSE:2,BUN:2,CREATININE:2,CALCIUM:2,MG:2,PHOS:2 in the last 72 hours No results found for this basename: AST:2,ALT:2,ALKPHOS:2,BILITOT:2,PROT:2,ALBUMIN:2 in the last 72 hours No results found for this basename: LIPASE:2,AMYLASE:2 in the last 72 hours No results found for this basename: WBC:2,NEUTROABS:2,HGB:2,HCT:2,MCV:2,PLT:2 in the last 72 hours No results found for this basename: CKTOTAL:3,CKMB:3,CKMBINDEX:3,TROPONINI:3 in the last 72 hours No components found with this basename: POCBNP:3 No results found for this basename: DDIMER:2 in the last 72 hours No results found for this basename: HGBA1C:2 in the last 72 hours No results found for this basename: CHOL:2,HDL:2,LDLCALC:2,TRIG:2,CHOLHDL:2,LDLDIRECT:2 in the last 72 hours No results found for this basename: TSH,T4TOTAL,FREET3,T3FREE,THYROIDAB in the last 72 hours No results found for this basename: VITAMINB12:2,FOLATE:2,FERRITIN:2,TIBC:2,IRON:2,RETICCTPCT:2 in the last 72 hours  Studies/Results: No results found. Medications: Scheduled Meds:   . albuterol  2.5 mg Nebulization TID  . aspirin EC  81 mg Oral Daily  . clotrimazole  10 mg Oral TID  . dorzolamide  1 drop Left Eye QAC breakfast  . enoxaparin  40 mg Subcutaneous Daily  . ezetimibe  10 mg Oral Daily  . feeding supplement  1 Container Oral BID BM   . feeding supplement  1 Container Oral BID  . insulin aspart  0-15 Units Subcutaneous TID WC  . insulin glargine  10 Units Subcutaneous QHS  . ipratropium  0.5 mg Nebulization TID  . LORazepam  0.5 mg Oral BID  . metFORMIN  500 mg Oral BID WC  . moxifloxacin  400 mg Oral q1800  . nystatin  5 mL Oral QID  . predniSONE  20 mg Oral Q breakfast  . senna-docusate  2 tablet Oral BID  . Tamsulosin HCl  0.4 mg Oral Daily  . Travoprost (BAK Free)  1 drop Both Eyes QHS   Continuous Infusions:  PRN Meds:.sodium chloride, acetaminophen, acetaminophen, ALPRAZolam, alum & mag hydroxide-simeth, morphine injection  Assessment/Plan: Patient Active Problem List  Diagnoses Date Noted  . Weakness generalized PT/OT - SNF for rehab 09/27/2011  . Pneumonia - Day 10 Abx tx - ?d/c Avelox in am 09/27/2011  . Diabetes mellitus - ok 09/26/2011  . Thrush - better 09/26/2011  . COPD (chronic obstructive pulmonary disease) - pred taper and nebs 09/19/2011  . Acute-on-chronic respiratory failure - better 09/19/2011  . CAD (coronary artery disease), native coronary artery - asx 09/19/2011  . Peripheral vascular disease -aware Disp - SNF for strengthening 09/19/2011     LOS: 10 days   Joseph [name] 09/29/2011, 8:20 AM

## 2011-09-29 NOTE — Progress Notes (Signed)
Physical Therapy Treatment Patient Details Name: Joseph [name] MRN: [id number] DOB: [birth year] Today's Date: 09/29/2011  PT Assessment/Plan  PT - Assessment/Plan Comments on Treatment Session: Making progress; discussed discharge plans. Pt wants to d/c home, yet recognizes he is not yet strong enough to care for himself or his wife. Unsure if he could stay with his daughter or son (wife currently staying with them) or if they would come to stay at his home to help care for wife.  Pt reports he and MD will make decision tomorrow. PT Plan: Discharge plan needs to be updated;Frequency remains appropriate PT Frequency: Min 3X/week Follow Up Recommendations: Skilled nursing facility (could do HHPT if family can provide 24 hr assist) Equipment Recommended: None recommended by PT PT Goals  Acute Rehab PT Goals PT Goal: Rolling Supine to Left Side - Progress: Not met PT Goal: Supine/Side to Sit - Progress: Met PT Goal: Sit to Supine/Side - Progress: Met PT Goal: Sit to Stand - Progress: Progressing toward goal PT Goal: Stand to Sit - Progress: Progressing toward goal PT Goal: Ambulate - Progress: Progressing toward goal PT Goal: Perform Home Exercise Program - Progress: Progressing toward goal  PT Treatment Precautions/Restrictions  Precautions Precautions: Fall Precaution Comments: Desaturates with activity Required Braces or Orthoses: No Restrictions Weight Bearing Restrictions: No Mobility (including Balance) Bed Mobility Rolling Left: 6: Modified independent (Device/Increase time);With rail Left Sidelying to Sit: 6: Modified independent (Device/Increase time);With rails;HOB flat Left Sidelying to Sit Details (indicate cue type and reason): pt "kicks" legs to gain momentum Sit to Supine - Left: 5: Supervision;HOB flat Sit to Supine - Left Details (indicate cue type and reason): pt unable to raise legs on first attempt; used momentum to swing legs up onto bed  Transfers Sit to  Stand: With upper extremity assist;From bed;From toilet;Other (comment) (minguard A with cues for safe hand placement ) Stand to Sit: Other (comment);To bed;To toilet Stand to Sit Details: minguard assist cues for safe use of RW Ambulation/Gait Ambulation/Gait Assistance: 5: Supervision Ambulation/Gait Assistance Details (indicate cue type and reason): safe use of RW with ambulation;  Ambulation Distance (Feet): 14 Feet (to/from bathroom, 14 ft twice) Assistive device: Rolling walker Gait Pattern: Step-through pattern;Decreased stride length    Exercise  General Exercises - Lower Extremity Ankle Circles/Pumps: AROM;Both;10 reps;Supine Straight Leg Raises: AROM;Left;5 reps;Supine (pt fatigued after ambulation and bridging exercises ) Other Exercises Other Exercises: bridging with HOB 10 (to assist with breathing) x 3 End of Session PT - End of Session Activity Tolerance: Patient limited by fatigue Patient left: in bed;with call bell in reach;with bed alarm set;Other (comment) (pt refused up in chair due to "busy morning" and tired) General Behavior During Session: [facility] for tasks performed  Romari Gasparro 09/29/2011, 3:12 PM Pager [phone]

## 2011-09-30 LAB — GLUCOSE, CAPILLARY
Glucose-Capillary: 84 mg/dL (ref 70–99)
Glucose-Capillary: 187 mg/dL — ABNORMAL HIGH (ref 70–99)

## 2011-09-30 LAB — CBC: HCT: 33.7 % — ABNORMAL LOW (ref 39.0–52.0)

## 2011-09-30 LAB — DIFFERENTIAL
Lymphocytes Relative: 14 % (ref 12–46)
Lymphs Abs: 2 10*3/uL (ref 0.7–4.0)
Monocytes Relative: 12 % (ref 3–12)
Neutro Abs: 10.2 10*3/uL — ABNORMAL HIGH (ref 1.7–7.7)
Neutrophils Relative %: 72 % (ref 43–77)

## 2011-09-30 LAB — BASIC METABOLIC PANEL
BUN: 23 mg/dL (ref 6–23)
CO2: 29 mEq/L (ref 19–32)
Calcium: 8.8 mg/dL (ref 8.4–10.5)
Chloride: 94 mEq/L — ABNORMAL LOW (ref 96–112)
Creatinine, Ser: 0.66 mg/dL (ref 0.50–1.35)

## 2011-09-30 LAB — PRO B NATRIURETIC PEPTIDE: Pro B Natriuretic peptide (BNP): 126.8 pg/mL (ref 0–450)

## 2011-09-30 MED ORDER — PRO-STAT SUGAR FREE PO LIQD
30.0000 mL | Freq: Two times a day (BID) | ORAL | Status: DC
  Administered 2011-10-01: 30 mL via ORAL
  Filled 2011-09-30 (×5): qty 30

## 2011-09-30 NOTE — Progress Notes (Signed)
Nutrition Follow-up  Eating 75-100% of meals. RN reports pt not taking supplements, eating well.  Diet Order:  Dys 3 with thin liquids  Supplements: Ensure Pudding PO BID, Resource Breeze PO BID  Meds: Scheduled Meds:   . albuterol  2.5 mg Nebulization TID  . aspirin EC  81 mg Oral Daily  . clotrimazole  10 mg Oral TID  . dorzolamide  1 drop Left Eye QAC breakfast  . enoxaparin  40 mg Subcutaneous Daily  . ezetimibe  10 mg Oral Daily  . feeding supplement  1 Container Oral BID BM  . feeding supplement  1 Container Oral BID  . insulin aspart  0-15 Units Subcutaneous TID WC  . insulin glargine  10 Units Subcutaneous QHS  . ipratropium  0.5 mg Nebulization TID  . LORazepam  0.5 mg Oral BID  . metFORMIN  500 mg Oral BID WC  . moxifloxacin  400 mg Oral q1800  . nystatin  5 mL Oral QID  . predniSONE  20 mg Oral Q breakfast  . senna-docusate  2 tablet Oral BID  . Tamsulosin HCl  0.4 mg Oral Daily  . Travoprost (BAK Free)  1 drop Both Eyes QHS   Continuous Infusions:  PRN Meds:.acetaminophen, acetaminophen, ALPRAZolam, alum & mag hydroxide-simeth  Labs:  CMP     Component Value Date/Time   NA 130* 09/30/2011 0635   K 4.8 09/30/2011 0635   CL 94* 09/30/2011 0635   CO2 29 09/30/2011 0635   GLUCOSE 71 09/30/2011 0635   BUN 23 09/30/2011 0635   CREATININE 0.66 09/30/2011 0635   CALCIUM 8.8 09/30/2011 0635   PROT 6.2 09/20/2011 0415   ALBUMIN 3.3* 09/20/2011 0415   AST 12 09/20/2011 0415   ALT 14 09/20/2011 0415   ALKPHOS 71 09/20/2011 0415   BILITOT 0.3 09/20/2011 0415   GFRNONAA 90* 09/30/2011 0635   GFRAA >90 09/30/2011 0635     Intake/Output Summary (Last 24 hours) at 09/30/11 0932 Last data filed at 09/30/11 0746  Gross per 24 hour  Intake    240 ml  Output    425 ml  Net   -185 ml    Weight Status:  60.2 kg, wt stable  Nutrition Dx:  Malnutrition - ongoing.  Goal:  Oral intake to meet nutrition needs - met.  Intervention:   1. D/c Resource Breeze PO  BID 2. D/c Ensure Pudding PO BID 3. 30 ml Prostat BID 3. RD to follow nutrition care plan  Monitor:  PO intake, weights, labs, I/O's  Adair Laundry Pager #:  [phone]

## 2011-09-30 NOTE — Progress Notes (Signed)
Subjective: Slowly feeling better  Objective: Weight change: -0.3 kg (-10.6 oz)  Intake/Output Summary (Last 24 hours) at 09/30/11 1625 Last data filed at 09/30/11 1500  Gross per 24 hour  Intake    960 ml  Output    625 ml  Net    335 ml    General appearance: alert  Throat: thrush resolved  Resp: clear to auscultation bilaterally but BSs very diminished throughout  Cardio: regular rate and rhythm, S1, S2 normal, no murmur, click, rub or gallop  GI: soft, non-tender; bowel sounds normal; no masses, no organomegaly EXT: no cyanosis or edema Neuro: OX3, non-focal     Lab Results:  Scnetx 09/30/11 0635  NA 130*  K 4.8  CL 94*  CO2 29  GLUCOSE 71  BUN 23  CREATININE 0.66  CALCIUM 8.8  MG --  PHOS --   No results found for this basename: AST:2,ALT:2,ALKPHOS:2,BILITOT:2,PROT:2,ALBUMIN:2 in the last 72 hours No results found for this basename: LIPASE:2,AMYLASE:2 in the last 72 hours  Basename 09/30/11 0635  WBC 14.2*  NEUTROABS 10.2*  HGB 11.8*  HCT 33.7*  MCV 92.1  PLT 266   No results found for this basename: CKTOTAL:3,CKMB:3,CKMBINDEX:3,TROPONINI:3 in the last 72 hours No components found with this basename: POCBNP:3 No results found for this basename: DDIMER:2 in the last 72 hours No results found for this basename: HGBA1C:2 in the last 72 hours No results found for this basename: CHOL:2,HDL:2,LDLCALC:2,TRIG:2,CHOLHDL:2,LDLDIRECT:2 in the last 72 hours No results found for this basename: TSH,T4TOTAL,FREET3,T3FREE,THYROIDAB in the last 72 hours No results found for this basename: VITAMINB12:2,FOLATE:2,FERRITIN:2,TIBC:2,IRON:2,RETICCTPCT:2 in the last 72 hours  Studies/Results: Dg Chest 2 View  09/29/2011  *RADIOLOGY REPORT*  Clinical Data: Pneumonia.  CHEST - 2 VIEW  Comparison: 09/23/2011  Findings: Advanced COPD with pulmonary hyperinflation and pulmonary scarring.  Negative for pneumonia.  Negative for heart failure. Improved aeration compared with the  prior study.  IMPRESSION: COPD.  No acute cardiopulmonary disease.  Original Report Authenticated By: Camelia Phenes, M.D.   Medications: Scheduled Meds:   . albuterol  2.5 mg Nebulization TID  . aspirin EC  81 mg Oral Daily  . clotrimazole  10 mg Oral TID  . dorzolamide  1 drop Left Eye QAC breakfast  . enoxaparin  40 mg Subcutaneous Daily  . ezetimibe  10 mg Oral Daily  . feeding supplement  30 mL Oral BID  . insulin aspart  0-15 Units Subcutaneous TID WC  . insulin glargine  10 Units Subcutaneous QHS  . ipratropium  0.5 mg Nebulization TID  . LORazepam  0.5 mg Oral BID  . metFORMIN  500 mg Oral BID WC  . moxifloxacin  400 mg Oral q1800  . nystatin  5 mL Oral QID  . predniSONE  20 mg Oral Q breakfast  . senna-docusate  2 tablet Oral BID  . Tamsulosin HCl  0.4 mg Oral Daily  . Travoprost (BAK Free)  1 drop Both Eyes QHS  . DISCONTD: feeding supplement  1 Container Oral BID BM  . DISCONTD: feeding supplement  1 Container Oral BID   Continuous Infusions:  PRN Meds:.acetaminophen, acetaminophen, ALPRAZolam, alum & mag hydroxide-simeth  Assessment/Plan: Weakness generalized PT/OT - SNF for rehab  09/27/2011  .  Pneumonia - Day 11 Abx tx - Avelox d/c'd  09/27/2011  .  Diabetes mellitus - ok  09/26/2011  .  Thrush - better  09/26/2011  .  COPD (chronic obstructive pulmonary disease) - pred taper and nebs - RA O2 drops  below 90% at night - check pulse ox tonight 09/19/2011  .  Acute-on-chronic respiratory failure - better  09/19/2011  .  CAD (coronary artery disease), native coronary artery - asx  09/19/2011  . Peripheral vascular disease -aware  Disp - SNF for strengthening  09/19/2011      LOS: 11 days   Joseph [name] 09/30/2011, 4:25 PM

## 2011-09-30 NOTE — Progress Notes (Signed)
Physical Therapy Treatment Patient Details Name: Joseph [name] MRN: [id number] DOB: 1930/08/11 Today's Date: 09/30/2011  PT Assessment/Plan  PT - Assessment/Plan Comments on Treatment Session: Pt still unsure of level of care that will be able to be provided by family- pt states MD is to speak with family later today & make decision.   Follow Up Recommendations: Skilled nursing facility (could do HHPT if family can provide 24 hr assist) Equipment Recommended: None recommended by PT PT Goals  Acute Rehab PT Goals PT Goal: Supine/Side to Sit - Progress: Met PT Goal: Sit to Stand - Progress: Met PT Goal: Stand to Sit - Progress: Met PT Goal: Ambulate - Progress: Progressing toward goal PT Goal: Perform Home Exercise Program - Progress: Progressing toward goal  PT Treatment Precautions/Restrictions  Precautions Precautions: Fall Precaution Comments: Desaturates with activity Required Braces or Orthoses: No Restrictions Weight Bearing Restrictions: No Mobility (including Balance) Bed Mobility Rolling Left: 6: Modified independent (Device/Increase time) Transfers Sit to Stand: 5: Supervision;With upper extremity assist;From bed;From chair/3-in-1;With armrests Sit to Stand Details (indicate cue type and reason): cues for safe hand placement  Stand to Sit: 5: Supervision;With upper extremity assist;With armrests;To bed;To chair/3-in-1 Stand to Sit Details: cues for hand placement Ambulation/Gait Ambulation/Gait Assistance: 5: Supervision Ambulation/Gait Assistance Details (indicate cue type and reason): (S) for safety; ambulated without supplemental 02- 02 sats 92% Ambulation Distance (Feet): 40 Feet (2 trials) Assistive device: Rolling walker Gait Pattern: Step-through pattern;Decreased stride length Stairs: No Wheelchair Mobility Wheelchair Mobility: No    Exercise  General Exercises - Lower Extremity Ankle Circles/Pumps: AROM;15 reps;Both;Supine Long Arc Quad:  AROM;Strengthening;15 reps;Seated Heel Slides: AROM;Strengthening;15 reps;Both;Supine Straight Leg Raises: AROM;Strengthening;15 reps;Both;Supine Hip Flexion/Marching: AROM;Strengthening;Both;Seated (15 reps) Toe Raises: AROM;Strengthening;Both;15 reps;Seated Heel Raises: AROM;Strengthening;Both;15 reps;Seated End of Session PT - End of Session Equipment Utilized During Treatment: Gait belt Activity Tolerance: Patient tolerated treatment well Patient left: in chair;with call bell in reach Nurse Communication: Mobility status for transfers;Mobility status for ambulation General Behavior During Session: [facility] for tasks performed Cognition: [facility] for tasks performed  Lara Mulch 09/30/2011, 2:53 PM [phone]

## 2011-09-30 NOTE — Progress Notes (Signed)
Patient referred for short term SNF. Met with patient, wife and 2 sons today to discuss. Patient originally wanted to go home but is now agreeable to short term SNF. Wants [facility]. Placement process discussed and will be initiated.  Yellow assessment and Fl2 will be placed in shadow chart for MD's signature.  MD: Please advise re: date of stability. Darylene Price, BSW, 09/30/2011 1:03 PM

## 2011-09-30 NOTE — Progress Notes (Signed)
Bed offer received and accepted by [facility]. Plan d/c to SNF tomorrow if medically stable per MD. Notified pt's son-in-law- Richard of above and he will relay to pt's daughter and pt's wife.  Son does not feel comfortable transporting patient via car; patient did not want to go via EMS.  May need O2 in transport.  Darylene Price, BSW, 09/30/2011 4:32 PM

## 2011-10-01 LAB — GLUCOSE, CAPILLARY
Glucose-Capillary: 198 mg/dL — ABNORMAL HIGH (ref 70–99)
Glucose-Capillary: 172 mg/dL — ABNORMAL HIGH (ref 70–99)

## 2011-10-01 MED ORDER — NYSTATIN 100000 UNIT/ML MT SUSP: 5.0000 mL | Freq: Four times a day (QID) | OROMUCOSAL | Status: AC

## 2011-10-01 MED ORDER — ACETAMINOPHEN 325 MG PO TABS: 650.0000 mg | ORAL_TABLET | Freq: Four times a day (QID) | ORAL | Status: DC | PRN

## 2011-10-01 MED ORDER — PREDNISONE 20 MG PO TABS: 20.0000 mg | ORAL_TABLET | Freq: Every day | ORAL | Status: DC

## 2011-10-01 MED ORDER — INSULIN ASPART 100 UNIT/ML ~~LOC~~ SOLN: 0.0000 [IU] | Freq: Three times a day (TID) | SUBCUTANEOUS | Status: DC

## 2011-10-01 MED ORDER — ALPRAZOLAM 0.25 MG PO TABS: 0.2500 mg | ORAL_TABLET | Freq: Three times a day (TID) | ORAL | Status: AC | PRN

## 2011-10-01 MED ORDER — INSULIN GLARGINE 100 UNIT/ML ~~LOC~~ SOLN: 10.0000 [IU] | Freq: Every day | SUBCUTANEOUS | Status: AC

## 2011-10-01 MED ORDER — ALBUTEROL SULFATE (5 MG/ML) 0.5% IN NEBU: 2.5000 mg | INHALATION_SOLUTION | Freq: Three times a day (TID) | RESPIRATORY_TRACT | Status: DC

## 2011-10-01 MED ORDER — LISINOPRIL 20 MG PO TABS: 10.0000 mg | ORAL_TABLET | Freq: Every day | ORAL | Status: DC

## 2011-10-01 MED ORDER — IPRATROPIUM BROMIDE 0.02 % IN SOLN: 0.5000 mg | Freq: Three times a day (TID) | RESPIRATORY_TRACT | Status: DC

## 2011-10-01 MED ORDER — ASPIRIN 81 MG PO TBEC: 81.0000 mg | DELAYED_RELEASE_TABLET | Freq: Every day | ORAL | Status: DC

## 2011-10-01 MED ORDER — DORZOLAMIDE HCL 2 % OP SOLN: 1.0000 [drp] | Freq: Every day | OPHTHALMIC | Status: AC

## 2011-10-01 NOTE — Progress Notes (Signed)
Ok per MD for d/c today to [facility] for SNF rehab via EMS. Ok per patient, family, SNF and nursing.  DC summary info sent to SNF for review.  Darylene Price, BSW, 10/01/2011 9:21 AM

## 2011-10-01 NOTE — Discharge Summary (Signed)
Physician Discharge Summary  NAME:Joseph [name]  WUJ:[id number]  DOB: 03/17/1930   Admit date: 09/19/2011 Discharge date: 10/01/2011  Discharge Diagnoses:  Active Problems:  COPD (chronic obstructive pulmonary disease)  Acute-on-chronic respiratory failure  CAD (coronary artery disease), native coronary artery  Diabetes mellitus  Thrush  Weakness generalized  Pneumonia  Glaucoma  Anxiety   Discharge Condition: Improved but still very weak  Hospital Course:   Joseph [name] is a very pleasant 77 year old male with COPD, coronary artery disease, type 2 diabetes mellitus, and admission for pneumonia that failed outpatient therapy. He required CPAP during this admission but was able to be weaned. Pneumonia has resolved and COPD symptoms have greatly improved. He did develop oral thrush which is symptomatically improving. He started to eat better and slowly feel stronger but needs physical therapy and occupational therapy in a skilled nursing facility prior to hopefully returning home in the next couple of weeks.  Hypertension, diabetes, and anxiety are well controlled.  Will continue to taper off prednisone at the skilled nursing facility.  Consults:    Disposition: Another Health Care Institution Not Defined  Discharge Orders    Future Appointments: Provider: Department: Dept Phone: Center:   12/01/2011 8:00 AM [facility] [phone] [facility]   12/01/2011 9:00 AM [facility] [phone] [facility]   12/01/2011 9:30 AM Chuck Hint, MD [facility] [phone] [facility]     Future Orders Please Complete By Expires   Diet - low sodium heart healthy      Scheduling Instructions:   No concentrated sweets, low salt, chopped meats   Increase activity slowly      Discharge instructions      Comments:   Increase activity slowly. Continue physical therapy and occupational therapy at the skilled nursing facility. Check oxygen saturations  every shift for several days to make sure that O2 saturations do not drop below 88% on room air     Current Discharge Medication List    START taking these medications   Details  acetaminophen (TYLENOL) 325 MG tablet Take 2 tablets (650 mg total) by mouth every 6 (six) hours as needed (or Fever >/= 101). Qty: 30 tablet, Refills: 0    albuterol (PROVENTIL) (5 MG/ML) 0.5% nebulizer solution Take 0.5 mLs (2.5 mg total) by nebulization 3 (three) times daily. Qty: 0.5 mL, Refills: 0    ALPRAZolam (XANAX) 0.25 MG tablet Take 1 tablet (0.25 mg total) by mouth 3 (three) times daily as needed for anxiety. Qty: 30 tablet, Refills: 0    aspirin EC 81 MG EC tablet Take 1 tablet (81 mg total) by mouth daily. Qty: 1 tablet, Refills: 0    dorzolamide (TRUSOPT) 2 % ophthalmic solution Place 1 drop into the left eye daily before breakfast. Qty: 10 mL, Refills: 0    insulin aspart (NOVOLOG) 100 UNIT/ML injection Inject 0-15 Units into the skin 3 (three) times daily with meals. Qty: 1 vial, Refills: 0    insulin glargine (LANTUS) 100 UNIT/ML injection Inject 10 Units into the skin at bedtime. Qty: 10 mL, Refills: 0    ipratropium (ATROVENT) 0.02 % nebulizer solution Take 2.5 mLs (0.5 mg total) by nebulization 3 (three) times daily. Qty: 75 mL, Refills: 0    nystatin (MYCOSTATIN) 100000 UNIT/ML suspension Take 5 mLs (500,000 Units total) by mouth 4 (four) times daily. Qty: 60 mL, Refills: 0      CONTINUE these medications which have CHANGED   Details  lisinopril (PRINIVIL,ZESTRIL) 20 MG  tablet Take 0.5 tablets (10 mg total) by mouth daily. Qty: 30 tablet, Refills: 0    predniSONE (DELTASONE) 20 MG tablet Take 1 tablet (20 mg total) by mouth daily with breakfast. Qty: 30 tablet, Refills: 0      CONTINUE these medications which have NOT CHANGED   Details  budesonide (PULMICORT FLEXHALER) 180 MCG/ACT inhaler Inhale 1 puff into the lungs 2 (two) times daily.      brinzolamide (AZOPT) 1 %  ophthalmic suspension Place 1 drop into the left eye every morning.      ezetimibe (ZETIA) 10 MG tablet Take 10 mg by mouth daily.     metFORMIN (GLUCOPHAGE) 500 MG tablet Take 500 mg by mouth 2 (two) times daily with a meal.      Tamsulosin HCl (FLOMAX) 0.4 MG CAPS Take 0.4 mg by mouth daily.      travoprost, benzalkonium, (TRAVATAN) 0.004 % ophthalmic solution Place 1 drop into both eyes at bedtime.       STOP taking these medications     levofloxacin (LEVAQUIN) 500 MG tablet      aspirin 81 MG tablet        Follow-up Information    Follow up with Deby Adger NEVILL, MD .         Things to follow up in the outpatient setting: Monitor O2 saturations and use nasal cannula oxygen if O2 sat trace drop below 88% on room air. Nighttime episodic monitoring may be prudent to to decide on whether or not he needs nocturnal nasal cannula O2  Time coordinating discharge: 40 minutes  The results of significant diagnostics from this hospitalization (including imaging, microbiology, ancillary and laboratory) are listed below for reference.    Significant Diagnostic Studies: Dg Chest 2 View  09/29/2011  *RADIOLOGY REPORT*  Clinical Data: Pneumonia.  CHEST - 2 VIEW  Comparison: 09/23/2011  Findings: Advanced COPD with pulmonary hyperinflation and pulmonary scarring.  Negative for pneumonia.  Negative for heart failure. Improved aeration compared with the prior study.  IMPRESSION: COPD.  No acute cardiopulmonary disease.  Original Report Authenticated By: Camelia Phenes, M.D.   Dg Chest Port 1 View  09/23/2011  *RADIOLOGY REPORT*  Clinical Data: Intubated  PORTABLE CHEST - 1 VIEW  Comparison: 09/21/2011  Findings: An endotracheal tube is not visualized. Hyperinflation with interstitial prominence, similar to prior. Improved aeration of the right lower lobe opacity.  Stable cardiomediastinal contours.  No pneumothorax or pleural effusion.  No acute osseous abnormality.  IMPRESSION:  No  endotracheal tube visualized.  Discussed with the patient's nurse, Dawn, at 07:05 a.m. on 09/23/2011.  The patient is not intubated.  Improved aeration of the right lower lobe opacity.  Otherwise no significant interval change.  COPD.  Original Report Authenticated By: Waneta Martins, M.D.   Dg Chest Port 1 View  09/21/2011  *RADIOLOGY REPORT*  Clinical Data: Infiltrates.  PORTABLE CHEST - 1 VIEW  Comparison: 09/20/2011  Findings: There is hyperinflation of the lungs compatible with COPD.  Heart is normal size.  The right basilar atelectasis or infiltrate, slightly increased since prior study.  Left lung is clear.  No effusions or acute bony abnormality.  IMPRESSION: COPD.  Right lower lobe atelectasis or infiltrate, increased since prior study.  Original Report Authenticated By: Cyndie Chime, M.D.   Portable Chest 1 View  09/20/2011  *RADIOLOGY REPORT*  Clinical Data: COPD  PORTABLE CHEST - 1 VIEW  Comparison: 09/19/2011; 10/15/2010  Findings: Grossly unchanged cardiac silhouette and mediastinal contours to  the lordotic projection.  The lungs remain hyperinflated. Improved aeration of the right lower lung.  Minimal left basilar linear heterogeneous opacities favored to represent subsegmental atelectasis or scar.  No focal airspace opacities.  No definite pleural effusion or pneumothorax.  Grossly unchanged bones.  IMPRESSION: 1.  Improved aeration of the right base suggests resolving atelectasis.  2.  Hyperexpanded lungs suggestive of emphysema.  Original Report Authenticated By: Waynard Reeds, M.D.   Dg Chest Port 1 View  09/19/2011  *RADIOLOGY REPORT*  Clinical Data: Shortness of breath.  Difficulty breathing.  PORTABLE CHEST - 1 VIEW  Comparison: 10/15/2010  Findings: The lungs are hyperinflated.  There are the [facility] changes especially at the apices.  Heart size is normal.  Crowded markings are identified at the bases. There is question of asymmetric infiltrate at the right lung base versus  chronic changes.  Degenerative changes are seen in the spine.  IMPRESSION:  1.  Marked emphysema. 2. Question of superimposed right lower lobe infiltrate.  Original Report Authenticated By: Patterson Hammersmith, M.D.    Microbiology: No results found for this or any previous visit (from the past 240 hour(s)).   Labs: Results for orders placed during the hospital encounter of 09/19/11  COMPREHENSIVE METABOLIC PANEL      Component Value Range   Sodium 135  135 - 145 (mEq/L)   Potassium 4.1  3.5 - 5.1 (mEq/L)   Chloride 96  96 - 112 (mEq/L)   CO2 31  19 - 32 (mEq/L)   Glucose, Bld 130 (*) 70 - 99 (mg/dL)   BUN 24 (*) 6 - 23 (mg/dL)   Creatinine, Ser 7.84  0.50 - 1.35 (mg/dL)   Calcium 9.8  8.4 - 69.6 (mg/dL)   Total Protein 7.0  6.0 - 8.3 (g/dL)   Albumin 3.7  3.5 - 5.2 (g/dL)   AST 12  0 - 37 (U/L)   ALT 15  0 - 53 (U/L)   Alkaline Phosphatase 81  39 - 117 (U/L)   Total Bilirubin 0.5  0.3 - 1.2 (mg/dL)   GFR calc non Af Amer 83 (*) >90 (mL/min)   GFR calc Af Amer >90  >90 (mL/min)  URINALYSIS, ROUTINE W REFLEX MICROSCOPIC      Component Value Range   Color, Urine YELLOW  YELLOW    APPearance CLEAR  CLEAR    Specific Gravity, Urine 1.022  1.005 - 1.030    pH 6.0  5.0 - 8.0    Glucose, UA 250 (*) NEGATIVE (mg/dL)   Hgb urine dipstick NEGATIVE  NEGATIVE    Bilirubin Urine NEGATIVE  NEGATIVE    Ketones, ur NEGATIVE  NEGATIVE (mg/dL)   Protein, ur NEGATIVE  NEGATIVE (mg/dL)   Urobilinogen, UA 0.2  0.0 - 1.0 (mg/dL)   Nitrite NEGATIVE  NEGATIVE    Leukocytes, UA NEGATIVE  NEGATIVE   CBC      Component Value Range   WBC 16.7 (*) 4.0 - 10.5 (K/uL)   RBC 5.35  4.22 - 5.81 (MIL/uL)   Hemoglobin 17.4 (*) 13.0 - 17.0 (g/dL)   HCT 29.5  28.4 - 13.2 (%)   MCV 92.1  78.0 - 100.0 (fL)   MCH 32.5  26.0 - 34.0 (pg)   MCHC 35.3  30.0 - 36.0 (g/dL)   RDW 44.0  10.2 - 72.5 (%)   Platelets 267  150 - 400 (K/uL)  DIFFERENTIAL      Component Value Range   Neutrophils Relative 79 (*) 43 -  77  (%)   Lymphocytes Relative 15  12 - 46 (%)   Monocytes Relative 6  3 - 12 (%)   Eosinophils Relative 0  0 - 5 (%)   Basophils Relative 0  0 - 1 (%)   Band Neutrophils 0  0 - 10 (%)   Metamyelocytes Relative 0     Myelocytes 0     Promyelocytes Absolute 0     Blasts 0     nRBC 0  0 (/100 WBC)   Neutro Abs 13.2 (*) 1.7 - 7.7 (K/uL)   Lymphs Abs 2.5  0.7 - 4.0 (K/uL)   Monocytes Absolute 1.0  0.1 - 1.0 (K/uL)   Eosinophils Absolute 0.0  0.0 - 0.7 (K/uL)   Basophils Absolute 0.0  0.0 - 0.1 (K/uL)   Smear Review MORPHOLOGY UNREMARKABLE    CARDIAC PANEL(CRET KIN+CKTOT+MB+TROPI)      Component Value Range   Total CK 17  7 - 232 (U/L)   CK, MB 2.8  0.3 - 4.0 (ng/mL)   Troponin I <0.30  <0.30 (ng/mL)   Relative Index RELATIVE INDEX IS INVALID  0.0 - 2.5   PRO B NATRIURETIC PEPTIDE      Component Value Range   Pro B Natriuretic peptide (BNP) 163.7  0 - 450 (pg/mL)  PROTIME-INR      Component Value Range   Prothrombin Time 14.1  11.6 - 15.2 (seconds)   INR 1.07  0.00 - 1.49   APTT      Component Value Range   aPTT 27  24 - 37 (seconds)  LACTIC ACID, PLASMA      Component Value Range   Lactic Acid, Venous 2.1  0.5 - 2.2 (mmol/L)  CULTURE, BLOOD (ROUTINE X 2)      Component Value Range   Specimen Description BLOOD RIGHT ARM     Special Requests BOTTLES DRAWN AEROBIC AND ANAEROBIC 10CC     Setup Time [id number]     Culture NO GROWTH 5 DAYS     Report Status 09/25/2011 FINAL    CULTURE, BLOOD (ROUTINE X 2)      Component Value Range   Specimen Description BLOOD LEFT HAND     Special Requests BOTTLES DRAWN AEROBIC ONLY 10CC     Setup Time [id number]     Culture NO GROWTH 5 DAYS     Report Status 09/25/2011 FINAL    POCT I-STAT 3, BLOOD GAS (G3+)      Component Value Range   pH, Arterial 7.375  7.350 - 7.450    pCO2 arterial 44.8  35.0 - 45.0 (mmHg)   pO2, Arterial 108.0 (*) 80.0 - 100.0 (mmHg)   Bicarbonate 26.2 (*) 20.0 - 24.0 (mEq/L)   TCO2 28  0 - 100 (mmol/L)   O2  Saturation 98.0     Collection site RADIAL, ALLEN'S TEST ACCEPTABLE     Drawn by Operator     Sample type ARTERIAL    CBC      Component Value Range   WBC 13.2 (*) 4.0 - 10.5 (K/uL)   RBC 4.96  4.22 - 5.81 (MIL/uL)   Hemoglobin 15.9  13.0 - 17.0 (g/dL)   HCT 21.3  08.6 - 57.8 (%)   MCV 92.1  78.0 - 100.0 (fL)   MCH 32.1  26.0 - 34.0 (pg)   MCHC 34.8  30.0 - 36.0 (g/dL)   RDW 46.9  62.9 - 52.8 (%)   Platelets 268  150 - 400 (K/uL)  BLOOD  GAS, ARTERIAL      Component Value Range   O2 Content 4.0     pH, Arterial 7.369  7.350 - 7.450    pCO2 arterial 47.6 (*) 35.0 - 45.0 (mmHg)   pO2, Arterial 66.6 (*) 80.0 - 100.0 (mmHg)   Bicarbonate 26.8 (*) 20.0 - 24.0 (mEq/L)   TCO2 28.2  0 - 100 (mmol/L)   Acid-Base Excess 2.0  0.0 - 2.0 (mmol/L)   O2 Saturation 91.9     Patient temperature 98.7     Collection site LEFT RADIAL     Drawn by [id number]     Sample type ARTERIAL DRAW     Allens test (pass/fail) PASS  PASS   MAGNESIUM      Component Value Range   Magnesium 1.9  1.5 - 2.5 (mg/dL)  PHOSPHORUS      Component Value Range   Phosphorus 3.3  2.3 - 4.6 (mg/dL)  COMPREHENSIVE METABOLIC PANEL      Component Value Range   Sodium 131 (*) 135 - 145 (mEq/L)   Potassium 4.9  3.5 - 5.1 (mEq/L)   Chloride 95 (*) 96 - 112 (mEq/L)   CO2 27  19 - 32 (mEq/L)   Glucose, Bld 288 (*) 70 - 99 (mg/dL)   BUN 23  6 - 23 (mg/dL)   Creatinine, Ser 0.45  0.50 - 1.35 (mg/dL)   Calcium 9.2  8.4 - 40.9 (mg/dL)   Total Protein 6.2  6.0 - 8.3 (g/dL)   Albumin 3.3 (*) 3.5 - 5.2 (g/dL)   AST 12  0 - 37 (U/L)   ALT 14  0 - 53 (U/L)   Alkaline Phosphatase 71  39 - 117 (U/L)   Total Bilirubin 0.3  0.3 - 1.2 (mg/dL)   GFR calc non Af Amer 90 (*) >90 (mL/min)   GFR calc Af Amer >90  >90 (mL/min)  DIFFERENTIAL      Component Value Range   Neutrophils Relative 83 (*) 43 - 77 (%)   Neutro Abs 11.0 (*) 1.7 - 7.7 (K/uL)   Lymphocytes Relative 8 (*) 12 - 46 (%)   Lymphs Abs 1.1  0.7 - 4.0 (K/uL)   Monocytes  Relative 8  3 - 12 (%)   Monocytes Absolute 1.1 (*) 0.1 - 1.0 (K/uL)   Eosinophils Relative 0  0 - 5 (%)   Eosinophils Absolute 0.0  0.0 - 0.7 (K/uL)   Basophils Relative 0  0 - 1 (%)   Basophils Absolute 0.0  0.0 - 0.1 (K/uL)  TSH      Component Value Range   TSH 0.464  0.350 - 4.500 (uIU/mL)  CULTURE, BLOOD (ROUTINE X 2)      Component Value Range   Specimen Description BLOOD RIGHT HAND     Special Requests BOTTLES DRAWN AEROBIC ONLY 5CC     Setup Time [id number]     Culture NO GROWTH 5 DAYS     Report Status 09/26/2011 FINAL    CULTURE, BLOOD (ROUTINE X 2)      Component Value Range   Specimen Description BLOOD LEFT HAND     Special Requests BOTTLES DRAWN AEROBIC ONLY 5CC     Setup Time [id number]     Culture NO GROWTH 5 DAYS     Report Status 09/26/2011 FINAL    GLUCOSE, CAPILLARY      Component Value Range   Glucose-Capillary 275 (*) 70 - 99 (mg/dL)   Comment 1 Notify RN     Comment  2 Documented in Chart    MRSA PCR SCREENING      Component Value Range   MRSA by PCR NEGATIVE  NEGATIVE   GLUCOSE, CAPILLARY      Component Value Range   Glucose-Capillary 256 (*) 70 - 99 (mg/dL)   Comment 1 Notify RN    PROCALCITONIN      Component Value Range   Procalcitonin <0.10    GLUCOSE, CAPILLARY      Component Value Range   Glucose-Capillary 339 (*) 70 - 99 (mg/dL)   Comment 1 Notify RN    GLUCOSE, CAPILLARY      Component Value Range   Glucose-Capillary 153 (*) 70 - 99 (mg/dL)   Comment 1 Notify RN    CBC      Component Value Range   WBC 19.5 (*) 4.0 - 10.5 (K/uL)   RBC 4.64  4.22 - 5.81 (MIL/uL)   Hemoglobin 14.6  13.0 - 17.0 (g/dL)   HCT 14.7  82.9 - 56.2 (%)   MCV 91.8  78.0 - 100.0 (fL)   MCH 31.5  26.0 - 34.0 (pg)   MCHC 34.3  30.0 - 36.0 (g/dL)   RDW 13.0  86.5 - 78.4 (%)   Platelets 261  150 - 400 (K/uL)  BASIC METABOLIC PANEL      Component Value Range   Sodium 131 (*) 135 - 145 (mEq/L)   Potassium 4.7  3.5 - 5.1 (mEq/L)   Chloride 94 (*) 96 - 112  (mEq/L)   CO2 30  19 - 32 (mEq/L)   Glucose, Bld 187 (*) 70 - 99 (mg/dL)   BUN 22  6 - 23 (mg/dL)   Creatinine, Ser 6.96  0.50 - 1.35 (mg/dL)   Calcium 9.1  8.4 - 29.5 (mg/dL)   GFR calc non Af Amer 88 (*) >90 (mL/min)   GFR calc Af Amer >90  >90 (mL/min)  MAGNESIUM      Component Value Range   Magnesium 1.9  1.5 - 2.5 (mg/dL)  PHOSPHORUS      Component Value Range   Phosphorus 3.2  2.3 - 4.6 (mg/dL)  GLUCOSE, CAPILLARY      Component Value Range   Glucose-Capillary 131 (*) 70 - 99 (mg/dL)   Comment 1 Notify RN    GLUCOSE, CAPILLARY      Component Value Range   Glucose-Capillary 121 (*) 70 - 99 (mg/dL)   Comment 1 Notify RN     Comment 2 Documented in Chart    GLUCOSE, CAPILLARY      Component Value Range   Glucose-Capillary 183 (*) 70 - 99 (mg/dL)   Comment 1 Notify RN     Comment 2 Documented in Chart    LEGIONELLA ANTIGEN, URINE      Component Value Range   Specimen Description URINE, CLEAN CATCH     Special Requests NONE     Legionella Antigen, Urine Negative for Legionella pneumophilia serogroup 1     Report Status 09/22/2011 FINAL    GLUCOSE, CAPILLARY      Component Value Range   Glucose-Capillary 225 (*) 70 - 99 (mg/dL)   Comment 1 Documented in Chart     Comment 2 Notify RN    STREP PNEUMONIAE URINARY ANTIGEN      Component Value Range   Strep Pneumo Urinary Antigen NEGATIVE  NEGATIVE   GLUCOSE, CAPILLARY      Component Value Range   Glucose-Capillary 311 (*) 70 - 99 (mg/dL)   Comment 1 Documented  in Chart     Comment 2 Notify RN    GLUCOSE, CAPILLARY      Component Value Range   Glucose-Capillary 166 (*) 70 - 99 (mg/dL)   Comment 1 Documented in Chart     Comment 2 Notify RN    CBC      Component Value Range   WBC 21.8 (*) 4.0 - 10.5 (K/uL)   RBC 4.94  4.22 - 5.81 (MIL/uL)   Hemoglobin 15.4  13.0 - 17.0 (g/dL)   HCT 16.1  09.6 - 04.5 (%)   MCV 93.1  78.0 - 100.0 (fL)   MCH 31.2  26.0 - 34.0 (pg)   MCHC 33.5  30.0 - 36.0 (g/dL)   RDW 40.9  81.1 -  91.4 (%)   Platelets 291  150 - 400 (K/uL)  BASIC METABOLIC PANEL      Component Value Range   Sodium 134 (*) 135 - 145 (mEq/L)   Potassium 4.9  3.5 - 5.1 (mEq/L)   Chloride 95 (*) 96 - 112 (mEq/L)   CO2 33 (*) 19 - 32 (mEq/L)   Glucose, Bld 183 (*) 70 - 99 (mg/dL)   BUN 23  6 - 23 (mg/dL)   Creatinine, Ser 7.82  0.50 - 1.35 (mg/dL)   Calcium 9.1  8.4 - 95.6 (mg/dL)   GFR calc non Af Amer 85 (*) >90 (mL/min)   GFR calc Af Amer >90  >90 (mL/min)  MAGNESIUM      Component Value Range   Magnesium 2.1  1.5 - 2.5 (mg/dL)  PHOSPHORUS      Component Value Range   Phosphorus 3.1  2.3 - 4.6 (mg/dL)  GLUCOSE, CAPILLARY      Component Value Range   Glucose-Capillary 244 (*) 70 - 99 (mg/dL)   Comment 1 Notify RN     Comment 2 Documented in Chart    GLUCOSE, CAPILLARY      Component Value Range   Glucose-Capillary 220 (*) 70 - 99 (mg/dL)   Comment 1 Notify RN     Comment 2 Documented in Chart    GLUCOSE, CAPILLARY      Component Value Range   Glucose-Capillary 172 (*) 70 - 99 (mg/dL)  GLUCOSE, CAPILLARY      Component Value Range   Glucose-Capillary 209 (*) 70 - 99 (mg/dL)  GLUCOSE, CAPILLARY      Component Value Range   Glucose-Capillary 284 (*) 70 - 99 (mg/dL)   Comment 1 Documented in Chart     Comment 2 Notify RN    GLUCOSE, CAPILLARY      Component Value Range   Glucose-Capillary 255 (*) 70 - 99 (mg/dL)  CBC      Component Value Range   WBC 20.1 (*) 4.0 - 10.5 (K/uL)   RBC 4.92  4.22 - 5.81 (MIL/uL)   Hemoglobin 15.4  13.0 - 17.0 (g/dL)   HCT 21.3  08.6 - 57.8 (%)   MCV 93.3  78.0 - 100.0 (fL)   MCH 31.3  26.0 - 34.0 (pg)   MCHC 33.6  30.0 - 36.0 (g/dL)   RDW 46.9  62.9 - 52.8 (%)   Platelets 301  150 - 400 (K/uL)  BASIC METABOLIC PANEL      Component Value Range   Sodium 133 (*) 135 - 145 (mEq/L)   Potassium 5.1  3.5 - 5.1 (mEq/L)   Chloride 95 (*) 96 - 112 (mEq/L)   CO2 33 (*) 19 - 32 (mEq/L)   Glucose, Bld  145 (*) 70 - 99 (mg/dL)   BUN 22  6 - 23 (mg/dL)    Creatinine, Ser 1.61  0.50 - 1.35 (mg/dL)   Calcium 9.3  8.4 - 09.6 (mg/dL)   GFR calc non Af Amer >90  >90 (mL/min)   GFR calc Af Amer >90  >90 (mL/min)  GLUCOSE, CAPILLARY      Component Value Range   Glucose-Capillary 270 (*) 70 - 99 (mg/dL)   Comment 1 Notify RN     Comment 2 Documented in Chart    GLUCOSE, CAPILLARY      Component Value Range   Glucose-Capillary 106 (*) 70 - 99 (mg/dL)  GLUCOSE, CAPILLARY      Component Value Range   Glucose-Capillary 157 (*) 70 - 99 (mg/dL)   Comment 1 Documented in Chart     Comment 2 Notify RN    GLUCOSE, CAPILLARY      Component Value Range   Glucose-Capillary 96  70 - 99 (mg/dL)   Comment 1 Documented in Chart     Comment 2 Notify RN    GLUCOSE, CAPILLARY      Component Value Range   Glucose-Capillary 147 (*) 70 - 99 (mg/dL)   Comment 1 Documented in Chart     Comment 2 Notify RN    GLUCOSE, CAPILLARY      Component Value Range   Glucose-Capillary 241 (*) 70 - 99 (mg/dL)   Comment 1 Documented in Chart     Comment 2 Notify RN    GLUCOSE, CAPILLARY      Component Value Range   Glucose-Capillary 191 (*) 70 - 99 (mg/dL)   Comment 1 Notify RN     Comment 2 Documented in Chart    GLUCOSE, CAPILLARY      Component Value Range   Glucose-Capillary 75  70 - 99 (mg/dL)   Comment 1 Documented in Chart     Comment 2 Notify RN    GLUCOSE, CAPILLARY      Component Value Range   Glucose-Capillary 74  70 - 99 (mg/dL)   Comment 1 Documented in Chart     Comment 2 Notify RN    GLUCOSE, CAPILLARY      Component Value Range   Glucose-Capillary 78  70 - 99 (mg/dL)  GLUCOSE, CAPILLARY      Component Value Range   Glucose-Capillary 146 (*) 70 - 99 (mg/dL)  GLUCOSE, CAPILLARY      Component Value Range   Glucose-Capillary 205 (*) 70 - 99 (mg/dL)  GLUCOSE, CAPILLARY      Component Value Range   Glucose-Capillary 219 (*) 70 - 99 (mg/dL)   Comment 1 Documented in Chart     Comment 2 Notify RN    GLUCOSE, CAPILLARY      Component Value  Range   Glucose-Capillary 236 (*) 70 - 99 (mg/dL)   Comment 1 Documented in Chart     Comment 2 Notify RN    GLUCOSE, CAPILLARY      Component Value Range   Glucose-Capillary 85  70 - 99 (mg/dL)   Comment 1 Documented in Chart     Comment 2 Notify RN    GLUCOSE, CAPILLARY      Component Value Range   Glucose-Capillary 96  70 - 99 (mg/dL)  GLUCOSE, CAPILLARY      Component Value Range   Glucose-Capillary 159 (*) 70 - 99 (mg/dL)  LEGIONELLA ANTIGEN, URINE      Component Value Range   Specimen Description  URINE, RANDOM     Special Requests NONE     Legionella Antigen, Urine Negative for Legionella pneumophilia serogroup 1     Report Status 09/26/2011 FINAL    GLUCOSE, CAPILLARY      Component Value Range   Glucose-Capillary 252 (*) 70 - 99 (mg/dL)  GLUCOSE, CAPILLARY      Component Value Range   Glucose-Capillary 106 (*) 70 - 99 (mg/dL)   Comment 1 Documented in Chart     Comment 2 Notify RN    GLUCOSE, CAPILLARY      Component Value Range   Glucose-Capillary 296 (*) 70 - 99 (mg/dL)   Comment 1 Documented in Chart     Comment 2 Notify RN    GLUCOSE, CAPILLARY      Component Value Range   Glucose-Capillary 202 (*) 70 - 99 (mg/dL)   Comment 1 Documented in Chart     Comment 2 Notify RN    GLUCOSE, CAPILLARY      Component Value Range   Glucose-Capillary 118 (*) 70 - 99 (mg/dL)  GLUCOSE, CAPILLARY      Component Value Range   Glucose-Capillary 149 (*) 70 - 99 (mg/dL)  GLUCOSE, CAPILLARY      Component Value Range   Glucose-Capillary 236 (*) 70 - 99 (mg/dL)  GLUCOSE, CAPILLARY      Component Value Range   Glucose-Capillary 138 (*) 70 - 99 (mg/dL)  GLUCOSE, CAPILLARY      Component Value Range   Glucose-Capillary 154 (*) 70 - 99 (mg/dL)  GLUCOSE, CAPILLARY      Component Value Range   Glucose-Capillary 147 (*) 70 - 99 (mg/dL)  GLUCOSE, CAPILLARY      Component Value Range   Glucose-Capillary 211 (*) 70 - 99 (mg/dL)  GLUCOSE, CAPILLARY      Component Value Range    Glucose-Capillary 206 (*) 70 - 99 (mg/dL)   Comment 1 Documented in Chart     Comment 2 Notify RN    GLUCOSE, CAPILLARY      Component Value Range   Glucose-Capillary 83  70 - 99 (mg/dL)   Comment 1 Documented in Chart     Comment 2 Notify RN    GLUCOSE, CAPILLARY      Component Value Range   Glucose-Capillary 327 (*) 70 - 99 (mg/dL)   Comment 1 Notify RN     Comment 2 Documented in Chart    GLUCOSE, CAPILLARY      Component Value Range   Glucose-Capillary 201 (*) 70 - 99 (mg/dL)   Comment 1 Notify RN     Comment 2 Documented in Chart    GLUCOSE, CAPILLARY      Component Value Range   Glucose-Capillary 220 (*) 70 - 99 (mg/dL)  GLUCOSE, CAPILLARY      Component Value Range   Glucose-Capillary 84  70 - 99 (mg/dL)  GLUCOSE, CAPILLARY      Component Value Range   Glucose-Capillary 154 (*) 70 - 99 (mg/dL)   Comment 1 Notify RN     Comment 2 Documented in Chart    GLUCOSE, CAPILLARY      Component Value Range   Glucose-Capillary 237 (*) 70 - 99 (mg/dL)  PRO B NATRIURETIC PEPTIDE      Component Value Range   Pro B Natriuretic peptide (BNP) 126.8  0 - 450 (pg/mL)  BASIC METABOLIC PANEL      Component Value Range   Sodium 130 (*) 135 - 145 (mEq/L)   Potassium 4.8  3.5 - 5.1 (mEq/L)   Chloride 94 (*) 96 - 112 (mEq/L)   CO2 29  19 - 32 (mEq/L)   Glucose, Bld 71  70 - 99 (mg/dL)   BUN 23  6 - 23 (mg/dL)   Creatinine, Ser 1.61  0.50 - 1.35 (mg/dL)   Calcium 8.8  8.4 - 09.6 (mg/dL)   GFR calc non Af Amer 90 (*) >90 (mL/min)   GFR calc Af Amer >90  >90 (mL/min)  CBC      Component Value Range   WBC 14.2 (*) 4.0 - 10.5 (K/uL)   RBC 3.66 (*) 4.22 - 5.81 (MIL/uL)   Hemoglobin 11.8 (*) 13.0 - 17.0 (g/dL)   HCT 04.5 (*) 40.9 - 52.0 (%)   MCV 92.1  78.0 - 100.0 (fL)   MCH 32.2  26.0 - 34.0 (pg)   MCHC 35.0  30.0 - 36.0 (g/dL)   RDW 81.1  91.4 - 78.2 (%)   Platelets 266  150 - 400 (K/uL)  DIFFERENTIAL      Component Value Range   Neutrophils Relative 72  43 - 77 (%)   Neutro  Abs 10.2 (*) 1.7 - 7.7 (K/uL)   Lymphocytes Relative 14  12 - 46 (%)   Lymphs Abs 2.0  0.7 - 4.0 (K/uL)   Monocytes Relative 12  3 - 12 (%)   Monocytes Absolute 1.8 (*) 0.1 - 1.0 (K/uL)   Eosinophils Relative 1  0 - 5 (%)   Eosinophils Absolute 0.1  0.0 - 0.7 (K/uL)   Basophils Relative 0  0 - 1 (%)   Basophils Absolute 0.0  0.0 - 0.1 (K/uL)  GLUCOSE, CAPILLARY      Component Value Range   Glucose-Capillary 113 (*) 70 - 99 (mg/dL)  GLUCOSE, CAPILLARY      Component Value Range   Glucose-Capillary 84  70 - 99 (mg/dL)  GLUCOSE, CAPILLARY      Component Value Range   Glucose-Capillary 187 (*) 70 - 99 (mg/dL)   Comment 1 Notify RN     Comment 2 Documented in Chart       Signed: Rayan Ines NEVILL 10/01/2011, 6:59 AM

## 2011-10-01 NOTE — Discharge Summary (Signed)
SL removed from L FA, no bleeding noted, catheter tip intact.  Pt given d/c instructions; wife, daughter and son at bedside.  Packet with script for xanax given to EMS workers.  Pt assisted to stretcher with assist x 2 and transported to SNF for rehab.

## 2011-10-03 ENCOUNTER — Encounter (HOSPITAL_COMMUNITY): Payer: Self-pay | Admitting: *Deleted

## 2011-10-03 ENCOUNTER — Emergency Department (HOSPITAL_COMMUNITY): Payer: Medicare Other

## 2011-10-03 ENCOUNTER — Other Ambulatory Visit: Payer: Self-pay

## 2011-10-03 ENCOUNTER — Inpatient Hospital Stay (HOSPITAL_COMMUNITY)
Admission: EM | Admit: 2011-10-03 | Discharge: 2011-10-08 | DRG: 157 | Disposition: A | Discharge status: Skilled Nursing Facility | Payer: Medicare Other | Attending: Internal Medicine | Admitting: Internal Medicine

## 2011-10-03 DIAGNOSIS — I2510 Atherosclerotic heart disease of native coronary artery without angina pectoris: Secondary | ICD-10-CM | POA: Diagnosis present

## 2011-10-03 DIAGNOSIS — J9620 Acute and chronic respiratory failure, unspecified whether with hypoxia or hypercapnia: Secondary | ICD-10-CM | POA: Diagnosis present

## 2011-10-03 DIAGNOSIS — I252 Old myocardial infarction: Secondary | ICD-10-CM

## 2011-10-03 DIAGNOSIS — Z794 Long term (current) use of insulin: Secondary | ICD-10-CM

## 2011-10-03 DIAGNOSIS — I739 Peripheral vascular disease, unspecified: Secondary | ICD-10-CM | POA: Diagnosis present

## 2011-10-03 DIAGNOSIS — J4489 Other specified chronic obstructive pulmonary disease: Secondary | ICD-10-CM | POA: Diagnosis present

## 2011-10-03 DIAGNOSIS — D62 Acute posthemorrhagic anemia: Secondary | ICD-10-CM | POA: Diagnosis present

## 2011-10-03 DIAGNOSIS — M6281 Muscle weakness (generalized): Secondary | ICD-10-CM | POA: Diagnosis not present

## 2011-10-03 DIAGNOSIS — Z5189 Encounter for other specified aftercare: Secondary | ICD-10-CM | POA: Diagnosis not present

## 2011-10-03 DIAGNOSIS — J189 Pneumonia, unspecified organism: Secondary | ICD-10-CM | POA: Diagnosis present

## 2011-10-03 DIAGNOSIS — R5381 Other malaise: Secondary | ICD-10-CM | POA: Diagnosis not present

## 2011-10-03 DIAGNOSIS — B370 Candidal stomatitis: Secondary | ICD-10-CM | POA: Diagnosis not present

## 2011-10-03 DIAGNOSIS — E0500 Thyrotoxicosis with diffuse goiter without thyrotoxic crisis or storm: Secondary | ICD-10-CM | POA: Diagnosis present

## 2011-10-03 DIAGNOSIS — E119 Type 2 diabetes mellitus without complications: Secondary | ICD-10-CM | POA: Diagnosis present

## 2011-10-03 DIAGNOSIS — Z7982 Long term (current) use of aspirin: Secondary | ICD-10-CM

## 2011-10-03 DIAGNOSIS — J9610 Chronic respiratory failure, unspecified whether with hypoxia or hypercapnia: Secondary | ICD-10-CM

## 2011-10-03 DIAGNOSIS — R1310 Dysphagia, unspecified: Secondary | ICD-10-CM | POA: Diagnosis present

## 2011-10-03 DIAGNOSIS — J9600 Acute respiratory failure, unspecified whether with hypoxia or hypercapnia: Secondary | ICD-10-CM | POA: Diagnosis not present

## 2011-10-03 DIAGNOSIS — I959 Hypotension, unspecified: Secondary | ICD-10-CM | POA: Diagnosis not present

## 2011-10-03 DIAGNOSIS — E860 Dehydration: Secondary | ICD-10-CM | POA: Diagnosis not present

## 2011-10-03 DIAGNOSIS — J449 Chronic obstructive pulmonary disease, unspecified: Secondary | ICD-10-CM | POA: Diagnosis present

## 2011-10-03 LAB — CBC
HCT: 28.7 % — ABNORMAL LOW (ref 39.0–52.0)
Hemoglobin: 10.3 g/dL — ABNORMAL LOW (ref 13.0–17.0)
MCH: 32.5 pg (ref 26.0–34.0)
MCHC: 35.9 g/dL (ref 30.0–36.0)
MCV: 90.5 fL (ref 78.0–100.0)
Platelets: 218 K/uL (ref 150–400)
RBC: 3.17 MIL/uL — ABNORMAL LOW (ref 4.22–5.81)
RDW: 13.9 % (ref 11.5–15.5)
WBC: 12.4 K/uL — ABNORMAL HIGH (ref 4.0–10.5)

## 2011-10-03 LAB — DIFFERENTIAL
Basophils Absolute: 0 K/uL (ref 0.0–0.1)
Basophils Relative: 0 % (ref 0–1)
Eosinophils Absolute: 0.1 K/uL (ref 0.0–0.7)
Eosinophils Relative: 1 % (ref 0–5)
Lymphocytes Relative: 13 % (ref 12–46)
Lymphs Abs: 1.6 K/uL (ref 0.7–4.0)
Monocytes Absolute: 1.1 K/uL — ABNORMAL HIGH (ref 0.1–1.0)
Monocytes Relative: 9 % (ref 3–12)
Neutro Abs: 9.6 K/uL — ABNORMAL HIGH (ref 1.7–7.7)
Neutrophils Relative %: 77 % (ref 43–77)

## 2011-10-03 LAB — COMPREHENSIVE METABOLIC PANEL WITH GFR
ALT: 15 U/L (ref 0–53)
AST: 12 U/L (ref 0–37)
Albumin: 2.4 g/dL — ABNORMAL LOW (ref 3.5–5.2)
Alkaline Phosphatase: 44 U/L (ref 39–117)
BUN: 42 mg/dL — ABNORMAL HIGH (ref 6–23)
CO2: 22 meq/L (ref 19–32)
Calcium: 8.7 mg/dL (ref 8.4–10.5)
Chloride: 93 meq/L — ABNORMAL LOW (ref 96–112)
Creatinine, Ser: 0.99 mg/dL (ref 0.50–1.35)
GFR calc Af Amer: 88 mL/min — ABNORMAL LOW
GFR calc non Af Amer: 76 mL/min — ABNORMAL LOW
Glucose, Bld: 165 mg/dL — ABNORMAL HIGH (ref 70–99)
Potassium: 4.6 meq/L (ref 3.5–5.1)
Sodium: 125 meq/L — ABNORMAL LOW (ref 135–145)
Total Bilirubin: 0.2 mg/dL — ABNORMAL LOW (ref 0.3–1.2)
Total Protein: 4.7 g/dL — ABNORMAL LOW (ref 6.0–8.3)

## 2011-10-03 LAB — CARDIAC PANEL(CRET KIN+CKTOT+MB+TROPI)
CK, MB: 2.1 ng/mL (ref 0.3–4.0)
Total CK: 16 U/L (ref 7–232)
Troponin I: <0.3 ng/mL (ref <0.30)

## 2011-10-03 LAB — GLUCOSE, CAPILLARY

## 2011-10-03 MED ORDER — EZETIMIBE 10 MG PO TABS
10.0000 mg | ORAL_TABLET | Freq: Every day | ORAL | Status: DC
  Administered 2011-10-03 – 2011-10-08 (×6): 10 mg via ORAL
  Filled 2011-10-03 (×6): qty 1

## 2011-10-03 MED ORDER — DOCUSATE SODIUM 100 MG PO CAPS
100.0000 mg | ORAL_CAPSULE | Freq: Two times a day (BID) | ORAL | Status: DC
  Administered 2011-10-03 – 2011-10-08 (×10): 100 mg via ORAL
  Filled 2011-10-03 (×11): qty 1

## 2011-10-03 MED ORDER — INSULIN GLARGINE 100 UNIT/ML ~~LOC~~ SOLN
10.0000 [IU] | Freq: Every day | SUBCUTANEOUS | Status: DC
  Administered 2011-10-03 – 2011-10-07 (×5): 10 [IU] via SUBCUTANEOUS
  Filled 2011-10-03: qty 3

## 2011-10-03 MED ORDER — ALBUTEROL SULFATE (5 MG/ML) 0.5% IN NEBU
2.5000 mg | INHALATION_SOLUTION | RESPIRATORY_TRACT | Status: DC | PRN
  Administered 2011-10-03: 2.5 mg via RESPIRATORY_TRACT

## 2011-10-03 MED ORDER — ONDANSETRON HCL 4 MG PO TABS: 4.0000 mg | ORAL_TABLET | Freq: Four times a day (QID) | ORAL | Status: DC | PRN

## 2011-10-03 MED ORDER — DORZOLAMIDE HCL 2 % OP SOLN
1.0000 [drp] | Freq: Every day | OPHTHALMIC | Status: DC
  Administered 2011-10-04: 1 [drp] via OPHTHALMIC
  Filled 2011-10-03: qty 10

## 2011-10-03 MED ORDER — ASPIRIN EC 81 MG PO TBEC
81.0000 mg | DELAYED_RELEASE_TABLET | Freq: Every day | ORAL | Status: DC
  Administered 2011-10-03 – 2011-10-08 (×6): 81 mg via ORAL
  Filled 2011-10-03 (×6): qty 1

## 2011-10-03 MED ORDER — VANCOMYCIN HCL 500 MG IV SOLR
500.0000 mg | Freq: Two times a day (BID) | INTRAVENOUS | Status: DC
  Filled 2011-10-03 (×2): qty 500

## 2011-10-03 MED ORDER — IPRATROPIUM BROMIDE 0.02 % IN SOLN
0.5000 mg | Freq: Three times a day (TID) | RESPIRATORY_TRACT | Status: DC
  Administered 2011-10-03 – 2011-10-08 (×15): 0.5 mg via RESPIRATORY_TRACT
  Filled 2011-10-03 (×15): qty 2.5

## 2011-10-03 MED ORDER — FLORA-Q PO CAPS
1.0000 | ORAL_CAPSULE | Freq: Every day | ORAL | Status: DC
  Administered 2011-10-03 – 2011-10-08 (×6): 1 via ORAL
  Filled 2011-10-03 (×6): qty 1

## 2011-10-03 MED ORDER — ENOXAPARIN SODIUM 40 MG/0.4ML ~~LOC~~ SOLN
40.0000 mg | SUBCUTANEOUS | Status: DC
Start: 2011-10-03 — End: 2011-10-08
  Administered 2011-10-03 – 2011-10-07 (×5): 40 mg via SUBCUTANEOUS
  Filled 2011-10-03 (×6): qty 0.4

## 2011-10-03 MED ORDER — DEXTROSE 5 % IV SOLN
500.0000 mg | INTRAVENOUS | Status: DC
  Administered 2011-10-04: 500 mg via INTRAVENOUS
  Filled 2011-10-03 (×2): qty 500

## 2011-10-03 MED ORDER — PIPERACILLIN-TAZOBACTAM 3.375 G IVPB: 3.3750 g | Freq: Three times a day (TID) | INTRAVENOUS | Status: DC

## 2011-10-03 MED ORDER — SODIUM CHLORIDE 0.9 % IJ SOLN
INTRAMUSCULAR | Status: AC
  Filled 2011-10-03: qty 20

## 2011-10-03 MED ORDER — TRAVOPROST 0.004 % OP SOLN: 1.0000 [drp] | Freq: Every day | OPHTHALMIC | Status: DC

## 2011-10-03 MED ORDER — PIPERACILLIN-TAZOBACTAM 3.375 G IVPB
3.3750 g | Freq: Once | INTRAVENOUS | Status: AC
  Administered 2011-10-03: 3.375 g via INTRAVENOUS
  Filled 2011-10-03: qty 50

## 2011-10-03 MED ORDER — METFORMIN HCL 500 MG PO TABS
500.0000 mg | ORAL_TABLET | Freq: Two times a day (BID) | ORAL | Status: DC
  Administered 2011-10-04 – 2011-10-08 (×8): 500 mg via ORAL
  Filled 2011-10-03 (×11): qty 1

## 2011-10-03 MED ORDER — PREDNISONE 20 MG PO TABS
20.0000 mg | ORAL_TABLET | Freq: Every day | ORAL | Status: DC
  Administered 2011-10-04 – 2011-10-07 (×4): 20 mg via ORAL
  Filled 2011-10-03 (×5): qty 1

## 2011-10-03 MED ORDER — BRINZOLAMIDE 1 % OP SUSP
1.0000 [drp] | OPHTHALMIC | Status: DC
  Filled 2011-10-03: qty 10

## 2011-10-03 MED ORDER — FLUTICASONE PROPIONATE HFA 44 MCG/ACT IN AERO
2.0000 | INHALATION_SPRAY | Freq: Two times a day (BID) | RESPIRATORY_TRACT | Status: DC
  Administered 2011-10-03 – 2011-10-08 (×10): 2 via RESPIRATORY_TRACT
  Filled 2011-10-03: qty 10.6

## 2011-10-03 MED ORDER — VANCOMYCIN HCL IN DEXTROSE 1-5 GM/200ML-% IV SOLN: 1000.0000 mg | Freq: Two times a day (BID) | INTRAVENOUS | Status: DC

## 2011-10-03 MED ORDER — ONDANSETRON HCL 4 MG/2ML IJ SOLN: 4.0000 mg | Freq: Four times a day (QID) | INTRAMUSCULAR | Status: DC | PRN

## 2011-10-03 MED ORDER — VANCOMYCIN HCL 1000 MG IV SOLR
750.0000 mg | Freq: Once | INTRAVENOUS | Status: AC
  Administered 2011-10-03: 750 mg via INTRAVENOUS
  Filled 2011-10-03: qty 750

## 2011-10-03 MED ORDER — FLUCONAZOLE 100MG IVPB
100.0000 mg | INTRAVENOUS | Status: DC
  Filled 2011-10-03: qty 50

## 2011-10-03 MED ORDER — NYSTATIN 100000 UNIT/ML MT SUSP
5.0000 mL | Freq: Four times a day (QID) | OROMUCOSAL | Status: DC
  Administered 2011-10-03 – 2011-10-08 (×18): 500000 [IU] via ORAL
  Filled 2011-10-03 (×21): qty 5

## 2011-10-03 MED ORDER — INSULIN ASPART 100 UNIT/ML ~~LOC~~ SOLN
0.0000 [IU] | Freq: Three times a day (TID) | SUBCUTANEOUS | Status: DC
  Administered 2011-10-04: 5 [IU] via SUBCUTANEOUS
  Administered 2011-10-04 – 2011-10-05 (×2): 2 [IU] via SUBCUTANEOUS
  Administered 2011-10-05 – 2011-10-06 (×2): 3 [IU] via SUBCUTANEOUS
  Administered 2011-10-06: 2 [IU] via SUBCUTANEOUS
  Administered 2011-10-07: 1 [IU] via SUBCUTANEOUS
  Administered 2011-10-07: 2 [IU] via SUBCUTANEOUS
  Administered 2011-10-08: 1 [IU] via SUBCUTANEOUS
  Filled 2011-10-03: qty 3

## 2011-10-03 MED ORDER — TRAVOPROST (BAK FREE) 0.004 % OP SOLN
1.0000 [drp] | Freq: Every day | OPHTHALMIC | Status: DC
  Administered 2011-10-03 – 2011-10-07 (×5): 1 [drp] via OPHTHALMIC
  Filled 2011-10-03 (×2): qty 2.5

## 2011-10-03 MED ORDER — PIPERACILLIN-TAZOBACTAM 3.375 G IVPB
3.3750 g | Freq: Three times a day (TID) | INTRAVENOUS | Status: DC
  Administered 2011-10-04: 3.375 g via INTRAVENOUS
  Filled 2011-10-03 (×3): qty 50

## 2011-10-03 MED ORDER — SODIUM CHLORIDE 0.9 % IV SOLN
Freq: Once | INTRAVENOUS | Status: AC
  Administered 2011-10-03: 500 mL via INTRAVENOUS

## 2011-10-03 MED ORDER — ACETAMINOPHEN 650 MG RE SUPP: 650.0000 mg | Freq: Four times a day (QID) | RECTAL | Status: DC | PRN

## 2011-10-03 MED ORDER — ALPRAZOLAM 0.25 MG PO TABS: 0.2500 mg | ORAL_TABLET | Freq: Three times a day (TID) | ORAL | Status: DC | PRN

## 2011-10-03 MED ORDER — ALBUTEROL SULFATE (5 MG/ML) 0.5% IN NEBU
2.5000 mg | INHALATION_SOLUTION | Freq: Four times a day (QID) | RESPIRATORY_TRACT | Status: DC
  Administered 2011-10-04 – 2011-10-08 (×17): 2.5 mg via RESPIRATORY_TRACT
  Filled 2011-10-03 (×18): qty 0.5

## 2011-10-03 MED ORDER — SODIUM CHLORIDE 0.9 % IV SOLN
INTRAVENOUS | Status: AC
  Administered 2011-10-04 – 2011-10-07 (×4): via INTRAVENOUS

## 2011-10-03 MED ORDER — FLUCONAZOLE IN SODIUM CHLORIDE 200-0.9 MG/100ML-% IV SOLN
200.0000 mg | Freq: Once | INTRAVENOUS | Status: AC
  Administered 2011-10-04: 200 mg via INTRAVENOUS
  Filled 2011-10-03: qty 100

## 2011-10-03 MED ORDER — ALUM & MAG HYDROXIDE-SIMETH 200-200-20 MG/5ML PO SUSP
30.0000 mL | Freq: Four times a day (QID) | ORAL | Status: DC | PRN
  Administered 2011-10-04: 30 mL via ORAL
  Filled 2011-10-03: qty 30

## 2011-10-03 MED ORDER — ACETAMINOPHEN 325 MG PO TABS: 650.0000 mg | ORAL_TABLET | Freq: Four times a day (QID) | ORAL | Status: DC | PRN

## 2011-10-03 NOTE — ED Notes (Signed)
MD aware of low blood pressure. Bolus additional 500 cc ns.

## 2011-10-03 NOTE — ED Notes (Signed)
Patient is resting comfortably. 

## 2011-10-03 NOTE — ED Provider Notes (Signed)
History     CSN: [id number]  Arrival date & time 10/03/11  1223   First MD Initiated Contact with Patient 10/03/11 1224      Chief Complaint  Patient presents with  . Hypotension    (Consider location/radiation/quality/duration/timing/severity/associated sxs/prior treatment) HPI Comments: Patient recently admitted for COPD exacerbation, pneumonia.  Was in hospital for two weeks and discharged two days ago to [facility].  Was sent here today for eval of low blood pressure.  They checked his BP and it was 70/40.  The patient is without complaint.  There is no chest pain, dizziness, shortness of breath or other complaints.  He tells me he feels fine.  Denies fever.  He does tell me his blood pressure normally runs low, systolic of 110's.  No aggravating or alleviating factors.    The history is provided by the patient.    Past Medical History  Diagnosis Date  . Diabetes mellitus   . Hyperlipidemia   . Myocardial infarction   . COPD (chronic obstructive pulmonary disease)   . Emphysema of lung   . Abdominal aneurysm without mention of rupture   . Thyroid disease     Hyperthyroidism, Goiter  . Hypotension     Past Surgical History  Procedure Date  . Hernia repair     LEFT INGUINAL  . Abdominal aorta stent     ENDOSTENT REPAIR 10/15/2010  . Prostate surgery   . Abdominal aortic aneurysm repair 05/26/11    PEVAR    No family history on file.  History  Substance Use Topics  . Smoking status: Former Smoker -- 1.5 packs/day    Quit date: 10/04/2004  . Smokeless tobacco: Never Used  . Alcohol Use: No      Review of Systems  All other systems reviewed and are negative.    Allergies  Statins  Home Medications   Current Outpatient Rx  Name Route Sig Dispense Refill  . ACETAMINOPHEN 325 MG PO TABS Oral Take 2 tablets (650 mg total) by mouth every 6 (six) hours as needed (or Fever >/= 101). 30 tablet 0  . ALBUTEROL SULFATE (5 MG/ML) 0.5% IN NEBU Nebulization Take 0.5 mLs  (2.5 mg total) by nebulization 3 (three) times daily. 0.5 mL 0  . ALPRAZOLAM 0.25 MG PO TABS Oral Take 1 tablet (0.25 mg total) by mouth 3 (three) times daily as needed for anxiety. 30 tablet 0  . ASPIRIN 81 MG PO TBEC Oral Take 1 tablet (81 mg total) by mouth daily. 1 tablet 0  . BRINZOLAMIDE 1 % OP SUSP Left Eye Place 1 drop into the left eye every morning.      . BUDESONIDE 180 MCG/ACT IN AEPB Inhalation Inhale 1 puff into the lungs 2 (two) times daily.      . DORZOLAMIDE HCL 2 % OP SOLN Left Eye Place 1 drop into the left eye daily before breakfast. 10 mL 0  . EZETIMIBE 10 MG PO TABS Oral Take 10 mg by mouth daily.     . INSULIN ASPART 100 UNIT/ML [place] SOLN Subcutaneous Inject 0-15 Units into the skin 3 (three) times daily with meals. 1 vial 0  . INSULIN GLARGINE 100 UNIT/ML [place] SOLN Subcutaneous Inject 10 Units into the skin at bedtime. 10 mL 0  . IPRATROPIUM BROMIDE 0.02 % IN SOLN Nebulization Take 2.5 mLs (0.5 mg total) by nebulization 3 (three) times daily. 75 mL 0  . LISINOPRIL 20 MG PO TABS Oral Take 0.5 tablets (10 mg total) by  mouth daily. 30 tablet 0  . METFORMIN HCL 500 MG PO TABS Oral Take 500 mg by mouth 2 (two) times daily with a meal.      . NYSTATIN 100000 UNIT/ML MT SUSP Oral Take 5 mLs (500,000 Units total) by mouth 4 (four) times daily. 60 mL 0  . PREDNISONE 20 MG PO TABS Oral Take 1 tablet (20 mg total) by mouth daily with breakfast. 30 tablet 0    20 mg orally daily for 3 days then 10 mg orally fo ...  . TAMSULOSIN HCL 0.4 MG PO CAPS Oral Take 0.4 mg by mouth daily.      . TRAVOPROST 0.004 % OP SOLN Both Eyes Place 1 drop into both eyes at bedtime.       There were no vitals taken for this visit.  Physical Exam  Nursing note and vitals reviewed. Constitutional: He is oriented to person, place, and time. He appears well-developed and well-nourished. No distress.  HENT:  Head: Normocephalic and atraumatic.  Right Ear: External ear normal.  Mouth/Throat: Oropharynx is  clear and moist.  Eyes: EOM are normal. Pupils are equal, round, and reactive to light.  Neck: Normal range of motion. Neck supple.  Cardiovascular: Normal rate and regular rhythm.   No murmur heard. Pulmonary/Chest: Effort normal and breath sounds normal. No respiratory distress. He has no wheezes.  Abdominal: Soft. Bowel sounds are normal. He exhibits no distension. There is no tenderness.  Musculoskeletal: Normal range of motion. He exhibits no edema.  Neurological: He is alert and oriented to person, place, and time. No cranial nerve deficit.  Skin: Skin is warm and dry. He is not diaphoretic.    ED Course  Procedures (including critical care time)   Labs Reviewed  CBC  DIFFERENTIAL  COMPREHENSIVE METABOLIC PANEL  CARDIAC PANEL(CRET KIN+CKTOT+MB+TROPI)   No results found.   No diagnosis found.    MDM  Patient is hypotensive and I am unsure as to the exact cause.  Due to a slight bump in BUN and Cr, I suspect it may be related to dehydration.  He tells me he has been unable to eat much due to pain in the mouth and throat related to thrush.  I will consult internal medicine for evaluation.  I have spoken with them and they come and see the patient.          Geoffery Lyons, MD 10/06/11 [phone]

## 2011-10-03 NOTE — ED Notes (Signed)
RRT called re: labile spb 80-92; pt. Asymptomatic; admitting md has yet to write new parameters; pt. To be re-evaluated.

## 2011-10-03 NOTE — ED Notes (Signed)
EMS gave 300 cc ns.  After bolus 76/40.

## 2011-10-03 NOTE — ED Notes (Signed)
Paged md to re-evaluate pt. For sdu admission.

## 2011-10-03 NOTE — Progress Notes (Signed)
ANTIBIOTIC CONSULT NOTE - INITIAL  Pharmacy Consult for Vancomycin & Zosyn Indication: rule out pneumonia  Allergies  Allergen Reactions  . Other Other (See Comments)    Says that antibiotics made his mouth sore   . Statins Other (See Comments)    Body aches   Assessment: 77 yo M admitted from SNF with hypotension, black stools, nausea and vommiting  Goal of Therapy:  Vancomycin trough level 15-20 mcg/ml, renal adjustment of zosyn  Plan:  1. Vancomcyin 750 mg IV x1 now, follow up Scr for further dosing 2. Zosyn 3.375 g IV q8h, infuse over 4h 3. Follow up SCr, UOP, cultures, clinical course and adjust as clinically indicated.  Patient Measurements: Weight: 135 lb (61.236 kg)  Vital Signs: Temp: 97.3 F (36.3 C) (12/30 1245) Temp src: Oral (12/30 1245) BP: 86/46 mmHg (12/30 1645) Pulse Rate: 88  (12/30 1630) Intake/Output from previous day:   Intake/Output from this shift: Total I/O In: -  Out: 525 [Urine:525]  Labs:  [facility] 10/03/11 1246  WBC 12.4*  HGB 10.3*  PLT 218  LABCREA --  CREATININE 0.99   The CrCl is unknown because both a height and weight (above a minimum accepted value) are required for this calculation. No results found for this basename: VANCOTROUGH:2,VANCOPEAK:2,VANCORANDOM:2,GENTTROUGH:2,GENTPEAK:2,GENTRANDOM:2,TOBRATROUGH:2,TOBRAPEAK:2,TOBRARND:2,AMIKACINPEAK:2,AMIKACINTROU:2,AMIKACIN:2, in the last 72 hours   Microbiology: Recent Results (from the past 720 hour(s))  CULTURE, BLOOD (ROUTINE X 2)     Status: Normal   Collection Time   09/19/11  1:38 PM      Component Value Range Status Comment   Specimen Description BLOOD RIGHT ARM   Final    Special Requests BOTTLES DRAWN AEROBIC AND ANAEROBIC 10CC   Final    Setup Time [id number]   Final    Culture NO GROWTH 5 DAYS   Final    Report Status 09/25/2011 FINAL   Final   CULTURE, BLOOD (ROUTINE X 2)     Status: Normal   Collection Time   09/19/11  1:50 PM      Component Value Range  Status Comment   Specimen Description BLOOD LEFT HAND   Final    Special Requests BOTTLES DRAWN AEROBIC ONLY 10CC   Final    Setup Time [id number]   Final    Culture NO GROWTH 5 DAYS   Final    Report Status 09/25/2011 FINAL   Final   MRSA PCR SCREENING     Status: Normal   Collection Time   09/20/11  1:16 AM      Component Value Range Status Comment   MRSA by PCR NEGATIVE  NEGATIVE  Final   CULTURE, BLOOD (ROUTINE X 2)     Status: Normal   Collection Time   09/20/11  4:00 AM      Component Value Range Status Comment   Specimen Description BLOOD RIGHT HAND   Final    Special Requests BOTTLES DRAWN AEROBIC ONLY 5CC   Final    Setup Time [id number]   Final    Culture NO GROWTH 5 DAYS   Final    Report Status 09/26/2011 FINAL   Final   CULTURE, BLOOD (ROUTINE X 2)     Status: Normal   Collection Time   09/20/11  4:10 AM      Component Value Range Status Comment   Specimen Description BLOOD LEFT HAND   Final    Special Requests BOTTLES DRAWN AEROBIC ONLY 5CC   Final    Setup Time [id number]   Final  Culture NO GROWTH 5 DAYS   Final    Report Status 09/26/2011 FINAL   Final     Medical History: Past Medical History  Diagnosis Date  . Diabetes mellitus   . Hyperlipidemia   . Myocardial infarction   . COPD (chronic obstructive pulmonary disease)   . Emphysema of lung   . Abdominal aneurysm without mention of rupture   . Thyroid disease     Hyperthyroidism, Goiter  . Hypotension    Medications:   (Not in a hospital admission)  Imad Shostak Merrily Brittle 10/03/2011,5:08 PM

## 2011-10-03 NOTE — ED Notes (Signed)
md returned call and pt. To be admitted to 3300.

## 2011-10-03 NOTE — H&P (Signed)
Joseph [name] is an 77 y.o. male.   Chief Complaint: Hypotension, black stools, nausea and vomiting HPI: Pt is a 77 yr old male who was just discharged from this facility on 12/28 by Dr. Alroy Dust after a stay for severe COPD exacerbation and acute on chronic respiratory failure.  While here he developed thrush and has struggled with tolerating PO since and was only able to tolerate chicken noodle soup in the last 24 hrs. Pt has been coughing up purulent sputum since d/c, but denies new SOB or cough.  He denies fevers, admits to chills, but denies weakness or dizziness.  He was sent to ED today because of low BP and emesis this am.  Since here the pt has received 1.5L NS, but his BP remains in the 70's over 40's.  His blood pressure runs low on a normal basis, but this generally means less than 115, greater than 100.  Past Medical History  Diagnosis Date  . Diabetes mellitus   . Hyperlipidemia   . Myocardial infarction   . COPD (chronic obstructive pulmonary disease)   . Emphysema of lung   . Abdominal aneurysm without mention of rupture   . Thyroid disease     Hyperthyroidism, Goiter  . Hypotension     Past Surgical History  Procedure Date  . Hernia repair     LEFT INGUINAL  . Abdominal aorta stent     ENDOSTENT REPAIR 10/15/2010  . Prostate surgery   . Abdominal aortic aneurysm repair 05/26/11    PEVAR    No family history on file. Social History:  reports that he quit smoking about 7 years ago. He has never used smokeless tobacco. He reports that he does not drink alcohol or use illicit drugs. Medications Prior to Admission  Medication Dose Route Frequency Provider Last Rate Last Dose  . 0.9 %  sodium chloride infusion   Intravenous Once Geoffery Lyons, MD 500 mL/hr at 10/03/11 1326 500 mL at 10/03/11 1326   Medications Prior to Admission  Medication Sig Dispense Refill  . ALPRAZolam (XANAX) 0.25 MG tablet Take 1 tablet (0.25 mg total) by mouth 3 (three) times daily as needed  for anxiety.  30 tablet  0  . aspirin EC 81 MG EC tablet Take 1 tablet (81 mg total) by mouth daily.  1 tablet  0  . brinzolamide (AZOPT) 1 % ophthalmic suspension Place 1 drop into the left eye every morning.       . budesonide (PULMICORT FLEXHALER) 180 MCG/ACT inhaler Inhale 1 puff into the lungs 2 (two) times daily.        . dorzolamide (TRUSOPT) 2 % ophthalmic solution Place 1 drop into the left eye daily before breakfast.  10 mL  0  . ezetimibe (ZETIA) 10 MG tablet Take 10 mg by mouth daily.       . insulin aspart (NOVOLOG) 100 UNIT/ML injection Inject 0-15 Units into the skin 3 (three) times daily with meals.  1 vial  0  . insulin glargine (LANTUS) 100 UNIT/ML injection Inject 10 Units into the skin at bedtime.  10 mL  0  . ipratropium (ATROVENT) 0.02 % nebulizer solution Take 2.5 mLs (0.5 mg total) by nebulization 3 (three) times daily.  75 mL  0  . lisinopril (PRINIVIL,ZESTRIL) 20 MG tablet Take 0.5 tablets (10 mg total) by mouth daily.  30 tablet  0  . metFORMIN (GLUCOPHAGE) 500 MG tablet Take 500 mg by mouth 2 (two) times daily with a  meal.        . nystatin (MYCOSTATIN) 100000 UNIT/ML suspension Take 5 mLs (500,000 Units total) by mouth 4 (four) times daily.  60 mL  0  . predniSONE (DELTASONE) 20 MG tablet Take 1 tablet (20 mg total) by mouth daily with breakfast.  30 tablet  0  . Tamsulosin HCl (FLOMAX) 0.4 MG CAPS Take 0.4 mg by mouth daily.        . travoprost, benzalkonium, (TRAVATAN) 0.004 % ophthalmic solution Place 1 drop into both eyes at bedtime.         Allergies:  Allergies  Allergen Reactions  . Other Other (See Comments)    Says that antibiotics made his mouth sore   . Statins Other (See Comments)    Body aches    Constitutional: positive for chills and fatigue, negative for fevers and malaise Eyes: negative for irritation, redness and visual disturbance Ears, nose, mouth, throat, and face: positive for sore mouth and sore throat, negative for ear drainage, nasal  congestion and voice change Respiratory: positive for cough, dyspnea on exertion and sputum, negative for hemoptysis, pleurisy/chest pain and wheezing Cardiovascular: positive for dyspnea and fatigue, negative for chest pressure/discomfort, irregular heart beat, lower extremity edema and orthopnea Gastrointestinal: positive for melena, nausea and vomiting, negative for abdominal pain, diarrhea and jaundice Hematologic/lymphatic: negative for bleeding, easy bruising, lymphadenopathy and petechiae Skin:  Negative for lesions, sores, or rashes. Psychiatric:Negative for depression, anxiety, or insomnia    General appearance: alert, cooperative, appears stated age, cachectic, fatigued and pale Head: Normocephalic, without obvious abnormality, atraumatic, sinuses nontender to percussion Eyes: conjunctivae/corneas clear. PERRL, EOM's intact. Fundi benign. Throat: lips, mucosa, and tongue normal; teeth and gums normal and mild thrush and erythema of tongue and buccal mucosa Neck: no adenopathy, no carotid bruit, no JVD, supple, symmetrical, trachea midline and thyroid not enlarged, symmetric, no tenderness/mass/nodules Resp: diminished breath sounds bilaterally and no rales, ronchi, or wheezes auscultated.  Mild conversational dyspnea and increased work of breathing. Chest wall: no tenderness Cardio: regular rate and rhythm, S1, S2 normal, no murmur, click, rub or gallop and no rub GI: soft, non-tender; bowel sounds normal; no masses,  no organomegaly Extremities: extremities normal, atraumatic, no cyanosis or edema and Homans sign is negative, no sign of DVT Pulses: Pulses diminished bilaterally. Skin: Skin color, texture, turgor normal. No rashes or lesions Lymph nodes: Cervical, supraclavicular, and axillary nodes normal. Neurologic: Alert and oriented X 3, normal strength and tone. Normal symmetric reflexes. Normal coordination and gait   Results for orders placed during the hospital encounter  of 10/03/11 (from the past 48 hour(s))  CBC     Status: Abnormal   Collection Time   10/03/11 12:46 PM      Component Value Range Comment   WBC 12.4 (*) 4.0 - 10.5 (K/uL)    RBC 3.17 (*) 4.22 - 5.81 (MIL/uL)    Hemoglobin 10.3 (*) 13.0 - 17.0 (g/dL)    HCT 16.1 (*) 09.6 - 52.0 (%)    MCV 90.5  78.0 - 100.0 (fL)    MCH 32.5  26.0 - 34.0 (pg)    MCHC 35.9  30.0 - 36.0 (g/dL)    RDW 04.5  40.9 - 81.1 (%)    Platelets 218  150 - 400 (K/uL)   DIFFERENTIAL     Status: Abnormal   Collection Time   10/03/11 12:46 PM      Component Value Range Comment   Neutrophils Relative 77  43 - 77 (%)  Lymphocytes Relative 13  12 - 46 (%)    Monocytes Relative 9  3 - 12 (%)    Eosinophils Relative 1  0 - 5 (%)    Basophils Relative 0  0 - 1 (%)    Neutro Abs 9.6 (*) 1.7 - 7.7 (K/uL)    Lymphs Abs 1.6  0.7 - 4.0 (K/uL)    Monocytes Absolute 1.1 (*) 0.1 - 1.0 (K/uL)    Eosinophils Absolute 0.1  0.0 - 0.7 (K/uL)    Basophils Absolute 0.0  0.0 - 0.1 (K/uL)    RBC Morphology POLYCHROMASIA PRESENT     COMPREHENSIVE METABOLIC PANEL     Status: Abnormal   Collection Time   10/03/11 12:46 PM      Component Value Range Comment   Sodium 125 (*) 135 - 145 (mEq/L)    Potassium 4.6  3.5 - 5.1 (mEq/L)    Chloride 93 (*) 96 - 112 (mEq/L)    CO2 22  19 - 32 (mEq/L)    Glucose, Bld 165 (*) 70 - 99 (mg/dL)    BUN 42 (*) 6 - 23 (mg/dL)    Creatinine, Ser 4.09  0.50 - 1.35 (mg/dL)    Calcium 8.7  8.4 - 10.5 (mg/dL)    Total Protein 4.7 (*) 6.0 - 8.3 (g/dL)    Albumin 2.4 (*) 3.5 - 5.2 (g/dL)    AST 12  0 - 37 (U/L)    ALT 15  0 - 53 (U/L)    Alkaline Phosphatase 44  39 - 117 (U/L)    Total Bilirubin 0.2 (*) 0.3 - 1.2 (mg/dL)    GFR calc non Af Amer 76 (*) >90 (mL/min)    GFR calc Af Amer 88 (*) >90 (mL/min)   CARDIAC PANEL(CRET KIN+CKTOT+MB+TROPI)     Status: Normal   Collection Time   10/03/11 12:49 PM      Component Value Range Comment   Total CK 16  7 - 232 (U/L)    CK, MB 2.1  0.3 - 4.0 (ng/mL)     Troponin I <0.30  <0.30 (ng/mL)    Relative Index RELATIVE INDEX IS INVALID  0.0 - 2.5     @[id number] @ *RADIOLOGY REPORT*  Clinical Data: Hypotension, recent pneumonia  PORTABLE CHEST - 1 VIEW  Comparison: 09/29/2011  Findings: Cardiomediastinal silhouette is stable. Stable  hyperinflation and chronic interstitial prominence. No pulmonary  edema. Streaky bilateral basilar atelectasis or early infiltrate.  IMPRESSION:  Hyperinflation and chronic interstitial prominence without  convincing pulmonary edema. Streaky bilateral basilar atelectasis  or early infiltrate.  Original Report Authenticated By: Natasha Mead, M.D.  Blood pressure 76/33, pulse 84, temperature 97.3 F (36.3 C), temperature source Oral, resp. rate 14, weight 61.236 kg (135 lb), SpO2 99.00%.    Assessment/Plan 1. Hypotension  DDX) 1.  Poor PO intake due to soreness in mouth with emesis this am. 2. Blood loss anemia from GI bleed due to candidiasis 3.  Pt may be septic from HCAP as he has recently concluded a lengthy inpatient stay for COPD exac.  His chest x-ray indicates some streaky atelectasis vs infiltrate, he has purulent sputum, but no new respiratory symptoms and a WBC count that, while elevated is still lower than it was on discharge and he is still receiving steroids.  - Will admit to tele, check procalcitonin and lactic acid, give IV fluids, IV Diflucan, and cover for HCAP.  We will monitor pts H&H and give him IV protonix. 2. Hyperuricemia - concerning  for GI bleed given pt's history of melena.  May also be due to dehydration to some degree.  Will hydrate, follow H&H, and heme occult stools. 3.  Thrush - concern for esophageal vs disseminated candidiasis.  Pt has requested clear liquid diet.  Pt will receive IV diflucan. 4. Chronic respiratory failure  Continue steroids as previously prescribed and nebulizer treatments 5. DMII - complicates care of all.  Is exacerbated by need for steroids for COPD and complicates  treatment of candidiasis.  Continue metformin and follow FSBS with SSI. 6. CAD - continue home meds.  Pt will be on telemetry. 7. Generalized Weakness - Pt will undoubtedly require return to SNF following this admission. 8. DVT prophylaxis.  StopLovenox if H/H drops.   Alver Leete 10/03/2011, 2:41 PM

## 2011-10-03 NOTE — ED Notes (Signed)
Family at bedside. 

## 2011-10-03 NOTE — ED Notes (Signed)
D/c recently for pna. Pt. From [facility]. Normal sbp runs high 80's; ems manual bp 60/30. HR 50, NSR. 20 ga. Lt. A/c.

## 2011-10-04 LAB — URINALYSIS, ROUTINE W REFLEX MICROSCOPIC
Bilirubin Urine: NEGATIVE
Ketones, ur: NEGATIVE mg/dL
Nitrite: NEGATIVE
Specific Gravity, Urine: 1.01 (ref 1.005–1.030)
Urobilinogen, UA: 0.2 mg/dL (ref 0.0–1.0)
pH: 6.5 (ref 5.0–8.0)

## 2011-10-04 LAB — CBC
Hemoglobin: 9.8 g/dL — ABNORMAL LOW (ref 13.0–17.0)
Hemoglobin: 9.6 g/dL — ABNORMAL LOW (ref 13.0–17.0)
MCH: 32.3 pg (ref 26.0–34.0)
MCHC: 35.7 g/dL (ref 30.0–36.0)
MCV: 90.6 fL (ref 78.0–100.0)
Platelets: 244 10*3/uL (ref 150–400)
RBC: 3.1 MIL/uL — ABNORMAL LOW (ref 4.22–5.81)
RBC: 2.97 MIL/uL — ABNORMAL LOW (ref 4.22–5.81)

## 2011-10-04 LAB — GLUCOSE, CAPILLARY
Glucose-Capillary: 59 mg/dL — ABNORMAL LOW (ref 70–99)
Glucose-Capillary: 135 mg/dL — ABNORMAL HIGH (ref 70–99)
Glucose-Capillary: 226 mg/dL — ABNORMAL HIGH (ref 70–99)
Glucose-Capillary: 142 mg/dL — ABNORMAL HIGH (ref 70–99)

## 2011-10-04 LAB — COMPREHENSIVE METABOLIC PANEL
ALT: 14 U/L (ref 0–53)
Alkaline Phosphatase: 43 U/L (ref 39–117)
BUN: 24 mg/dL — ABNORMAL HIGH (ref 6–23)
Chloride: 100 mEq/L (ref 96–112)
GFR calc Af Amer: >90 mL/min (ref >90)
Glucose, Bld: 60 mg/dL — ABNORMAL LOW (ref 70–99)
Potassium: 4.3 mEq/L (ref 3.5–5.1)
Sodium: 131 mEq/L — ABNORMAL LOW (ref 135–145)
Total Bilirubin: 0.4 mg/dL (ref 0.3–1.2)
Total Protein: 4.6 g/dL — ABNORMAL LOW (ref 6.0–8.3)

## 2011-10-04 LAB — DIFFERENTIAL
Basophils Relative: 0 % (ref 0–1)
Eosinophils Absolute: 0.1 10*3/uL (ref 0.0–0.7)
Eosinophils Relative: 1 % (ref 0–5)
Lymphs Abs: 2.1 10*3/uL (ref 0.7–4.0)
Monocytes Absolute: 0.8 10*3/uL (ref 0.1–1.0)

## 2011-10-04 LAB — CREATININE, SERUM
Creatinine, Ser: 0.8 mg/dL (ref 0.50–1.35)
GFR calc non Af Amer: 83 mL/min — ABNORMAL LOW (ref >90)

## 2011-10-04 LAB — HEMOGLOBIN
Hemoglobin: 9.7 g/dL — ABNORMAL LOW (ref 13.0–17.0)
Hemoglobin: 10.4 g/dL — ABNORMAL LOW (ref 13.0–17.0)

## 2011-10-04 LAB — HEMATOCRIT: HCT: 27.6 % — ABNORMAL LOW (ref 39.0–52.0)

## 2011-10-04 LAB — MAGNESIUM: Magnesium: 1.9 mg/dL (ref 1.5–2.5)

## 2011-10-04 LAB — URINE MICROSCOPIC-ADD ON

## 2011-10-04 MED ORDER — DEXTROSE 50 % IV SOLN
INTRAVENOUS | Status: AC
  Administered 2011-10-04: 25 mL via INTRAVENOUS
  Filled 2011-10-04: qty 50

## 2011-10-04 MED ORDER — AZITHROMYCIN 500 MG PO TABS
500.0000 mg | ORAL_TABLET | Freq: Every day | ORAL | Status: DC
  Administered 2011-10-04 – 2011-10-06 (×3): 500 mg via ORAL
  Filled 2011-10-04 (×4): qty 1

## 2011-10-04 MED ORDER — FLUCONAZOLE 150 MG PO TABS: 150.0000 mg | ORAL_TABLET | Freq: Every day | ORAL | Status: DC

## 2011-10-04 MED ORDER — DEXTROSE 50 % IV SOLN
25.0000 mL | Freq: Once | INTRAVENOUS | Status: AC
  Administered 2011-10-04: 25 mL via INTRAVENOUS

## 2011-10-04 MED ORDER — BRINZOLAMIDE 1 % OP SUSP
1.0000 [drp] | Freq: Every day | OPHTHALMIC | Status: DC
  Filled 2011-10-04: qty 10

## 2011-10-04 MED ORDER — FLUCONAZOLE 100 MG PO TABS
100.0000 mg | ORAL_TABLET | Freq: Every day | ORAL | Status: DC
  Administered 2011-10-04 – 2011-10-08 (×5): 100 mg via ORAL
  Filled 2011-10-04 (×5): qty 1

## 2011-10-04 MED ORDER — AMOXICILLIN-POT CLAVULANATE 875-125 MG PO TABS
1.0000 | ORAL_TABLET | Freq: Two times a day (BID) | ORAL | Status: DC
  Administered 2011-10-04 – 2011-10-06 (×6): 1 via ORAL
  Filled 2011-10-04 (×8): qty 1

## 2011-10-04 NOTE — Progress Notes (Signed)
Inpatient Diabetes Program Recommendations  AACE/ADA: New Consensus Statement on Inpatient Glycemic Control (2009)  Target Ranges:  Prepandial:   less than 140 mg/dL      Peak postprandial:   less than 180 mg/dL (1-2 hours)      Critically ill patients:  140 - 180 mg/dL   Reason for Visit: Hypoglycemia this am (cbg 59,60 mg/dL)  Inpatient Diabetes Program Recommendations Insulin - Basal: With pt on reduced Prednisone dose, Lantus could now be discontinued.  Pt had hypoglycemia this am. Correction (SSI): xxxxx Insulin - Meal Coverage: xxxxx HgbA1C: xxxxx  Note: Pt on no home DM meds.

## 2011-10-04 NOTE — Progress Notes (Signed)
Subjective: Feeling much better.   Coughed up a lot of phlegm yesterday and has felt better since.  Depressed about wife's dementia and decline  Objective: Weight change:   Intake/Output Summary (Last 24 hours) at 10/04/11 0818 Last data filed at 10/04/11 0659  Gross per 24 hour  Intake 2781.25 ml  Output   1925 ml  Net 856.25 ml   Filed Vitals:   10/03/11 2326 10/03/11 2358 10/04/11 0343 10/04/11 0759  BP: 90/49  93/58   Pulse: 100  82   Temp: 98.1 F (36.7 C)  97.6 F (36.4 C)   TempSrc: Oral  Oral   Resp: 16  14   Height:      Weight:      SpO2: 98% 98% 100% 96%    General Appearance: Alert, cooperative, no distress, appears stated age Head: Normocephalic, without obvious abnormality, atraumatic, temples with mild wasting Neck: Supple, symmetrical Lungs: Distant breath sounds, cough not congested Heart: Regular rate and rhythm, S1 and S2 normal, no murmur, rub or gallop Abdomen: Soft, non-tender, bowel sounds active all four quadrants, no masses, no organomegaly Extremities: Extremities normal, atraumatic, no cyanosis or edema Pulses: 2+ and symmetric all extremities Skin: Skin color, texture, turgor normal, no rashes or lesions Neuro: CNII-XII intact. Normal strength, sensation and reflexes throughout   Lab Results:  [facility] 10/04/11 0546 10/04/11 0050 10/03/11 1246  NA 131* -- 125*  K 4.3 -- 4.6  CL 100 -- 93*  CO2 25 -- 22  GLUCOSE 60* -- 165*  BUN 24* -- 42*  CREATININE 0.74 0.80 --  CALCIUM 8.3* -- 8.7  MG 1.9 -- --  PHOS -- -- --    Basename 10/04/11 0546 10/03/11 1246  AST 11 12  ALT 14 15  ALKPHOS 43 44  BILITOT 0.4 0.2*  PROT 4.6* 4.7*  ALBUMIN 2.4* 2.4*   No results found for this basename: LIPASE:2,AMYLASE:2 in the last 72 hours  Basename 10/04/11 0546 10/04/11 0050 10/03/11 1246  WBC 9.9 12.1* --  NEUTROABS PENDING -- 9.6*  HGB 9.6* 9.8* --  HCT 26.9* 27.9* --  MCV 90.6 90.0 --  PLT 211 244 --    Basename 10/03/11 1249  CKTOTAL  16  CKMB 2.1  CKMBINDEX --  TROPONINI <0.30   No components found with this basename: POCBNP:3 No results found for this basename: DDIMER:2 in the last 72 hours No results found for this basename: HGBA1C:2 in the last 72 hours No results found for this basename: CHOL:2,HDL:2,LDLCALC:2,TRIG:2,CHOLHDL:2,LDLDIRECT:2 in the last 72 hours No results found for this basename: TSH,T4TOTAL,FREET3,T3FREE,THYROIDAB in the last 72 hours No results found for this basename: VITAMINB12:2,FOLATE:2,FERRITIN:2,TIBC:2,IRON:2,RETICCTPCT:2 in the last 72 hours  Studies/Results: Dg Chest Portable 1 View  10/03/2011  *RADIOLOGY REPORT*  Clinical Data: Hypotension, recent pneumonia  PORTABLE CHEST - 1 VIEW  Comparison: 09/29/2011  Findings: Cardiomediastinal silhouette is stable.  Stable hyperinflation and chronic interstitial prominence. No pulmonary edema.  Streaky bilateral basilar atelectasis or early infiltrate.  IMPRESSION: Hyperinflation and chronic interstitial prominence without convincing pulmonary edema.  Streaky bilateral basilar atelectasis or early infiltrate.  Original Report Authenticated By: Natasha Mead, M.D.   Medications: Scheduled Meds:   . sodium chloride   Intravenous Once  . albuterol  2.5 mg Nebulization Q6H  . amoxicillin-clavulanate  1 tablet Oral Q12H  . aspirin EC  81 mg Oral Daily  . azithromycin  500 mg Oral Daily  . brinzolamide  1 drop Left Eye Q0700  . dextrose      .  docusate sodium  100 mg Oral BID  . dorzolamide  1 drop Left Eye QAC breakfast  . enoxaparin  40 mg Subcutaneous Q24H  . ezetimibe  10 mg Oral Daily  . Flora-Q  1 capsule Oral Daily  . fluconazole (DIFLUCAN) IV  200 mg Intravenous Once  . fluconazole  150 mg Oral Daily  . fluticasone  2 puff Inhalation BID  . insulin aspart  0-9 Units Subcutaneous TID WC  . insulin glargine  10 Units Subcutaneous QHS  . ipratropium  0.5 mg Nebulization TID  . metFORMIN  500 mg Oral BID WC  . nystatin  5 mL Oral QID  .  piperacillin-tazobactam (ZOSYN)  IV  3.375 g Intravenous Once  . predniSONE  20 mg Oral Q breakfast  . sodium chloride      . Travoprost (BAK Free)  1 drop Both Eyes QHS  . vancomycin  750 mg Intravenous Once  . DISCONTD: azithromycin  500 mg Intravenous Q24H  . DISCONTD: fluconazole (DIFLUCAN) IV  100 mg Intravenous Q24H  . DISCONTD: piperacillin-tazobactam (ZOSYN)  IV  3.375 g Intravenous Q8H  . DISCONTD: piperacillin-tazobactam (ZOSYN)  IV  3.375 g Intravenous Q8H  . DISCONTD: travoprost (benzalkonium)  1 drop Both Eyes QHS  . DISCONTD: vancomycin  500 mg Intravenous Q12H  . DISCONTD: vancomycin  1,000 mg Intravenous Q12H   Continuous Infusions:   . sodium chloride 75 mL/hr at 10/04/11 0034   PRN Meds:.acetaminophen, acetaminophen, albuterol, ALPRAZolam, alum & mag hydroxide-simeth, ondansetron (ZOFRAN) IV, ondansetron  Assessment/Plan: Patient Active Problem List  Diagnoses Date Noted  . Hypotension, unspecified - improved - watch one more day in Stepdown 10/03/2011    Class: Acute  . Chronic respiratory failure - improved 10/03/2011    Class: Chronic  . Weakness generalized - improving 09/27/2011  . Pneumonia  - Cont augmentin and azith 09/27/2011  . Diabetes mellitus 09/26/2011  . Thrush - oral diflucan Day #1 09/26/2011  . COPD (chronic obstructive pulmonary disease) - nebs and Pred tx 09/19/2011  . Acute-on-chronic respiratory failure  - improved 09/19/2011  . CAD (coronary artery disease), native coronary artery 09/19/2011  . Peripheral vascular disease 09/19/2011     LOS: 1 day   Joseph [name] 10/04/2011, 8:18 AM

## 2011-10-05 DIAGNOSIS — I959 Hypotension, unspecified: Secondary | ICD-10-CM

## 2011-10-05 DIAGNOSIS — J4489 Other specified chronic obstructive pulmonary disease: Secondary | ICD-10-CM

## 2011-10-05 DIAGNOSIS — J449 Chronic obstructive pulmonary disease, unspecified: Secondary | ICD-10-CM

## 2011-10-05 DIAGNOSIS — E860 Dehydration: Secondary | ICD-10-CM

## 2011-10-05 LAB — PRO B NATRIURETIC PEPTIDE: Pro B Natriuretic peptide (BNP): 263.3 pg/mL (ref 0–450)

## 2011-10-05 LAB — HEMATOCRIT: HCT: 28.2 % — ABNORMAL LOW (ref 39.0–52.0)

## 2011-10-05 LAB — BASIC METABOLIC PANEL
CO2: 27 mEq/L (ref 19–32)
Chloride: 101 mEq/L (ref 96–112)
Creatinine, Ser: 0.71 mg/dL (ref 0.50–1.35)
GFR calc Af Amer: >90 mL/min (ref >90)
Potassium: 4.6 mEq/L (ref 3.5–5.1)
Sodium: 132 mEq/L — ABNORMAL LOW (ref 135–145)

## 2011-10-05 LAB — GLUCOSE, CAPILLARY
Glucose-Capillary: 168 mg/dL — ABNORMAL HIGH (ref 70–99)
Glucose-Capillary: 270 mg/dL — ABNORMAL HIGH (ref 70–99)
Glucose-Capillary: 141 mg/dL — ABNORMAL HIGH (ref 70–99)

## 2011-10-05 LAB — HEMOGLOBIN
Hemoglobin: 9.2 g/dL — ABNORMAL LOW (ref 13.0–17.0)
Hemoglobin: 10.1 g/dL — ABNORMAL LOW (ref 13.0–17.0)

## 2011-10-05 LAB — CBC
HCT: 27.7 % — ABNORMAL LOW (ref 39.0–52.0)
MCHC: 35 g/dL (ref 30.0–36.0)
RDW: 14.2 % (ref 11.5–15.5)
WBC: 10.4 10*3/uL (ref 4.0–10.5)

## 2011-10-05 LAB — DIFFERENTIAL
Basophils Absolute: 0 10*3/uL (ref 0.0–0.1)
Basophils Relative: 0 % (ref 0–1)
Lymphocytes Relative: 24 % (ref 12–46)
Monocytes Absolute: 1.1 10*3/uL — ABNORMAL HIGH (ref 0.1–1.0)
Neutro Abs: 6.7 10*3/uL (ref 1.7–7.7)
Neutrophils Relative %: 65 % (ref 43–77)

## 2011-10-05 MED ORDER — BRINZOLAMIDE 1 % OP SUSP
1.0000 [drp] | Freq: Every day | OPHTHALMIC | Status: DC
  Administered 2011-10-06 – 2011-10-08 (×3): 1 [drp] via OPHTHALMIC
  Filled 2011-10-05: qty 10

## 2011-10-05 NOTE — Progress Notes (Signed)
CRITICAL VALUE ALERT  Critical value received:  CBG 55  Date of notification: 10-05-11  Time of notification: 0506  Critical value read back: yes   Nurse who received alert: Brietta Manso, RN  MD notified (1st page): none  Time of first page: N/A  MD notified (2nd page):  Time of second page:  Responding MD: N/A  Time MD responded: N/A

## 2011-10-05 NOTE — Progress Notes (Signed)
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  Problem List:  Patient Active Problem List  Diagnoses  . COPD (chronic obstructive pulmonary disease)  . Acute-on-chronic respiratory failure  . CAD (coronary artery disease), native coronary artery  . Peripheral vascular disease  . Diabetes mellitus  . Thrush  . Weakness generalized  . Pneumonia  . Hypotension, unspecified  . Chronic respiratory failure    Past Medical History:  Past Medical History  Diagnosis Date  . Diabetes mellitus   . Hyperlipidemia   . Myocardial infarction   . COPD (chronic obstructive pulmonary disease)   . Emphysema of lung   . Abdominal aneurysm without mention of rupture   . Thyroid disease     Hyperthyroidism, Goiter  . Hypotension    Past Surgical History:  Past Surgical History  Procedure Date  . Hernia repair     LEFT INGUINAL  . Abdominal aorta stent     ENDOSTENT REPAIR 10/15/2010  . Prostate surgery   . Abdominal aortic aneurysm repair 05/26/11    PEVAR    PT Assessment/Plan/Recommendation PT Assessment Clinical Impression Statement: Patient admitted with hypotension and recent hospitalization with respiratory illness.  Presents with limited activity tolerance, decreased balance and decreased independence with mobility and will benefit from skilled PT in the acute setting to maximize independence for return home after SNF stay. PT Recommendation/Assessment: Patient will need skilled PT in the acute care venue PT Problem List: Decreased strength;Decreased activity tolerance;Decreased balance;Decreased mobility;Cardiopulmonary status limiting activity Barriers to Discharge: Decreased caregiver support PT Therapy Diagnosis : Abnormality of gait;Generalized weakness PT  Plan PT Frequency: Min 3X/week PT Treatment/Interventions: Gait training;DME instruction;Functional mobility training;Patient/family education;Therapeutic activities;Therapeutic exercise;Balance training PT Recommendation Follow Up Recommendations: Skilled nursing facility Equipment Recommended: Defer to next venue PT Goals  Acute Rehab PT Goals PT Goal Formulation: With patient Time For Goal Achievement: 2 weeks Pt will go Sit to Supine/Side: with modified independence PT Goal: Sit to Supine/Side - Progress: Not met Pt will go Sit to Stand: with modified independence PT Goal: Sit to Stand - Progress: Not met Pt will go Stand to Sit: with modified independence PT Goal: Stand to Sit - Progress: Not met Pt will Ambulate: >150 feet;with least restrictive assistive device;with supervision PT Goal: Ambulate - Progress: Not met Pt will Go Up / Down Stairs: 1-2 stairs;with min assist PT Goal: Up/Down Stairs - Progress: Not met  PT Evaluation Precautions/Restrictions  Precautions Precautions: Fall Precaution Comments: check orthostatics Prior Functioning  Home Living Type of Home: Skilled Nursing Facility (at [facility] prior to admit, at home before 12/16 admit) Home Layout: One level Home Access: Stairs to enter Entrance Stairs-Rails: None Entrance Stairs-Number of Steps: 2 Bathroom Shower/Tub: Forensic scientist: Handicapped height Home Adaptive Equipment: Walker - rolling;Shower chair with back;Straight cane Prior Function Level of Independence: Independent with basic ADLs;Independent with gait;Independent with homemaking with ambulation Vocation: Retired Comments: 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 Cognition Cognition Arousal/Alertness: Awake/alert Overall Cognitive Status: Appears within functional limits for tasks assessed Orientation Level: Oriented X4 Sensation/Coordination Sensation Light Touch: Impaired  Detail Light Touch Impaired Details: Impaired LLE;Impaired RLE Additional Comments: complains of some numbness due to in bed too long Extremity Assessment RLE Assessment RLE Assessment: Within Functional Limits LLE Assessment LLE Assessment: Within Functional Limits Mobility (  including Balance) Orthostatic BPs  Supine 127/65  Sitting 104/65  Standing 95/44  Sitting after ambulating 128/61   Bed Mobility Rolling Left: With rail;6: Modified independent (Device/Increase time) Left Sidelying to Sit: 6: Modified independent (Device/Increase time) Sit to Supine - Left: 5: Supervision;With rail Sit to Supine - Left Details (indicate cue type and reason): due to extra effort to lift legs into bed Transfers Sit to Stand: 5: Supervision;From bed;With upper extremity assist Sit to Stand Details (indicate cue type and reason): for safety, pulls up on walker Stand to Sit: 5: Supervision;To bed Stand to Sit Details: cues for hands on bed prior to sit Ambulation/Gait Ambulation/Gait Assistance: 4: Min assist Ambulation/Gait Assistance Details (indicate cue type and reason): min assist for balance/safety Ambulation Distance (Feet): 80 Feet Assistive device: Rolling walker Gait Pattern: Step-through pattern;Decreased stride length;Trunk flexed  Static Sitting Balance Static Sitting - Balance Support: Feet unsupported;No upper extremity supported Static Sitting - Level of Assistance: 7: Independent Static Sitting - Comment/# of Minutes: able to accept challenges, but stated unable to reach feet due to stiffness Exercise    End of Session PT - End of Session Equipment Utilized During Treatment: Gait belt Activity Tolerance: Patient tolerated treatment well Patient left: in bed;with call bell in reach General Behavior During Session: [facility] for tasks performed Cognition: [facility] for tasks performed  [facility] 10/05/2011, 3:40 PM

## 2011-10-05 NOTE — Progress Notes (Signed)
Subjective: Admitted for hypotension, blood loss anemia, question of sepsis.Reports he is feeling better. The painful mouth from thrush is better, he is not feeling SOB.  Objective: Lab: CBC    Component Value Date/Time   WBC 10.4 10/05/2011 0447   RBC 3.06* 10/05/2011 0447   HGB 9.7* 10/05/2011 0447   HCT 27.7* 10/05/2011 0447   PLT 228 10/05/2011 0447   MCV 90.5 10/05/2011 0447   MCH 31.7 10/05/2011 0447   MCHC 35.0 10/05/2011 0447   RDW 14.2 10/05/2011 0447   LYMPHSABS 2.5 10/05/2011 0447   MONOABS 1.1* 10/05/2011 0447   EOSABS 0.1 10/05/2011 0447   BASOSABS 0.0 10/05/2011 0447    BMET    Component Value Date/Time   NA 132* 10/05/2011 0447   K 4.6 10/05/2011 0447   CL 101 10/05/2011 0447   CO2 27 10/05/2011 0447   GLUCOSE 47* 10/05/2011 0447   BUN 17 10/05/2011 0447   CREATININE 0.71 10/05/2011 0447   CALCIUM 8.7 10/05/2011 0447   GFRNONAA 87* 10/05/2011 0447   GFRAA >90 10/05/2011 0447      Imaging: no new imaging   Physical Exam: Filed Vitals:   10/05/11 0800  BP: 111/57  Pulse: 81  Temp: 97 F (36.1 C)  Resp: 13  Gen'l - elderly white man in no acute distress HEENT- edentulous, no plaque on the tongue Pulm - no increased WOB, decreased breath sounds, no wheezes or rales Cor- distant heart sounds, regular Abd - soft, BS+ Neuro - A&O x 3     Assessment/Plan: 1. Hypotension - BP is in normal range. 2. Pulm - no labored respirations, O2 sats are good 3. PNA- being treated for HCAP day # Augmentin, azithromycin Plan - continue antibiotics 4. DM - CBGs in normal range. 5. Thrush - improving. Day #2 diflucan Improving - will continue in step-down for another 24 hours.   Casimiro Needle Marjon Doxtater 10/05/2011, 11:08 AM

## 2011-10-06 LAB — GLUCOSE, CAPILLARY
Glucose-Capillary: 181 mg/dL — ABNORMAL HIGH (ref 70–99)
Glucose-Capillary: 250 mg/dL — ABNORMAL HIGH (ref 70–99)

## 2011-10-06 LAB — HEMATOCRIT
HCT: 25.8 % — ABNORMAL LOW (ref 39.0–52.0)
HCT: 28.7 % — ABNORMAL LOW (ref 39.0–52.0)
HCT: 29.1 % — ABNORMAL LOW (ref 39.0–52.0)
HCT: 26.6 % — ABNORMAL LOW (ref 39.0–52.0)

## 2011-10-06 LAB — HEMOGLOBIN
Hemoglobin: 8.9 g/dL — ABNORMAL LOW (ref 13.0–17.0)
Hemoglobin: 10.3 g/dL — ABNORMAL LOW (ref 13.0–17.0)

## 2011-10-06 NOTE — Clinical Documentation Improvement (Signed)
POA DOCUMENTATION CLARIFICATION QUERY  THIS DOCUMENT IS NOT A PERMANENT PART OF THE MEDICAL RECORD  TO RESPOND TO THE THIS QUERY, FOLLOW THE INSTRUCTIONS BELOW:  1. If needed, update documentation for the patient's encounter via the notes activity.  2. Access this query again and click edit on the In [employer].  3. After updating, or not, click F2 to complete all highlighted (required) fields concerning your review. Select "additional documentation in the medical record" OR "no additional documentation provided".  4. Click Sign note button.  5. The deficiency will fall out of your In Basket *Please let us know if you are not able to complete this workflow by phone or e-mail (listed below).  Please update your documentation within the medical record to reflect your response to this query.                                                                                    10/06/11  Dear Dr. Anders Simmonds. [employer]  In a better effort to capture your patient's severity of illness, reflect appropriate length of stay and utilization of resources, a review of the patient medical record has revealed the following indicators.    Based on your clinical judgment, please clarify and document in a progress note and/or discharge summary the clinical condition associated with the following supporting information:  In responding to this query please exercise your independent judgment.  The fact that a query is asked, does not imply that any particular answer is desired or expected.  Medicare rules require specification as to whether an inpatient diagnosis was present at the time of admission.    MD documentation of "blood loss anemia" in progress notes and H+P.  Please clarify if the  diagnosis was  "acute" or "chronic" or "mild"                was: Acute or Chronic or Mild "Blood Loss Anemia"  _ Present at the time of admission  _ NOT present at the time of inpatient admission and it developed  during the inpatient stay  _ Unable to clinically determine whether the condition was present on admission.  _  Documentation insufficient to determine if condition was present at the time of inpatient admission  You may use possible, probable, or suspect with inpatient documentation. possible, probable, suspected diagnoses MUST be documented at the time of discharge  Reviewed: nothing documented in chart or discharge summary discharge pending today.   Thank You,  Leonette Most Reiley Bertagnolli  Clinical Documentation Specialist:  Pager  Health Information Management [place]

## 2011-10-06 NOTE — Progress Notes (Signed)
Subjective: Feeling much better today. Still weak. Able to work with physical therapy yesterday  Objective: Weight change:   Intake/Output Summary (Last 24 hours) at 10/06/11 0747 Last data filed at 10/06/11 0700  Gross per 24 hour  Intake   1440 ml  Output   4100 ml  Net  -2660 ml   Filed Vitals:   10/05/11 2048 10/05/11 2322 10/06/11 0000 10/06/11 0348  BP:  134/55  106/47  Pulse: 90 87 81 80  Temp: 97.7 F (36.5 C) 98.8 F (37.1 C)  97.9 F (36.6 C)  TempSrc: Oral Oral  Oral  Resp: 19 16 14 23   Height:      Weight:      SpO2: 97% 96% 96% 96%   General Appearance: Alert, cooperative, no distress, appears stated age  Head: Normocephalic, without obvious abnormality, atraumatic, temples with mild wasting  Neck: Supple, symmetrical  Lungs: Distant breath sounds, cough not congested  Heart: Regular rate and rhythm, S1 and S2 normal, no murmur, rub or gallop  Abdomen: Soft, non-tender, bowel sounds active all four quadrants, no masses, no organomegaly  Extremities: Extremities normal, atraumatic, no cyanosis or edema  Pulses: 2+ and symmetric all extremities  Skin: Skin color, texture, turgor normal, no rashes or lesions  Neuro: CNII-XII intact. Normal strength, sensation and reflexes throughout   Lab Results:  [facility] 10/05/11 0447 10/04/11 0546  NA 132* 131*  K 4.6 4.3  CL 101 100  CO2 27 25  GLUCOSE 47* 60*  BUN 17 24*  CREATININE 0.71 0.74  CALCIUM 8.7 8.3*  MG -- 1.9  PHOS -- --    Basename 10/04/11 0546 10/03/11 1246  AST 11 12  ALT 14 15  ALKPHOS 43 44  BILITOT 0.4 0.2*  PROT 4.6* 4.7*  ALBUMIN 2.4* 2.4*   No results found for this basename: LIPASE:2,AMYLASE:2 in the last 72 hours  Basename 10/06/11 0555 10/06/11 0015 10/05/11 0447 10/04/11 0546  WBC -- -- 10.4 9.9  NEUTROABS -- -- 6.7 6.9  HGB 10.0* 8.9* -- --  HCT 28.7* 25.8* -- --  MCV -- -- 90.5 90.6  PLT -- -- 228 211    Basename 10/03/11 1249  CKTOTAL 16  CKMB 2.1  CKMBINDEX --    TROPONINI <0.30   No components found with this basename: POCBNP:3 No results found for this basename: DDIMER:2 in the last 72 hours No results found for this basename: HGBA1C:2 in the last 72 hours No results found for this basename: CHOL:2,HDL:2,LDLCALC:2,TRIG:2,CHOLHDL:2,LDLDIRECT:2 in the last 72 hours No results found for this basename: TSH,T4TOTAL,FREET3,T3FREE,THYROIDAB in the last 72 hours No results found for this basename: VITAMINB12:2,FOLATE:2,FERRITIN:2,TIBC:2,IRON:2,RETICCTPCT:2 in the last 72 hours  Studies/Results: No results found. Medications: Scheduled Meds:   . albuterol  2.5 mg Nebulization Q6H  . amoxicillin-clavulanate  1 tablet Oral Q12H  . aspirin EC  81 mg Oral Daily  . azithromycin  500 mg Oral Daily  . brinzolamide  1 drop Left Eye Daily  . docusate sodium  100 mg Oral BID  . enoxaparin  40 mg Subcutaneous Q24H  . ezetimibe  10 mg Oral Daily  . Flora-Q  1 capsule Oral Daily  . fluconazole  100 mg Oral Daily  . fluticasone  2 puff Inhalation BID  . insulin aspart  0-9 Units Subcutaneous TID WC  . insulin glargine  10 Units Subcutaneous QHS  . ipratropium  0.5 mg Nebulization TID  . metFORMIN  500 mg Oral BID WC  . nystatin  5 mL  Oral QID  . predniSONE  20 mg Oral Q breakfast  . Travoprost (BAK Free)  1 drop Both Eyes QHS  . DISCONTD: brinzolamide  1 drop Left Eye Daily   Continuous Infusions:   . sodium chloride 50 mL/hr at 10/04/11 1900   PRN Meds:.acetaminophen, acetaminophen, albuterol, ALPRAZolam, alum & mag hydroxide-simeth, ondansetron (ZOFRAN) IV, ondansetron  Assessment/Plan: Patient Active Problem List   Diagnoses  Date Noted   .  Hypotension, unspecified - improved - transfer from step down today  10/03/2011     Class: Acute   .  Chronic respiratory failure - improved  10/03/2011     Class: Chronic   .  Weakness generalized - improving  09/27/2011   .  Pneumonia - Cont augmentin and azith  09/27/2011   .  Diabetes mellitus   09/26/2011   .  Thrush - oral diflucan Day #3  09/26/2011   .  COPD (chronic obstructive pulmonary disease) - nebs and Pred tx  09/19/2011   .  Acute-on-chronic respiratory failure - improved  09/19/2011   .  CAD (coronary artery disease), native coronary artery  09/19/2011   .  Peripheral vascular disease  09/19/2011       LOS: 3 days   Joseph [name] 10/06/2011, 7:47 AM

## 2011-10-06 NOTE — Progress Notes (Signed)
Physical Therapy Treatment Patient Details Name: Joseph [name] MRN: [id number] DOB: [birth year] Today's Date: 10/06/2011  PT Assessment/Plan  PT - Assessment/Plan Comments on Treatment Session: Motivated and progressing.  Pt wants to do as much as possible to get him stronger. PT Plan: Discharge plan remains appropriate;Frequency remains appropriate PT Frequency: Min 3X/week Follow Up Recommendations: Skilled nursing facility Equipment Recommended: Defer to next venue PT Goals  Acute Rehab PT Goals PT Goal Formulation: With patient Time For Goal Achievement: 2 weeks PT Goal: Supine/Side to Sit - Progress: Met PT Goal: Sit to Supine/Side - Progress: Met PT Goal: Sit to Stand - Progress: Progressing toward goal PT Goal: Stand to Sit - Progress: Progressing toward goal PT Goal: Ambulate - Progress: Progressing toward goal  PT Treatment Precautions/Restrictions  Precautions Precautions: Fall Precaution Comments: check orthostatics Required Braces or Orthoses: No Restrictions Weight Bearing Restrictions: No Pain 0/10 with treatment. Vitals No c/o dizziness with any mobility. Mobility (including Balance) Bed Mobility Bed Mobility: Yes Rolling Left: Not tested (comment) Left Sidelying to Sit: Not tested (comment) Supine to Sit: 7: Independent Sit to Supine - Left: 7: Independent Transfers Transfers: Yes Sit to Stand: 5: Supervision;With upper extremity assist;From bed Sit to Stand Details (indicate cue type and reason): Verbal cues for safest hand placement. Stand to Sit: 5: Supervision;With upper extremity assist;To bed Stand to Sit Details: Verbal cues for safest hand placement. Ambulation/Gait Ambulation/Gait: Yes Ambulation/Gait Assistance: 4: Min assist (Min (guard)) Ambulation/Gait Assistance Details (indicate cue type and reason): Guarding for balance with cues for tall posture and safety inside RW. Ambulation Distance (Feet): 160 Feet Assistive device: Rolling  walker Gait Pattern: Decreased step length - right;Decreased step length - left;Trunk flexed Stairs: No Wheelchair Mobility Wheelchair Mobility: No  Posture/Postural Control Posture/Postural Control: No significant limitations Balance Balance Assessed: No End of Session PT - End of Session Equipment Utilized During Treatment: Gait belt Activity Tolerance: Patient tolerated treatment well Patient left: in bed;with call bell in reach Nurse Communication: Mobility status for transfers;Mobility status for ambulation General Behavior During Session: [facility] for tasks performed Cognition: [facility] for tasks performed  Cephus Shelling 10/06/2011, 10:09 AM  10/06/2011 Cephus Shelling, PT, DPT [phone]

## 2011-10-06 NOTE — Progress Notes (Signed)
Inpatient Diabetes Program Recommendations  AACE/ADA: New Consensus Statement on Inpatient Glycemic Control (2009)  Target Ranges:  Prepandial:   less than 140 mg/dL      Peak postprandial:   less than 180 mg/dL (1-2 hours)      Critically ill patients:  140 - 180 mg/dL   Reason for Visit: Fasting CBG's low.  Consider discontinuation of Lantus insulin.  While on PO prednisone, would benefit from Novolog meal coverage.  Inpatient Diabetes Program Recommendations Insulin - Basal: Consider discontinuation of Lantus.   Correction (SSI): . Insulin - Meal Coverage: Consider Novolog meal coverage 2 units tid with meals (while on PO prednisone) HgbA1C: .

## 2011-10-06 NOTE — Progress Notes (Signed)
Report called to [id number] receiving RN. Pt transferred via wheelchair with belongings.    Roselie Awkward, RN

## 2011-10-07 LAB — HEMATOCRIT
HCT: 26.2 % — ABNORMAL LOW (ref 39.0–52.0)
HCT: 28.1 % — ABNORMAL LOW (ref 39.0–52.0)
HCT: 30.9 % — ABNORMAL LOW (ref 39.0–52.0)

## 2011-10-07 LAB — CBC
MCH: 32.2 pg (ref 26.0–34.0)
MCHC: 35.2 g/dL (ref 30.0–36.0)
MCV: 91.6 fL (ref 78.0–100.0)
Platelets: 218 10*3/uL (ref 150–400)
RBC: 3.2 MIL/uL — ABNORMAL LOW (ref 4.22–5.81)
RDW: 14.3 % (ref 11.5–15.5)

## 2011-10-07 LAB — BASIC METABOLIC PANEL
BUN: 11 mg/dL (ref 6–23)
CO2: 24 mEq/L (ref 19–32)
Calcium: 8.7 mg/dL (ref 8.4–10.5)
Creatinine, Ser: 0.64 mg/dL (ref 0.50–1.35)
GFR calc non Af Amer: >90 mL/min (ref >90)
Glucose, Bld: 78 mg/dL (ref 70–99)
Sodium: 132 mEq/L — ABNORMAL LOW (ref 135–145)

## 2011-10-07 LAB — DIFFERENTIAL
Basophils Absolute: 0 10*3/uL (ref 0.0–0.1)
Basophils Relative: 0 % (ref 0–1)
Eosinophils Absolute: 0.2 10*3/uL (ref 0.0–0.7)
Eosinophils Relative: 2 % (ref 0–5)
Lymphs Abs: 2.3 10*3/uL (ref 0.7–4.0)
Neutrophils Relative %: 63 % (ref 43–77)

## 2011-10-07 LAB — GLUCOSE, CAPILLARY
Glucose-Capillary: 85 mg/dL (ref 70–99)
Glucose-Capillary: 179 mg/dL — ABNORMAL HIGH (ref 70–99)
Glucose-Capillary: 129 mg/dL — ABNORMAL HIGH (ref 70–99)

## 2011-10-07 LAB — PRO B NATRIURETIC PEPTIDE: Pro B Natriuretic peptide (BNP): 324.6 pg/mL (ref 0–450)

## 2011-10-07 LAB — HEMOGLOBIN
Hemoglobin: 10.6 g/dL — ABNORMAL LOW (ref 13.0–17.0)
Hemoglobin: 10.9 g/dL — ABNORMAL LOW (ref 13.0–17.0)
Hemoglobin: 9.4 g/dL — ABNORMAL LOW (ref 13.0–17.0)

## 2011-10-07 MED ORDER — PREDNISONE 10 MG PO TABS
10.0000 mg | ORAL_TABLET | Freq: Every day | ORAL | Status: DC
  Administered 2011-10-08: 10 mg via ORAL
  Filled 2011-10-07 (×2): qty 1

## 2011-10-07 NOTE — Progress Notes (Signed)
Physical Therapy Treatment Patient Details Name: Joseph [name] MRN: [id number] DOB: [birth year] Today's Date: 10/07/2011  PT Assessment/Plan  PT - Assessment/Plan Comments on Treatment Session: Pt agreeable and motivated to work with therapy. Encouraged daily ambulation with staff PT Plan: Discharge plan remains appropriate;Frequency remains appropriate PT Frequency: Min 3X/week Follow Up Recommendations: Skilled nursing facility Equipment Recommended: Defer to next venue PT Goals  Acute Rehab PT Goals PT Goal: Sit to Stand - Progress: Met PT Goal: Stand to Sit - Progress: Met PT Goal: Ambulate - Progress: Progressing toward goal PT Goal: Perform Home Exercise Program - Progress: Progressing toward goal  PT Treatment Precautions/Restrictions  Precautions Precautions: Fall Precaution Comments: check orthostatics Required Braces or Orthoses: No Restrictions Weight Bearing Restrictions: No Mobility (including Balance) Bed Mobility Supine to Sit: 7: Independent Transfers Sit to Stand: 6: Modified independent (Device/Increase time) Stand to Sit: 6: Modified independent (Device/Increase time) Ambulation/Gait Ambulation/Gait Assistance: 4: Min assist;Other (comment) Ambulation/Gait Assistance Details (indicate cue type and reason): Pt with 2 LOB with turns. Cues for safety withn RW management especially with turning as patient losses balance laterally Ambulation Distance (Feet): 180 Feet Assistive device: Rolling walker Gait Pattern: Step-to pattern;Trunk flexed;Decreased stride length    Exercise  General Exercises - Lower Extremity Hip Flexion/Marching: AROM;Other reps (comment);Both (15.) Mini-Sqauts: AROM;Both;5 reps End of Session PT - End of Session Equipment Utilized During Treatment: Gait belt Activity Tolerance: Patient tolerated treatment well Patient left: in chair;with call bell in reach General Behavior During Session: [facility] for tasks performed Cognition: [facility]  for tasks performed  Joseph [name], Joseph [name] 10/07/2011, 12:19 PM  10/07/2011 Fredrich Birks PTA [phone] pager [phone] office

## 2011-10-07 NOTE — Progress Notes (Signed)
Utilization review completed. Anette Guarneri, RN, BSN   10/07/11

## 2011-10-07 NOTE — Progress Notes (Signed)
Subjective: Patient continues to feel better. Will ambulate today in the hallways. Plan for discharge in a.m. to skilled nursing facility if remains afebrile off antibiotic therapy which is being discontinued today  Objective: Weight change:   Intake/Output Summary (Last 24 hours) at 10/07/11 0822 Last data filed at 10/06/11 1900  Gross per 24 hour  Intake    610 ml  Output   1000 ml  Net   -390 ml   Filed Vitals:   10/06/11 2056 10/06/11 2100 10/07/11 0145 10/07/11 0500  BP:  105/55  109/69  Pulse:  78  83  Temp:  97.7 F (36.5 C)  97.5 F (36.4 C)  TempSrc:      Resp:  20  20  Height:      Weight:      SpO2: 95% 96% 96% 96%   Appearance: Alert, cooperative, no distress, appears stated age  Head: Normocephalic, without obvious abnormality, atraumatic, temples with mild wasting  Neck: Supple, symmetrical  Lungs: Distant breath sounds, cough not congested  Heart: Regular rate and rhythm, S1 and S2 normal, no murmur, rub or gallop  Abdomen: Soft, non-tender, bowel sounds active all four quadrants, no masses, no organomegaly  Extremities: Extremities normal, atraumatic, no cyanosis or edema  Pulses: 2+ and symmetric all extremities  Skin: Skin color, texture, turgor normal, no rashes or lesions  Neuro: CNII-XII intact. Normal strength, sensation and reflexes throughout      Lab Results:  Basename 10/05/11 0447  NA 132*  K 4.6  CL 101  CO2 27  GLUCOSE 47*  BUN 17  CREATININE 0.71  CALCIUM 8.7  MG --  PHOS --   No results found for this basename: AST:2,ALT:2,ALKPHOS:2,BILITOT:2,PROT:2,ALBUMIN:2 in the last 72 hours No results found for this basename: LIPASE:2,AMYLASE:2 in the last 72 hours  Basename 10/07/11 0600 10/06/11 2333 10/05/11 0447  WBC 9.6 -- 10.4  NEUTROABS 6.0 -- 6.7  HGB 10.3* 9.4* --  HCT 29.3* 26.2* --  MCV 91.6 -- 90.5  PLT 218 -- 228   No results found for this basename: CKTOTAL:3,CKMB:3,CKMBINDEX:3,TROPONINI:3 in the last 72 hours No  components found with this basename: POCBNP:3 No results found for this basename: DDIMER:2 in the last 72 hours No results found for this basename: HGBA1C:2 in the last 72 hours No results found for this basename: CHOL:2,HDL:2,LDLCALC:2,TRIG:2,CHOLHDL:2,LDLDIRECT:2 in the last 72 hours No results found for this basename: TSH,T4TOTAL,FREET3,T3FREE,THYROIDAB in the last 72 hours No results found for this basename: VITAMINB12:2,FOLATE:2,FERRITIN:2,TIBC:2,IRON:2,RETICCTPCT:2 in the last 72 hours  Studies/Results: No results found. Medications: Scheduled Meds:   . albuterol  2.5 mg Nebulization Q6H  . aspirin EC  81 mg Oral Daily  . brinzolamide  1 drop Left Eye Daily  . docusate sodium  100 mg Oral BID  . enoxaparin  40 mg Subcutaneous Q24H  . ezetimibe  10 mg Oral Daily  . Flora-Q  1 capsule Oral Daily  . fluconazole  100 mg Oral Daily  . fluticasone  2 puff Inhalation BID  . insulin aspart  0-9 Units Subcutaneous TID WC  . insulin glargine  10 Units Subcutaneous QHS  . ipratropium  0.5 mg Nebulization TID  . metFORMIN  500 mg Oral BID WC  . nystatin  5 mL Oral QID  . predniSONE  10 mg Oral Q breakfast  . Travoprost (BAK Free)  1 drop Both Eyes QHS  . DISCONTD: amoxicillin-clavulanate  1 tablet Oral Q12H  . DISCONTD: azithromycin  500 mg Oral Daily  . DISCONTD: predniSONE  20 mg Oral Q breakfast   Continuous Infusions:   . sodium chloride 50 mL/hr at 10/07/11 0708   PRN Meds:.acetaminophen, albuterol, ALPRAZolam, DISCONTD: acetaminophen, DISCONTD: alum & mag hydroxide-simeth, DISCONTD: ondansetron (ZOFRAN) IV, DISCONTD: ondansetron  Assessment/Plan:  Patient Active Problem List   Diagnoses  Date Noted   .  Hypotension, unspecified - improved 10/03/2011     Class: Acute   .  Chronic respiratory failure - improved  10/03/2011     Class: Chronic   .  Weakness generalized - improving  09/27/2011   .  Pneumonia - discontinue Augmentin and azithromycin today. Monitor for fever  for 24 hours. If afebrile discharge to skilled nursing tomorrow  09/27/2011   .  Diabetes mellitus  09/26/2011   .  Thrush - oral diflucan Day #4 09/26/2011   .  COPD (chronic obstructive pulmonary disease) - nebs and taper prednisone to 10 mg daily  09/19/2011   .  Acute-on-chronic respiratory failure - improved  09/19/2011   .  CAD (coronary artery disease), native coronary artery  09/19/2011   .  Peripheral vascular disease  Disposition  - refer to skilled nursing facility tomorrow for further rehabilitation, 10/08/2011 if remains afebrile off antibiotics today  09/19/2011         LOS: 4 days   Shaynna Husby NEVILL 10/07/2011, 8:22 AM

## 2011-10-08 DIAGNOSIS — F411 Generalized anxiety disorder: Secondary | ICD-10-CM | POA: Diagnosis not present

## 2011-10-08 DIAGNOSIS — J9600 Acute respiratory failure, unspecified whether with hypoxia or hypercapnia: Secondary | ICD-10-CM | POA: Diagnosis not present

## 2011-10-08 DIAGNOSIS — J4489 Other specified chronic obstructive pulmonary disease: Secondary | ICD-10-CM | POA: Diagnosis not present

## 2011-10-08 DIAGNOSIS — I2510 Atherosclerotic heart disease of native coronary artery without angina pectoris: Secondary | ICD-10-CM | POA: Diagnosis not present

## 2011-10-08 DIAGNOSIS — J449 Chronic obstructive pulmonary disease, unspecified: Secondary | ICD-10-CM | POA: Diagnosis not present

## 2011-10-08 DIAGNOSIS — Z5189 Encounter for other specified aftercare: Secondary | ICD-10-CM | POA: Diagnosis not present

## 2011-10-08 DIAGNOSIS — R000 Tachycardia, unspecified: Secondary | ICD-10-CM | POA: Diagnosis not present

## 2011-10-08 DIAGNOSIS — J189 Pneumonia, unspecified organism: Secondary | ICD-10-CM | POA: Diagnosis not present

## 2011-10-08 DIAGNOSIS — R5381 Other malaise: Secondary | ICD-10-CM | POA: Diagnosis not present

## 2011-10-08 DIAGNOSIS — B370 Candidal stomatitis: Secondary | ICD-10-CM | POA: Diagnosis not present

## 2011-10-08 DIAGNOSIS — E119 Type 2 diabetes mellitus without complications: Secondary | ICD-10-CM | POA: Diagnosis not present

## 2011-10-08 DIAGNOSIS — I959 Hypotension, unspecified: Secondary | ICD-10-CM | POA: Diagnosis not present

## 2011-10-08 DIAGNOSIS — E139 Other specified diabetes mellitus without complications: Secondary | ICD-10-CM | POA: Diagnosis not present

## 2011-10-08 DIAGNOSIS — M6281 Muscle weakness (generalized): Secondary | ICD-10-CM | POA: Diagnosis not present

## 2011-10-08 LAB — HEMATOCRIT
HCT: 29.1 % — ABNORMAL LOW (ref 39.0–52.0)
HCT: 31 % — ABNORMAL LOW (ref 39.0–52.0)

## 2011-10-08 LAB — GLUCOSE, CAPILLARY
Glucose-Capillary: 80 mg/dL (ref 70–99)
Glucose-Capillary: 136 mg/dL — ABNORMAL HIGH (ref 70–99)

## 2011-10-08 MED ORDER — LISINOPRIL 2.5 MG PO TABS: 2.5000 mg | ORAL_TABLET | Freq: Every day | ORAL | Status: DC

## 2011-10-08 MED ORDER — FLORA-Q PO CAPS: 1.0000 | ORAL_CAPSULE | Freq: Every day | ORAL | Status: AC

## 2011-10-08 MED ORDER — ALBUTEROL SULFATE (5 MG/ML) 0.5% IN NEBU: 2.5000 mg | INHALATION_SOLUTION | RESPIRATORY_TRACT | Status: DC | PRN

## 2011-10-08 MED ORDER — DSS 100 MG PO CAPS: 100.0000 mg | ORAL_CAPSULE | Freq: Two times a day (BID) | ORAL | Status: AC

## 2011-10-08 MED ORDER — ACETAMINOPHEN 325 MG PO TABS: 650.0000 mg | ORAL_TABLET | Freq: Four times a day (QID) | ORAL | Status: AC | PRN

## 2011-10-08 MED ORDER — ALBUTEROL SULFATE (5 MG/ML) 0.5% IN NEBU: 2.5000 mg | INHALATION_SOLUTION | Freq: Four times a day (QID) | RESPIRATORY_TRACT | Status: DC

## 2011-10-08 MED ORDER — TAMSULOSIN HCL 0.4 MG PO CAPS: 0.4000 mg | ORAL_CAPSULE | Freq: Every day | ORAL | Status: DC

## 2011-10-08 MED ORDER — NYSTATIN 100000 UNIT/ML MT SUSP: 5.0000 mL | Freq: Three times a day (TID) | OROMUCOSAL | Status: AC

## 2011-10-08 MED ORDER — PREDNISONE 10 MG PO TABS: 10.0000 mg | ORAL_TABLET | Freq: Every day | ORAL | Status: DC

## 2011-10-08 NOTE — Discharge Summary (Addendum)
Physician Discharge Summary  NAME:Joseph [name]  JYN:[id number]  DOB: 1930-09-09   Admit date: 10/03/2011 Discharge date: 10/08/2011  Discharge Diagnoses:  Principal Problem: Hypotension, unspecified - resolved. Likely multifactorial including dehydration and medication effect Active Problems: COPD (chronic obstructive pulmonary disease) - continue on prednisone taper and nebulizer therapy after discharge CAD (coronary artery disease), native coronary artery - asymptomatic Diabetes mellitus - relatively well controlled Thrush - symptomatically resolved. Continue nystatin for one more week Pneumonia - resolved Weakness - improved   Discharge Condition: Improved, stable  Hospital Course:    Mr. Bashir Marchetti is a very pleasant 78 year old male who was admitted for approximately 10 days in mid December for bilateral pneumonia and severe COPD exacerbation. He developed thrush during that hospitalization. He was discharged home on December 28 with improved dysphagia and improving strength and absence of significant shortness of breath. He had been discharged to [facility], a skilled nursing facility for physical therapy and strengthening. He felt fine the day after discharge, but on the second day after discharge he became very weak and dizzy and blood pressure was noted to be in the 70s systolic. He was transferred to [facility] emergency room and rehydrated and admitted. After admission, and after rehydration. He has done quite well. Flomax was held and lisinopril was discontinued. We'll continue off lisinopril for now but resume Flomax. After admission his antibiotics were continued and aggressive therapy for COPD was reinstituted. Now tapering off prednisone and continue nebs. He is off antibiotics but will continue medication for oral thrush for one more week. There may have been a component of esophageal candidiasis causing dysphagia. Dysphagia has resolved. Continues to be quite weak and  needs further physical therapy  Consults:  Physical therapy  Disposition: Skilled Nursing Facility - for strengthening prior to discharge home  Discharge Orders    Future Appointments: Provider: Department: Dept Phone: Center:   12/01/2011 8:00 AM [facility] [phone] [facility]   12/01/2011 9:00 AM [facility] [phone] [facility]   12/01/2011 9:30 AM Chuck Hint, MD [facility] [phone] [facility]     Future Orders Please Complete By Expires   Diet - low sodium heart healthy      Increase activity slowly      Comments:   Needs continued physical therapy     Current Discharge Medication List    START taking these medications   Details  acetaminophen (TYLENOL) 325 MG tablet Take 2 tablets (650 mg total) by mouth every 6 (six) hours as needed (or Fever >/= 101). Qty: 30 tablet, Refills: 0    !! albuterol (PROVENTIL) (5 MG/ML) 0.5% nebulizer solution Take 0.5 mLs (2.5 mg total) by nebulization every 6 (six) hours. Qty: 20 mL, Refills: 5    !! albuterol (PROVENTIL) (5 MG/ML) 0.5% nebulizer solution Take 0.5 mLs (2.5 mg total) by nebulization every 2 (two) hours as needed for wheezing. Qty: 20 mL, Refills: 0    docusate sodium 100 MG CAPS Take 100 mg by mouth 2 (two) times daily. Qty: 60 capsule, Refills: 0    Flora-Q (FLORA-Q) CAPS Take 1 capsule by mouth daily. Qty: 30 capsule, Refills: 0     !! - Potential duplicate medications found. Please discuss with provider.    CONTINUE these medications which have CHANGED   Details  lisinopril (PRINIVIL,ZESTRIL) 2.5 MG tablet Take 1 tablet (2.5 mg total) by mouth daily. Qty: 30 tablet, Refills: 0    nystatin (MYCOSTATIN) 100000 UNIT/ML suspension Take 5  mLs (500,000 Units total) by mouth 3 (three) times daily. Qty: 60 mL, Refills: 0    predniSONE (DELTASONE) 10 MG tablet Take 1 tablet (10 mg total) by mouth daily with breakfast. Qty: 10 tablet, Refills: 0    Tamsulosin HCl (FLOMAX)  0.4 MG CAPS Take 1 capsule (0.4 mg total) by mouth daily after supper. Qty: 30 capsule, Refills: 0      CONTINUE these medications which have NOT CHANGED   Details  ALPRAZolam (XANAX) 0.25 MG tablet Take 1 tablet (0.25 mg total) by mouth 3 (three) times daily as needed for anxiety. Qty: 30 tablet, Refills: 0    aspirin EC 81 MG EC tablet Take 1 tablet (81 mg total) by mouth daily. Qty: 1 tablet, Refills: 0    brinzolamide (AZOPT) 1 % ophthalmic suspension Place 1 drop into the left eye every morning.     budesonide (PULMICORT FLEXHALER) 180 MCG/ACT inhaler Inhale 1 puff into the lungs 2 (two) times daily.      dorzolamide (TRUSOPT) 2 % ophthalmic solution Place 1 drop into the left eye daily before breakfast. Qty: 10 mL, Refills: 0    ezetimibe (ZETIA) 10 MG tablet Take 10 mg by mouth daily.     insulin aspart (NOVOLOG) 100 UNIT/ML injection Inject 0-15 Units into the skin 3 (three) times daily with meals. Qty: 1 vial, Refills: 0    insulin glargine (LANTUS) 100 UNIT/ML injection Inject 10 Units into the skin at bedtime. Qty: 10 mL, Refills: 0    ipratropium (ATROVENT) 0.02 % nebulizer solution Take 2.5 mLs (0.5 mg total) by nebulization 3 (three) times daily. Qty: 75 mL, Refills: 0    metFORMIN (GLUCOPHAGE) 500 MG tablet Take 500 mg by mouth 2 (two) times daily with a meal.      travoprost, benzalkonium, (TRAVATAN) 0.004 % ophthalmic solution Place 1 drop into both eyes at bedtime.        Follow-up Information    Follow up with Kimarie Coor NEVILL, MD .         Things to follow up in the outpatient setting: Orthostatic blood pressures, and vital signs every shift. Stability with ambulation, and monitor for return of dysphagia   Time coordinating discharge:  35 minutes   The results of significant diagnostics from this hospitalization (including imaging, microbiology, ancillary and laboratory) are listed below for reference.    Significant Diagnostic Studies: Dg Chest  2 View  09/29/2011  *RADIOLOGY REPORT*  Clinical Data: Pneumonia.  CHEST - 2 VIEW  Comparison: 09/23/2011  Findings: Advanced COPD with pulmonary hyperinflation and pulmonary scarring.  Negative for pneumonia.  Negative for heart failure. Improved aeration compared with the prior study.  IMPRESSION: COPD.  No acute cardiopulmonary disease.  Original Report Authenticated By: Camelia Phenes, M.D.   Dg Chest Portable 1 View  10/03/2011  *RADIOLOGY REPORT*  Clinical Data: Hypotension, recent pneumonia  PORTABLE CHEST - 1 VIEW  Comparison: 09/29/2011  Findings: Cardiomediastinal silhouette is stable.  Stable hyperinflation and chronic interstitial prominence. No pulmonary edema.  Streaky bilateral basilar atelectasis or early infiltrate.  IMPRESSION: Hyperinflation and chronic interstitial prominence without convincing pulmonary edema.  Streaky bilateral basilar atelectasis or early infiltrate.  Original Report Authenticated By: Natasha Mead, M.D.   Dg Chest Port 1 View  09/23/2011  *RADIOLOGY REPORT*  Clinical Data: Intubated  PORTABLE CHEST - 1 VIEW  Comparison: 09/21/2011  Findings: An endotracheal tube is not visualized. Hyperinflation with interstitial prominence, similar to prior. Improved aeration of the right lower lobe  opacity.  Stable cardiomediastinal contours.  No pneumothorax or pleural effusion.  No acute osseous abnormality.  IMPRESSION:  No endotracheal tube visualized.  Discussed with the patient's nurse, Dawn, at 07:05 a.m. on 09/23/2011.  The patient is not intubated.  Improved aeration of the right lower lobe opacity.  Otherwise no significant interval change.  COPD.  Original Report Authenticated By: Waneta Martins, M.D.   Dg Chest Port 1 View  09/21/2011  *RADIOLOGY REPORT*  Clinical Data: Infiltrates.  PORTABLE CHEST - 1 VIEW  Comparison: 09/20/2011  Findings: There is hyperinflation of the lungs compatible with COPD.  Heart is normal size.  The right basilar atelectasis or  infiltrate, slightly increased since prior study.  Left lung is clear.  No effusions or acute bony abnormality.  IMPRESSION: COPD.  Right lower lobe atelectasis or infiltrate, increased since prior study.  Original Report Authenticated By: Cyndie Chime, M.D.   Portable Chest 1 View  09/20/2011  *RADIOLOGY REPORT*  Clinical Data: COPD  PORTABLE CHEST - 1 VIEW  Comparison: 09/19/2011; 10/15/2010  Findings: Grossly unchanged cardiac silhouette and mediastinal contours to the lordotic projection.  The lungs remain hyperinflated. Improved aeration of the right lower lung.  Minimal left basilar linear heterogeneous opacities favored to represent subsegmental atelectasis or scar.  No focal airspace opacities.  No definite pleural effusion or pneumothorax.  Grossly unchanged bones.  IMPRESSION: 1.  Improved aeration of the right base suggests resolving atelectasis.  2.  Hyperexpanded lungs suggestive of emphysema.  Original Report Authenticated By: Waynard Reeds, M.D.   Dg Chest Port 1 View  09/19/2011  *RADIOLOGY REPORT*  Clinical Data: Shortness of breath.  Difficulty breathing.  PORTABLE CHEST - 1 VIEW  Comparison: 10/15/2010  Findings: The lungs are hyperinflated.  There are the [facility] changes especially at the apices.  Heart size is normal.  Crowded markings are identified at the bases. There is question of asymmetric infiltrate at the right lung base versus chronic changes.  Degenerative changes are seen in the spine.  IMPRESSION:  1.  Marked emphysema. 2. Question of superimposed right lower lobe infiltrate.  Original Report Authenticated By: Patterson Hammersmith, M.D.    Microbiology: No results found for this or any previous visit (from the past 240 hour(s)).   Labs: Results for orders placed during the hospital encounter of 10/03/11  CBC      Component Value Range   WBC 12.4 (*) 4.0 - 10.5 (K/uL)   RBC 3.17 (*) 4.22 - 5.81 (MIL/uL)   Hemoglobin 10.3 (*) 13.0 - 17.0 (g/dL)   HCT 91.4 (*)  78.2 - 52.0 (%)   MCV 90.5  78.0 - 100.0 (fL)   MCH 32.5  26.0 - 34.0 (pg)   MCHC 35.9  30.0 - 36.0 (g/dL)   RDW 95.6  21.3 - 08.6 (%)   Platelets 218  150 - 400 (K/uL)  DIFFERENTIAL      Component Value Range   Neutrophils Relative 77  43 - 77 (%)   Lymphocytes Relative 13  12 - 46 (%)   Monocytes Relative 9  3 - 12 (%)   Eosinophils Relative 1  0 - 5 (%)   Basophils Relative 0  0 - 1 (%)   Neutro Abs 9.6 (*) 1.7 - 7.7 (K/uL)   Lymphs Abs 1.6  0.7 - 4.0 (K/uL)   Monocytes Absolute 1.1 (*) 0.1 - 1.0 (K/uL)   Eosinophils Absolute 0.1  0.0 - 0.7 (K/uL)   Basophils Absolute 0.0  0.0 - 0.1 (K/uL)   RBC Morphology POLYCHROMASIA PRESENT    COMPREHENSIVE METABOLIC PANEL      Component Value Range   Sodium 125 (*) 135 - 145 (mEq/L)   Potassium 4.6  3.5 - 5.1 (mEq/L)   Chloride 93 (*) 96 - 112 (mEq/L)   CO2 22  19 - 32 (mEq/L)   Glucose, Bld 165 (*) 70 - 99 (mg/dL)   BUN 42 (*) 6 - 23 (mg/dL)   Creatinine, Ser 9.60  0.50 - 1.35 (mg/dL)   Calcium 8.7  8.4 - 45.4 (mg/dL)   Total Protein 4.7 (*) 6.0 - 8.3 (g/dL)   Albumin 2.4 (*) 3.5 - 5.2 (g/dL)   AST 12  0 - 37 (U/L)   ALT 15  0 - 53 (U/L)   Alkaline Phosphatase 44  39 - 117 (U/L)   Total Bilirubin 0.2 (*) 0.3 - 1.2 (mg/dL)   GFR calc non Af Amer 76 (*) >90 (mL/min)   GFR calc Af Amer 88 (*) >90 (mL/min)  CARDIAC PANEL(CRET KIN+CKTOT+MB+TROPI)      Component Value Range   Total CK 16  7 - 232 (U/L)   CK, MB 2.1  0.3 - 4.0 (ng/mL)   Troponin I <0.30  <0.30 (ng/mL)   Relative Index RELATIVE INDEX IS INVALID  0.0 - 2.5   CBC      Component Value Range   WBC 12.1 (*) 4.0 - 10.5 (K/uL)   RBC 3.10 (*) 4.22 - 5.81 (MIL/uL)   Hemoglobin 9.8 (*) 13.0 - 17.0 (g/dL)   HCT 09.8 (*) 11.9 - 52.0 (%)   MCV 90.0  78.0 - 100.0 (fL)   MCH 31.6  26.0 - 34.0 (pg)   MCHC 35.1  30.0 - 36.0 (g/dL)   RDW 14.7  82.9 - 56.2 (%)   Platelets 244  150 - 400 (K/uL)  CREATININE, SERUM      Component Value Range   Creatinine, Ser 0.80  0.50 - 1.35  (mg/dL)   GFR calc non Af Amer 83 (*) >90 (mL/min)   GFR calc Af Amer >90  >90 (mL/min)  COMPREHENSIVE METABOLIC PANEL      Component Value Range   Sodium 131 (*) 135 - 145 (mEq/L)   Potassium 4.3  3.5 - 5.1 (mEq/L)   Chloride 100  96 - 112 (mEq/L)   CO2 25  19 - 32 (mEq/L)   Glucose, Bld 60 (*) 70 - 99 (mg/dL)   BUN 24 (*) 6 - 23 (mg/dL)   Creatinine, Ser 1.30  0.50 - 1.35 (mg/dL)   Calcium 8.3 (*) 8.4 - 10.5 (mg/dL)   Total Protein 4.6 (*) 6.0 - 8.3 (g/dL)   Albumin 2.4 (*) 3.5 - 5.2 (g/dL)   AST 11  0 - 37 (U/L)   ALT 14  0 - 53 (U/L)   Alkaline Phosphatase 43  39 - 117 (U/L)   Total Bilirubin 0.4  0.3 - 1.2 (mg/dL)   GFR calc non Af Amer 85 (*) >90 (mL/min)   GFR calc Af Amer >90  >90 (mL/min)  MAGNESIUM      Component Value Range   Magnesium 1.9  1.5 - 2.5 (mg/dL)  URINALYSIS, ROUTINE W REFLEX MICROSCOPIC      Component Value Range   Color, Urine YELLOW  YELLOW    APPearance CLEAR  CLEAR    Specific Gravity, Urine 1.010  1.005 - 1.030    pH 6.5  5.0 - 8.0    Glucose, UA 100 (*)  NEGATIVE (mg/dL)   Hgb urine dipstick TRACE (*) NEGATIVE    Bilirubin Urine NEGATIVE  NEGATIVE    Ketones, ur NEGATIVE  NEGATIVE (mg/dL)   Protein, ur NEGATIVE  NEGATIVE (mg/dL)   Urobilinogen, UA 0.2  0.0 - 1.0 (mg/dL)   Nitrite NEGATIVE  NEGATIVE    Leukocytes, UA TRACE (*) NEGATIVE   CBC      Component Value Range   WBC 9.9  4.0 - 10.5 (K/uL)   RBC 2.97 (*) 4.22 - 5.81 (MIL/uL)   Hemoglobin 9.6 (*) 13.0 - 17.0 (g/dL)   HCT 16.1 (*) 09.6 - 52.0 (%)   MCV 90.6  78.0 - 100.0 (fL)   MCH 32.3  26.0 - 34.0 (pg)   MCHC 35.7  30.0 - 36.0 (g/dL)   RDW 04.5  40.9 - 81.1 (%)   Platelets 211  150 - 400 (K/uL)  DIFFERENTIAL      Component Value Range   Neutrophils Relative 70  43 - 77 (%)   Lymphocytes Relative 21  12 - 46 (%)   Monocytes Relative 8  3 - 12 (%)   Eosinophils Relative 1  0 - 5 (%)   Basophils Relative 0  0 - 1 (%)   Neutro Abs 6.9  1.7 - 7.7 (K/uL)   Lymphs Abs 2.1  0.7 - 4.0  (K/uL)   Monocytes Absolute 0.8  0.1 - 1.0 (K/uL)   Eosinophils Absolute 0.1  0.0 - 0.7 (K/uL)   Basophils Absolute 0.0  0.0 - 0.1 (K/uL)   Smear Review MORPHOLOGY UNREMARKABLE    PROCALCITONIN      Component Value Range   Procalcitonin <0.10    LACTIC ACID, PLASMA      Component Value Range   Lactic Acid, Venous 3.5 (*) 0.5 - 2.2 (mmol/L)  GLUCOSE, CAPILLARY      Component Value Range   Glucose-Capillary 175 (*) 70 - 99 (mg/dL)   Comment 1 Documented in Chart     Comment 2 Notify RN    HEMOGLOBIN      Component Value Range   Hemoglobin 9.7 (*) 13.0 - 17.0 (g/dL)  HEMATOCRIT      Component Value Range   HCT 27.6 (*) 39.0 - 52.0 (%)  HEMOGLOBIN      Component Value Range   Hemoglobin 10.4 (*) 13.0 - 17.0 (g/dL)  HEMOGLOBIN      Component Value Range   Hemoglobin 9.2 (*) 13.0 - 17.0 (g/dL)  HEMATOCRIT      Component Value Range   HCT 29.0 (*) 39.0 - 52.0 (%)  HEMATOCRIT      Component Value Range   HCT 26.0 (*) 39.0 - 52.0 (%)  URINE MICROSCOPIC-ADD ON      Component Value Range   Squamous Epithelial / LPF RARE  RARE    WBC, UA 0-2  <3 (WBC/hpf)   RBC / HPF 0-2  <3 (RBC/hpf)   Bacteria, UA RARE  RARE   GLUCOSE, CAPILLARY      Component Value Range   Glucose-Capillary 59 (*) 70 - 99 (mg/dL)   Comment 1 Documented in Chart     Comment 2 Notify RN    GLUCOSE, CAPILLARY      Component Value Range   Glucose-Capillary 135 (*) 70 - 99 (mg/dL)   Comment 1 Documented in Chart     Comment 2 Notify RN    GLUCOSE, CAPILLARY      Component Value Range   Glucose-Capillary 154 (*) 70 - 99 (  mg/dL)   Comment 1 Documented in Chart     Comment 2 Notify RN    CBC      Component Value Range   WBC 10.4  4.0 - 10.5 (K/uL)   RBC 3.06 (*) 4.22 - 5.81 (MIL/uL)   Hemoglobin 9.7 (*) 13.0 - 17.0 (g/dL)   HCT 04.5 (*) 40.9 - 52.0 (%)   MCV 90.5  78.0 - 100.0 (fL)   MCH 31.7  26.0 - 34.0 (pg)   MCHC 35.0  30.0 - 36.0 (g/dL)   RDW 81.1  91.4 - 78.2 (%)   Platelets 228  150 - 400 (K/uL)   DIFFERENTIAL      Component Value Range   Neutrophils Relative 65  43 - 77 (%)   Neutro Abs 6.7  1.7 - 7.7 (K/uL)   Lymphocytes Relative 24  12 - 46 (%)   Lymphs Abs 2.5  0.7 - 4.0 (K/uL)   Monocytes Relative 10  3 - 12 (%)   Monocytes Absolute 1.1 (*) 0.1 - 1.0 (K/uL)   Eosinophils Relative 1  0 - 5 (%)   Eosinophils Absolute 0.1  0.0 - 0.7 (K/uL)   Basophils Relative 0  0 - 1 (%)   Basophils Absolute 0.0  0.0 - 0.1 (K/uL)  BASIC METABOLIC PANEL      Component Value Range   Sodium 132 (*) 135 - 145 (mEq/L)   Potassium 4.6  3.5 - 5.1 (mEq/L)   Chloride 101  96 - 112 (mEq/L)   CO2 27  19 - 32 (mEq/L)   Glucose, Bld 47 (*) 70 - 99 (mg/dL)   BUN 17  6 - 23 (mg/dL)   Creatinine, Ser 9.56  0.50 - 1.35 (mg/dL)   Calcium 8.7  8.4 - 21.3 (mg/dL)   GFR calc non Af Amer 87 (*) >90 (mL/min)   GFR calc Af Amer >90  >90 (mL/min)  PRO B NATRIURETIC PEPTIDE      Component Value Range   Pro B Natriuretic peptide (BNP) 263.3  0 - 450 (pg/mL)  GLUCOSE, CAPILLARY      Component Value Range   Glucose-Capillary 226 (*) 70 - 99 (mg/dL)   Comment 1 Documented in Chart     Comment 2 Notify RN    GLUCOSE, CAPILLARY      Component Value Range   Glucose-Capillary 142 (*) 70 - 99 (mg/dL)   Comment 1 Documented in Chart     Comment 2 Notify RN    HEMOGLOBIN      Component Value Range   Hemoglobin 10.1 (*) 13.0 - 17.0 (g/dL)  HEMATOCRIT      Component Value Range   HCT 28.4 (*) 39.0 - 52.0 (%)  HEMOGLOBIN      Component Value Range   Hemoglobin 9.8 (*) 13.0 - 17.0 (g/dL)  HEMOGLOBIN      Component Value Range   Hemoglobin 8.9 (*) 13.0 - 17.0 (g/dL)  HEMATOCRIT      Component Value Range   HCT 28.2 (*) 39.0 - 52.0 (%)  HEMATOCRIT      Component Value Range   HCT 25.8 (*) 39.0 - 52.0 (%)  GLUCOSE, CAPILLARY      Component Value Range   Glucose-Capillary 55 (*) 70 - 99 (mg/dL)   Comment 1 Documented in Chart     Comment 2 Notify RN    GLUCOSE, CAPILLARY      Component Value Range    Glucose-Capillary 108 (*) 70 - 99 (mg/dL)  GLUCOSE, CAPILLARY      Component Value Range   Glucose-Capillary 133 (*) 70 - 99 (mg/dL)  GLUCOSE, CAPILLARY      Component Value Range   Glucose-Capillary 168 (*) 70 - 99 (mg/dL)  GLUCOSE, CAPILLARY      Component Value Range   Glucose-Capillary 190 (*) 70 - 99 (mg/dL)  GLUCOSE, CAPILLARY      Component Value Range   Glucose-Capillary 212 (*) 70 - 99 (mg/dL)  GLUCOSE, CAPILLARY      Component Value Range   Glucose-Capillary 270 (*) 70 - 99 (mg/dL)   Comment 1 Notify RN     Comment 2 Documented in Chart    HEMOGLOBIN      Component Value Range   Hemoglobin 10.0 (*) 13.0 - 17.0 (g/dL)  HEMATOCRIT      Component Value Range   HCT 28.7 (*) 39.0 - 52.0 (%)  GLUCOSE, CAPILLARY      Component Value Range   Glucose-Capillary 141 (*) 70 - 99 (mg/dL)   Comment 1 Notify RN     Comment 2 Documented in Chart    HEMOGLOBIN      Component Value Range   Hemoglobin 10.3 (*) 13.0 - 17.0 (g/dL)  HEMATOCRIT      Component Value Range   HCT 29.1 (*) 39.0 - 52.0 (%)  HEMOGLOBIN      Component Value Range   Hemoglobin 9.3 (*) 13.0 - 17.0 (g/dL)  HEMOGLOBIN      Component Value Range   Hemoglobin 9.4 (*) 13.0 - 17.0 (g/dL)  HEMATOCRIT      Component Value Range   HCT 26.6 (*) 39.0 - 52.0 (%)  HEMATOCRIT      Component Value Range   HCT 26.2 (*) 39.0 - 52.0 (%)  GLUCOSE, CAPILLARY      Component Value Range   Glucose-Capillary 76  70 - 99 (mg/dL)   Comment 1 Notify RN     Comment 2 Documented in Chart    GLUCOSE, CAPILLARY      Component Value Range   Glucose-Capillary 181 (*) 70 - 99 (mg/dL)   Comment 1 Notify RN     Comment 2 Documented in Chart    BASIC METABOLIC PANEL      Component Value Range   Sodium 132 (*) 135 - 145 (mEq/L)   Potassium 4.1  3.5 - 5.1 (mEq/L)   Chloride 97  96 - 112 (mEq/L)   CO2 24  19 - 32 (mEq/L)   Glucose, Bld 78  70 - 99 (mg/dL)   BUN 11  6 - 23 (mg/dL)   Creatinine, Ser 1.61  0.50 - 1.35 (mg/dL)    Calcium 8.7  8.4 - 10.5 (mg/dL)   GFR calc non Af Amer >90  >90 (mL/min)   GFR calc Af Amer >90  >90 (mL/min)  CBC      Component Value Range   WBC 9.6  4.0 - 10.5 (K/uL)   RBC 3.20 (*) 4.22 - 5.81 (MIL/uL)   Hemoglobin 10.3 (*) 13.0 - 17.0 (g/dL)   HCT 09.6 (*) 04.5 - 52.0 (%)   MCV 91.6  78.0 - 100.0 (fL)   MCH 32.2  26.0 - 34.0 (pg)   MCHC 35.2  30.0 - 36.0 (g/dL)   RDW 40.9  81.1 - 91.4 (%)   Platelets 218  150 - 400 (K/uL)  DIFFERENTIAL      Component Value Range   Neutrophils Relative 63  43 - 77 (%)  Neutro Abs 6.0  1.7 - 7.7 (K/uL)   Lymphocytes Relative 24  12 - 46 (%)   Lymphs Abs 2.3  0.7 - 4.0 (K/uL)   Monocytes Relative 12  3 - 12 (%)   Monocytes Absolute 1.1 (*) 0.1 - 1.0 (K/uL)   Eosinophils Relative 2  0 - 5 (%)   Eosinophils Absolute 0.2  0.0 - 0.7 (K/uL)   Basophils Relative 0  0 - 1 (%)   Basophils Absolute 0.0  0.0 - 0.1 (K/uL)  PRO B NATRIURETIC PEPTIDE      Component Value Range   Pro B Natriuretic peptide (BNP) 324.6  0 - 450 (pg/mL)  GLUCOSE, CAPILLARY      Component Value Range   Glucose-Capillary 250 (*) 70 - 99 (mg/dL)   Comment 1 Notify RN    GLUCOSE, CAPILLARY      Component Value Range   Glucose-Capillary 150 (*) 70 - 99 (mg/dL)  HEMOGLOBIN      Component Value Range   Hemoglobin 10.6 (*) 13.0 - 17.0 (g/dL)  HEMATOCRIT      Component Value Range   HCT 29.9 (*) 39.0 - 52.0 (%)  HEMOGLOBIN      Component Value Range   Hemoglobin 9.7 (*) 13.0 - 17.0 (g/dL)  HEMOGLOBIN      Component Value Range   Hemoglobin 10.9 (*) 13.0 - 17.0 (g/dL)  HEMATOCRIT      Component Value Range   HCT 28.1 (*) 39.0 - 52.0 (%)  HEMATOCRIT      Component Value Range   HCT 30.9 (*) 39.0 - 52.0 (%)  GLUCOSE, CAPILLARY      Component Value Range   Glucose-Capillary 75  70 - 99 (mg/dL)  GLUCOSE, CAPILLARY      Component Value Range   Glucose-Capillary 85  70 - 99 (mg/dL)   Comment 1 Notify RN    GLUCOSE, CAPILLARY      Component Value Range    Glucose-Capillary 144 (*) 70 - 99 (mg/dL)   Comment 1 Notify RN    GLUCOSE, CAPILLARY      Component Value Range   Glucose-Capillary 179 (*) 70 - 99 (mg/dL)   Comment 1 Notify RN    HEMOGLOBIN      Component Value Range   Hemoglobin 10.1 (*) 13.0 - 17.0 (g/dL)  HEMATOCRIT      Component Value Range   HCT 29.1 (*) 39.0 - 52.0 (%)  GLUCOSE, CAPILLARY      Component Value Range   Glucose-Capillary 129 (*) 70 - 99 (mg/dL)   Comment 1 Notify RN    GLUCOSE, CAPILLARY      Component Value Range   Glucose-Capillary 80  70 - 99 (mg/dL)     Signed: Ronnette Rump NEVILL 10/08/2011, 7:46 AM    The patient felt to be hypotensive secondary to dehydration and medications but without sepsis at admission after further chart review

## 2011-10-08 NOTE — Progress Notes (Signed)
LATE ENTRY: Mr. Joseph [name] came to hospital from [facility], where he was receiving short-term rehab services. Patient is medically stable for discharge and is returning to the facility to continue his rehab.  FL-2 completed, signed by MD and forwarded to facility along with other discharge information. The patient was transported by ambulance to [facility].  Genelle Bal, MSW, LCSW [phone]

## 2011-10-15 DIAGNOSIS — I959 Hypotension, unspecified: Secondary | ICD-10-CM | POA: Diagnosis not present

## 2011-10-15 DIAGNOSIS — E139 Other specified diabetes mellitus without complications: Secondary | ICD-10-CM | POA: Diagnosis not present

## 2011-10-15 DIAGNOSIS — I2510 Atherosclerotic heart disease of native coronary artery without angina pectoris: Secondary | ICD-10-CM | POA: Diagnosis not present

## 2011-10-15 DIAGNOSIS — M6281 Muscle weakness (generalized): Secondary | ICD-10-CM | POA: Diagnosis not present

## 2011-10-15 DIAGNOSIS — J449 Chronic obstructive pulmonary disease, unspecified: Secondary | ICD-10-CM | POA: Diagnosis not present

## 2011-10-18 NOTE — Clinical Documentation Improvement (Signed)
The patient felt to be hypotensive secondary to dehydration and medications and not from sepsis on readmission to hospital

## 2011-10-20 DIAGNOSIS — E139 Other specified diabetes mellitus without complications: Secondary | ICD-10-CM | POA: Diagnosis not present

## 2011-10-20 DIAGNOSIS — R000 Tachycardia, unspecified: Secondary | ICD-10-CM | POA: Diagnosis not present

## 2011-10-20 DIAGNOSIS — J449 Chronic obstructive pulmonary disease, unspecified: Secondary | ICD-10-CM | POA: Diagnosis not present

## 2011-10-20 DIAGNOSIS — F411 Generalized anxiety disorder: Secondary | ICD-10-CM | POA: Diagnosis not present

## 2011-10-20 DIAGNOSIS — I959 Hypotension, unspecified: Secondary | ICD-10-CM | POA: Diagnosis not present

## 2011-10-28 DIAGNOSIS — E119 Type 2 diabetes mellitus without complications: Secondary | ICD-10-CM | POA: Diagnosis not present

## 2011-10-28 DIAGNOSIS — J449 Chronic obstructive pulmonary disease, unspecified: Secondary | ICD-10-CM | POA: Diagnosis not present

## 2011-10-28 DIAGNOSIS — J189 Pneumonia, unspecified organism: Secondary | ICD-10-CM | POA: Diagnosis not present

## 2011-11-30 ENCOUNTER — Encounter: Payer: Self-pay | Admitting: Vascular Surgery

## 2011-12-01 ENCOUNTER — Ambulatory Visit: Payer: Medicare Other | Admitting: Vascular Surgery

## 2011-12-01 ENCOUNTER — Other Ambulatory Visit: Payer: Medicare Other

## 2011-12-01 ENCOUNTER — Ambulatory Visit (INDEPENDENT_AMBULATORY_CARE_PROVIDER_SITE_OTHER): Payer: Medicare Other | Admitting: Vascular Surgery

## 2011-12-01 ENCOUNTER — Encounter: Payer: Self-pay | Admitting: Vascular Surgery

## 2011-12-01 ENCOUNTER — Encounter (INDEPENDENT_AMBULATORY_CARE_PROVIDER_SITE_OTHER): Payer: Medicare Other | Admitting: Vascular Surgery

## 2011-12-01 VITALS — BP 122/69 | HR 83 | Resp 20 | Ht 71.0 in | Wt 137.0 lb

## 2011-12-01 DIAGNOSIS — Z48812 Encounter for surgical aftercare following surgery on the circulatory system: Secondary | ICD-10-CM | POA: Diagnosis not present

## 2011-12-01 DIAGNOSIS — I714 Abdominal aortic aneurysm, without rupture, unspecified: Secondary | ICD-10-CM | POA: Insufficient documentation

## 2011-12-01 DIAGNOSIS — Z8679 Personal history of other diseases of the circulatory system: Secondary | ICD-10-CM

## 2011-12-01 NOTE — Progress Notes (Signed)
Vascular and Vein Specialist of [facility]  Patient name: Joseph [name] MRN: [id number] DOB: 08/26/1930 Sex: male  REASON FOR VISIT: F/U EVAR  HPI: Joseph [name] is a 78 y.o. male who underwent percutaneous endovascular repair of an abdominal aortic aneurysm on 10/15/2010. This was 485.6 cm infrarenal abdominal aortic aneurysm. He comes in for a yearly follow up visit. He was last seen on 11/11/2010. His CT scan at that time showed good position of the stent graft without evidence of endoleak. The aneurysm was 5.3 cm in maximum diameter. He's had no abdominal pain or back pain since I saw him last. He has had some problems with infection in his lungs and was in the hospital for close to a month.  Past Medical History  Diagnosis Date  . Diabetes mellitus   . Hyperlipidemia   . Myocardial infarction   . COPD (chronic obstructive pulmonary disease)   . Emphysema of lung   . Abdominal aneurysm without mention of rupture   . Thyroid disease     Hyperthyroidism, Goiter  . Hypotension     History reviewed. No pertinent family history.  SOCIAL HISTORY: History  Substance Use Topics  . Smoking status: Former Smoker -- 1.5 packs/day    Types: Cigarettes    Quit date: 10/04/2004  . Smokeless tobacco: Never Used  . Alcohol Use: No    Allergies  Allergen Reactions  . Other Other (See Comments)    Says that antibiotics made his mouth sore   . Statins Other (See Comments)    Body aches    Current Outpatient Prescriptions  Medication Sig Dispense Refill  . aspirin 81 MG EC tablet Take 81 mg by mouth 2 (two) times daily.      . brinzolamide (AZOPT) 1 % ophthalmic suspension Place 1 drop into the left eye every morning.       . budesonide (PULMICORT FLEXHALER) 180 MCG/ACT inhaler Inhale 1 puff into the lungs 2 (two) times daily.        Marland Kitchen ezetimibe (ZETIA) 10 MG tablet Take 10 mg by mouth daily.       . metFORMIN (GLUCOPHAGE) 500 MG tablet Take 500 mg by mouth 2 (two) times daily  with a meal.        . travoprost, benzalkonium, (TRAVATAN) 0.004 % ophthalmic solution Place 1 drop into both eyes at bedtime.       Marland Kitchen albuterol (PROVENTIL) (5 MG/ML) 0.5% nebulizer solution Take 0.5 mLs (2.5 mg total) by nebulization every 6 (six) hours.  20 mL  5  . albuterol (PROVENTIL) (5 MG/ML) 0.5% nebulizer solution Take 0.5 mLs (2.5 mg total) by nebulization every 2 (two) hours as needed for wheezing.  20 mL  0  . dorzolamide (TRUSOPT) 2 % ophthalmic solution Place 1 drop into the left eye daily before breakfast.  10 mL  0  . insulin aspart (NOVOLOG) 100 UNIT/ML injection Inject 0-15 Units into the skin 3 (three) times daily with meals.  1 vial  0  . insulin glargine (LANTUS) 100 UNIT/ML injection Inject 10 Units into the skin at bedtime.  10 mL  0  . ipratropium (ATROVENT) 0.02 % nebulizer solution Take 2.5 mLs (0.5 mg total) by nebulization 3 (three) times daily.  75 mL  0  . predniSONE (DELTASONE) 10 MG tablet Take 1 tablet (10 mg total) by mouth daily with breakfast.  10 tablet  0    REVIEW OF SYSTEMS: Arly.Keller ] denotes positive finding; [  ] denotes negative  finding  CARDIOVASCULAR:  [ ]  chest pain   [ ]  chest pressure   [ ]  palpitations   [ ]  orthopnea   [ ]  dyspnea on exertion   [ ]  claudication   [ ]  rest pain   [ ]  DVT   [ ]  phlebitis PULMONARY:   [ ]  productive cough   Arly.Keller ] asthma   [ ]  wheezing NEUROLOGIC:   [ ]  weakness  [ ]  paresthesias  [ ]  aphasia  [ ]  amaurosis  [ ]  dizziness HEMATOLOGIC:   [ ]  bleeding problems   [ ]  clotting disorders MUSCULOSKELETAL:  [ ]  joint pain   [ ]  joint swelling [ ]  leg swelling GASTROINTESTINAL: [ ]   blood in stool  [ ]   hematemesis GENITOURINARY:  [ ]   dysuria  [ ]   hematuria PSYCHIATRIC:  [ ]  history of major depression INTEGUMENTARY:  [ ]  rashes  [ ]  ulcers CONSTITUTIONAL:  [ ]  fever   [ ]  chills  PHYSICAL EXAM: Filed Vitals:   12/01/11 0934  BP: 122/69  Pulse: 83  Resp: 20  Height: 5\' 11"  (1.803 m)  Weight: 137 lb (62.143 kg)   Body  mass index is 19.11 kg/(m^2). GENERAL: The patient is a well-nourished male, in no acute distress. The vital signs are documented above. CARDIOVASCULAR: There is a regular rate and rhythm without significant murmur appreciated. I do not detect carotid bruits. He has palpable femoral pulses and palpable pedal pulses bilaterally. He has no significant lower extremity swelling PULMONARY: There is good air exchange bilaterally without wheezing or rales. ABDOMEN: Soft and non-tender with normal pitched bowel sounds.  MUSCULOSKELETAL: There are no major deformities or cyanosis. NEUROLOGIC: No focal weakness or paresthesias are detected. SKIN: There are no ulcers or rashes noted. PSYCHIATRIC: The patient has a normal affect.  DATA:  I have independently interpreted his duplex of his EVAR. This shows no evidence of endoleak. He aneurysm has continued to decrease in size and is now 4.4 cm in maximum diameter.  MEDICAL ISSUES: Given that the aneurysm continues to shrink in size I think we continue his follow up at one year. I've ordered a follow duplex scan in 1 year and I'll see him back at that time. He knows to call sooner if he has problems.  Natallia Stellmach S [facility] [place] [facility]per: [phone]

## 2011-12-08 NOTE — Procedures (Unsigned)
VASCULAR LAB EXAM  INDICATION:  Follow up AAA endograft placed 10/15/2010.  HISTORY: Diabetes:  Yes. Cardiac:  CAD, MI. Hypertension:  Yes. Smoking:  Previous.  EXAM:   AAA Sac Size:  3.59 CM AP, 4.36 cm TRV.  Previous Sac Size: CT 10/15/2010:  ___ CM AP,  5.3  cm TRV.  IMPRESSION: 1. The aorta and endograft appear patent. 2. Decrease in size of the aneurysmal sac surrounding the endograft. 3. No evidence of endoleak was detected.  ___________________________________________ Di Kindle. Edilia Bo, M.D.  SH/MEDQ  D:  12/01/2011  T:  12/01/2011  Job:  [id number]

## 2011-12-14 DIAGNOSIS — E0520 Thyrotoxicosis with toxic multinodular goiter without thyrotoxic crisis or storm: Secondary | ICD-10-CM | POA: Diagnosis not present

## 2011-12-15 DIAGNOSIS — H4011X0 Primary open-angle glaucoma, stage unspecified: Secondary | ICD-10-CM | POA: Diagnosis not present

## 2011-12-15 DIAGNOSIS — H409 Unspecified glaucoma: Secondary | ICD-10-CM | POA: Diagnosis not present

## 2011-12-16 DIAGNOSIS — E0520 Thyrotoxicosis with toxic multinodular goiter without thyrotoxic crisis or storm: Secondary | ICD-10-CM | POA: Diagnosis not present

## 2012-01-12 DIAGNOSIS — N401 Enlarged prostate with lower urinary tract symptoms: Secondary | ICD-10-CM | POA: Diagnosis not present

## 2012-01-12 DIAGNOSIS — R351 Nocturia: Secondary | ICD-10-CM | POA: Diagnosis not present

## 2012-01-24 DIAGNOSIS — E782 Mixed hyperlipidemia: Secondary | ICD-10-CM | POA: Diagnosis not present

## 2012-01-25 DIAGNOSIS — J449 Chronic obstructive pulmonary disease, unspecified: Secondary | ICD-10-CM | POA: Diagnosis not present

## 2012-01-25 DIAGNOSIS — J309 Allergic rhinitis, unspecified: Secondary | ICD-10-CM | POA: Diagnosis not present

## 2012-01-25 DIAGNOSIS — E559 Vitamin D deficiency, unspecified: Secondary | ICD-10-CM | POA: Diagnosis not present

## 2012-01-25 DIAGNOSIS — E0520 Thyrotoxicosis with toxic multinodular goiter without thyrotoxic crisis or storm: Secondary | ICD-10-CM | POA: Diagnosis not present

## 2012-01-25 DIAGNOSIS — I10 Essential (primary) hypertension: Secondary | ICD-10-CM | POA: Diagnosis not present

## 2012-01-25 DIAGNOSIS — E782 Mixed hyperlipidemia: Secondary | ICD-10-CM | POA: Diagnosis not present

## 2012-01-25 DIAGNOSIS — Z79899 Other long term (current) drug therapy: Secondary | ICD-10-CM | POA: Diagnosis not present

## 2012-02-11 DIAGNOSIS — R3129 Other microscopic hematuria: Secondary | ICD-10-CM | POA: Diagnosis not present

## 2012-04-12 DIAGNOSIS — H4011X0 Primary open-angle glaucoma, stage unspecified: Secondary | ICD-10-CM | POA: Diagnosis not present

## 2012-04-12 DIAGNOSIS — H409 Unspecified glaucoma: Secondary | ICD-10-CM | POA: Diagnosis not present

## 2012-04-25 DIAGNOSIS — J449 Chronic obstructive pulmonary disease, unspecified: Secondary | ICD-10-CM | POA: Diagnosis not present

## 2012-04-25 DIAGNOSIS — I10 Essential (primary) hypertension: Secondary | ICD-10-CM | POA: Diagnosis not present

## 2012-04-25 DIAGNOSIS — E782 Mixed hyperlipidemia: Secondary | ICD-10-CM | POA: Diagnosis not present

## 2012-04-25 DIAGNOSIS — Z79899 Other long term (current) drug therapy: Secondary | ICD-10-CM | POA: Diagnosis not present

## 2012-04-25 DIAGNOSIS — Z0000 Encounter for general adult medical examination without abnormal findings: Secondary | ICD-10-CM | POA: Diagnosis not present

## 2012-04-25 DIAGNOSIS — E0520 Thyrotoxicosis with toxic multinodular goiter without thyrotoxic crisis or storm: Secondary | ICD-10-CM | POA: Diagnosis not present

## 2012-04-25 DIAGNOSIS — J309 Allergic rhinitis, unspecified: Secondary | ICD-10-CM | POA: Diagnosis not present

## 2012-04-25 DIAGNOSIS — E559 Vitamin D deficiency, unspecified: Secondary | ICD-10-CM | POA: Diagnosis not present

## 2012-04-25 DIAGNOSIS — Z1331 Encounter for screening for depression: Secondary | ICD-10-CM | POA: Diagnosis not present

## 2012-06-22 DIAGNOSIS — E0520 Thyrotoxicosis with toxic multinodular goiter without thyrotoxic crisis or storm: Secondary | ICD-10-CM | POA: Diagnosis not present

## 2012-07-12 DIAGNOSIS — R339 Retention of urine, unspecified: Secondary | ICD-10-CM | POA: Diagnosis not present

## 2012-07-12 DIAGNOSIS — R351 Nocturia: Secondary | ICD-10-CM | POA: Diagnosis not present

## 2012-07-12 DIAGNOSIS — N401 Enlarged prostate with lower urinary tract symptoms: Secondary | ICD-10-CM | POA: Diagnosis not present

## 2012-07-12 DIAGNOSIS — R3129 Other microscopic hematuria: Secondary | ICD-10-CM | POA: Diagnosis not present

## 2012-08-09 DIAGNOSIS — H4011X0 Primary open-angle glaucoma, stage unspecified: Secondary | ICD-10-CM | POA: Diagnosis not present

## 2012-08-09 DIAGNOSIS — H409 Unspecified glaucoma: Secondary | ICD-10-CM | POA: Diagnosis not present

## 2012-09-18 DIAGNOSIS — R1032 Left lower quadrant pain: Secondary | ICD-10-CM | POA: Diagnosis not present

## 2012-10-27 DIAGNOSIS — E782 Mixed hyperlipidemia: Secondary | ICD-10-CM | POA: Diagnosis not present

## 2012-10-27 DIAGNOSIS — I10 Essential (primary) hypertension: Secondary | ICD-10-CM | POA: Diagnosis not present

## 2012-10-27 DIAGNOSIS — J449 Chronic obstructive pulmonary disease, unspecified: Secondary | ICD-10-CM | POA: Diagnosis not present

## 2012-10-27 DIAGNOSIS — E0520 Thyrotoxicosis with toxic multinodular goiter without thyrotoxic crisis or storm: Secondary | ICD-10-CM | POA: Diagnosis not present

## 2012-10-27 DIAGNOSIS — Z0000 Encounter for general adult medical examination without abnormal findings: Secondary | ICD-10-CM | POA: Diagnosis not present

## 2012-10-27 DIAGNOSIS — Z23 Encounter for immunization: Secondary | ICD-10-CM | POA: Diagnosis not present

## 2012-11-22 DIAGNOSIS — H409 Unspecified glaucoma: Secondary | ICD-10-CM | POA: Diagnosis not present

## 2012-11-22 DIAGNOSIS — H4011X0 Primary open-angle glaucoma, stage unspecified: Secondary | ICD-10-CM | POA: Diagnosis not present

## 2012-12-06 ENCOUNTER — Ambulatory Visit: Payer: Medicare Other | Admitting: Vascular Surgery

## 2012-12-12 ENCOUNTER — Other Ambulatory Visit: Payer: Self-pay | Admitting: *Deleted

## 2012-12-12 DIAGNOSIS — I714 Abdominal aortic aneurysm, without rupture: Secondary | ICD-10-CM

## 2012-12-12 DIAGNOSIS — Z48812 Encounter for surgical aftercare following surgery on the circulatory system: Secondary | ICD-10-CM

## 2012-12-19 DIAGNOSIS — E0520 Thyrotoxicosis with toxic multinodular goiter without thyrotoxic crisis or storm: Secondary | ICD-10-CM | POA: Diagnosis not present

## 2012-12-21 DIAGNOSIS — E0520 Thyrotoxicosis with toxic multinodular goiter without thyrotoxic crisis or storm: Secondary | ICD-10-CM | POA: Diagnosis not present

## 2012-12-26 ENCOUNTER — Encounter: Payer: Self-pay | Admitting: Vascular Surgery

## 2012-12-27 ENCOUNTER — Encounter: Payer: Self-pay | Admitting: Vascular Surgery

## 2012-12-27 ENCOUNTER — Ambulatory Visit (INDEPENDENT_AMBULATORY_CARE_PROVIDER_SITE_OTHER): Payer: Medicare Other | Admitting: Vascular Surgery

## 2012-12-27 ENCOUNTER — Encounter (INDEPENDENT_AMBULATORY_CARE_PROVIDER_SITE_OTHER): Payer: Medicare Other | Admitting: *Deleted

## 2012-12-27 VITALS — BP 116/71 | HR 86 | Resp 16 | Ht 71.0 in | Wt 151.0 lb

## 2012-12-27 DIAGNOSIS — I714 Abdominal aortic aneurysm, without rupture: Secondary | ICD-10-CM

## 2012-12-27 DIAGNOSIS — Z48812 Encounter for surgical aftercare following surgery on the circulatory system: Secondary | ICD-10-CM

## 2012-12-27 NOTE — Progress Notes (Signed)
Vascular and Vein Specialist of [facility]  Patient name: Joseph [name] MRN: [id number] DOB: 11-16-1929 Sex: male  REASON FOR VISIT: follow up after percutaneous endovascular aneurysm repair  HPI: Joseph [name] is a 79 y.o. male who underwent repair of a 5.6 cm abdominal aortic aneurysm with percutaneous EVAR on 10/15/2010. He comes in for a routine follow up visit. He denies any abdominal pain or back pain. His activity is fairly limited because of his age. He does have a history of diabetes and hypertension which have been under good control.   REVIEW OF SYSTEMS: Arly.Keller ] denotes positive finding; [  ] denotes negative finding  CARDIOVASCULAR:  [ ]  chest pain   [ ]  dyspnea on exertion    CONSTITUTIONAL:  [ ]  fever   [ ]  chills  PHYSICAL EXAM: Filed Vitals:   12/27/12 1014  BP: 116/71  Pulse: 86  Resp: 16  Height: 5\' 11"  (1.803 m)  Weight: 151 lb (68.493 kg)  SpO2: 96%   Body mass index is 21.07 kg/(m^2). GENERAL: The patient is a well-nourished male, in no acute distress. The vital signs are documented above. CARDIOVASCULAR: There is a regular rate and rhythm  PULMONARY: There is good air exchange bilaterally without wheezing or rales. Abdomen: Soft and nontender. Palpable femoral pulses.  I have independently interpreted his duplex of his aneurysm which shows that the maximum diameter is 4 cm.  MEDICAL ISSUES:  Abdominal aneurysm without mention of rupture This patient underwent percutaneous endovascular aneurysm repair in January of 2012. Follow up studies have shown 13 years and has gradually been decreasing in size and now measures 4.0 cm in maximum diameter. There is no evidence of endoleak. I think we can continue yearly follow up ultrasound scans to follow his EVAR. Certainly it is any problems identified I'll be happy to see him at any point. Otherwise I'll plan on seeing him as needed. I have discussed the importance of receiving prophylactic antibiotics for any  invasive procedures.   Adir Schicker S [facility] [place] [facility]per: [phone]

## 2012-12-27 NOTE — Assessment & Plan Note (Signed)
This patient underwent percutaneous endovascular aneurysm repair in January of 2012. Follow up studies have shown 13 years and has gradually been decreasing in size and now measures 4.0 cm in maximum diameter. There is no evidence of endoleak. I think we can continue yearly follow up ultrasound scans to follow his EVAR. Certainly it is any problems identified I'll be happy to see him at any point. Otherwise I'll plan on seeing him as needed. I have discussed the importance of receiving prophylactic antibiotics for any invasive procedures.

## 2013-01-09 DIAGNOSIS — H5210 Myopia, unspecified eye: Secondary | ICD-10-CM | POA: Diagnosis not present

## 2013-01-09 DIAGNOSIS — H2510 Age-related nuclear cataract, unspecified eye: Secondary | ICD-10-CM | POA: Diagnosis not present

## 2013-01-09 DIAGNOSIS — H524 Presbyopia: Secondary | ICD-10-CM | POA: Diagnosis not present

## 2013-01-09 DIAGNOSIS — Z961 Presence of intraocular lens: Secondary | ICD-10-CM | POA: Diagnosis not present

## 2013-02-05 DIAGNOSIS — H2510 Age-related nuclear cataract, unspecified eye: Secondary | ICD-10-CM | POA: Diagnosis not present

## 2013-02-05 DIAGNOSIS — H269 Unspecified cataract: Secondary | ICD-10-CM | POA: Diagnosis not present

## 2013-04-12 ENCOUNTER — Emergency Department (HOSPITAL_COMMUNITY): Payer: Medicare Other

## 2013-04-12 ENCOUNTER — Encounter (HOSPITAL_COMMUNITY): Payer: Self-pay | Admitting: Emergency Medicine

## 2013-04-12 ENCOUNTER — Inpatient Hospital Stay (HOSPITAL_COMMUNITY)
Admission: EM | Admit: 2013-04-12 | Discharge: 2013-04-14 | DRG: 066 | Disposition: A | Discharge status: Home Health | Payer: Medicare Other | Attending: Internal Medicine | Admitting: Internal Medicine

## 2013-04-12 ENCOUNTER — Inpatient Hospital Stay (HOSPITAL_COMMUNITY): Payer: Medicare Other

## 2013-04-12 DIAGNOSIS — I2510 Atherosclerotic heart disease of native coronary artery without angina pectoris: Secondary | ICD-10-CM | POA: Diagnosis not present

## 2013-04-12 DIAGNOSIS — E119 Type 2 diabetes mellitus without complications: Secondary | ICD-10-CM | POA: Diagnosis present

## 2013-04-12 DIAGNOSIS — Z794 Long term (current) use of insulin: Secondary | ICD-10-CM

## 2013-04-12 DIAGNOSIS — E0590 Thyrotoxicosis, unspecified without thyrotoxic crisis or storm: Secondary | ICD-10-CM | POA: Diagnosis present

## 2013-04-12 DIAGNOSIS — J449 Chronic obstructive pulmonary disease, unspecified: Secondary | ICD-10-CM | POA: Diagnosis present

## 2013-04-12 DIAGNOSIS — R209 Unspecified disturbances of skin sensation: Secondary | ICD-10-CM | POA: Diagnosis not present

## 2013-04-12 DIAGNOSIS — I6789 Other cerebrovascular disease: Secondary | ICD-10-CM | POA: Diagnosis not present

## 2013-04-12 DIAGNOSIS — E049 Nontoxic goiter, unspecified: Secondary | ICD-10-CM | POA: Diagnosis present

## 2013-04-12 DIAGNOSIS — I6529 Occlusion and stenosis of unspecified carotid artery: Secondary | ICD-10-CM | POA: Diagnosis not present

## 2013-04-12 DIAGNOSIS — R6889 Other general symptoms and signs: Secondary | ICD-10-CM | POA: Diagnosis not present

## 2013-04-12 DIAGNOSIS — I252 Old myocardial infarction: Secondary | ICD-10-CM

## 2013-04-12 DIAGNOSIS — I714 Abdominal aortic aneurysm, without rupture, unspecified: Secondary | ICD-10-CM | POA: Diagnosis present

## 2013-04-12 DIAGNOSIS — I639 Cerebral infarction, unspecified: Secondary | ICD-10-CM | POA: Diagnosis present

## 2013-04-12 DIAGNOSIS — E7800 Pure hypercholesterolemia, unspecified: Secondary | ICD-10-CM | POA: Diagnosis present

## 2013-04-12 DIAGNOSIS — I6350 Cerebral infarction due to unspecified occlusion or stenosis of unspecified cerebral artery: Secondary | ICD-10-CM | POA: Diagnosis not present

## 2013-04-12 DIAGNOSIS — J438 Other emphysema: Secondary | ICD-10-CM | POA: Diagnosis present

## 2013-04-12 DIAGNOSIS — E785 Hyperlipidemia, unspecified: Secondary | ICD-10-CM | POA: Diagnosis not present

## 2013-04-12 DIAGNOSIS — Z87891 Personal history of nicotine dependence: Secondary | ICD-10-CM | POA: Diagnosis not present

## 2013-04-12 DIAGNOSIS — E079 Disorder of thyroid, unspecified: Secondary | ICD-10-CM | POA: Diagnosis present

## 2013-04-12 DIAGNOSIS — I739 Peripheral vascular disease, unspecified: Secondary | ICD-10-CM | POA: Diagnosis present

## 2013-04-12 DIAGNOSIS — J42 Unspecified chronic bronchitis: Secondary | ICD-10-CM | POA: Diagnosis not present

## 2013-04-12 DIAGNOSIS — R531 Weakness: Secondary | ICD-10-CM | POA: Diagnosis present

## 2013-04-12 DIAGNOSIS — I10 Essential (primary) hypertension: Secondary | ICD-10-CM | POA: Diagnosis not present

## 2013-04-12 HISTORY — DX: Cerebral infarction, unspecified: I63.9

## 2013-04-12 HISTORY — DX: Type 2 diabetes mellitus without complications: E11.9

## 2013-04-12 LAB — BASIC METABOLIC PANEL
BUN: 14 mg/dL (ref 6–23)
Chloride: 97 mEq/L (ref 96–112)
Creatinine, Ser: 0.81 mg/dL (ref 0.50–1.35)
GFR calc Af Amer: >90 mL/min (ref >90)
GFR calc non Af Amer: 82 mL/min — ABNORMAL LOW (ref >90)
Potassium: 4.2 mEq/L (ref 3.5–5.1)

## 2013-04-12 LAB — CBC WITH DIFFERENTIAL/PLATELET
Basophils Absolute: 0 10*3/uL (ref 0.0–0.1)
Basophils Relative: 0 % (ref 0–1)
HCT: 49.3 % (ref 39.0–52.0)
Lymphocytes Relative: 19 % (ref 12–46)
MCHC: 35.1 g/dL (ref 30.0–36.0)
Monocytes Absolute: 1 10*3/uL (ref 0.1–1.0)
Neutro Abs: 6.6 10*3/uL (ref 1.7–7.7)
Neutrophils Relative %: 69 % (ref 43–77)
RDW: 14 % (ref 11.5–15.5)
WBC: 9.5 10*3/uL (ref 4.0–10.5)

## 2013-04-12 LAB — GLUCOSE, CAPILLARY: Glucose-Capillary: 156 mg/dL — ABNORMAL HIGH (ref 70–99)

## 2013-04-12 LAB — URINALYSIS, ROUTINE W REFLEX MICROSCOPIC
Leukocytes, UA: NEGATIVE
Nitrite: NEGATIVE
Specific Gravity, Urine: 1.021 (ref 1.005–1.030)
Urobilinogen, UA: 1 mg/dL (ref 0.0–1.0)
pH: 5.5 (ref 5.0–8.0)

## 2013-04-12 LAB — URINE MICROSCOPIC-ADD ON

## 2013-04-12 MED ORDER — SODIUM CHLORIDE 0.9 % IV SOLN
Freq: Once | INTRAVENOUS | Status: AC
  Administered 2013-04-12: 12:00:00 via INTRAVENOUS

## 2013-04-12 MED ORDER — ALBUTEROL SULFATE (5 MG/ML) 0.5% IN NEBU
2.5000 mg | INHALATION_SOLUTION | RESPIRATORY_TRACT | Status: DC | PRN
Start: 2013-04-12 — End: 2013-04-14

## 2013-04-12 MED ORDER — ASPIRIN 300 MG RE SUPP
300.0000 mg | Freq: Every day | RECTAL | Status: DC
  Filled 2013-04-12: qty 1

## 2013-04-12 MED ORDER — EZETIMIBE 10 MG PO TABS
10.0000 mg | ORAL_TABLET | Freq: Every day | ORAL | Status: DC
  Administered 2013-04-12 – 2013-04-14 (×3): 10 mg via ORAL
  Filled 2013-04-12 (×3): qty 1

## 2013-04-12 MED ORDER — ACETAMINOPHEN 325 MG PO TABS: 650.0000 mg | ORAL_TABLET | ORAL | Status: DC | PRN

## 2013-04-12 MED ORDER — BRINZOLAMIDE 1 % OP SUSP
1.0000 [drp] | Freq: Every morning | OPHTHALMIC | Status: DC
  Administered 2013-04-13 – 2013-04-14 (×2): 1 [drp] via OPHTHALMIC
  Filled 2013-04-12: qty 10

## 2013-04-12 MED ORDER — FLUTICASONE PROPIONATE HFA 44 MCG/ACT IN AERO
2.0000 | INHALATION_SPRAY | Freq: Two times a day (BID) | RESPIRATORY_TRACT | Status: DC
  Administered 2013-04-12 – 2013-04-14 (×4): 2 via RESPIRATORY_TRACT
  Filled 2013-04-12: qty 10.6

## 2013-04-12 MED ORDER — INSULIN ASPART 100 UNIT/ML ~~LOC~~ SOLN: 0.0000 [IU] | Freq: Every day | SUBCUTANEOUS | Status: DC

## 2013-04-12 MED ORDER — TRAVOPROST (BAK FREE) 0.004 % OP SOLN
1.0000 [drp] | Freq: Every day | OPHTHALMIC | Status: DC
  Administered 2013-04-12 – 2013-04-13 (×2): 1 [drp] via OPHTHALMIC
  Filled 2013-04-12: qty 2.5

## 2013-04-12 MED ORDER — ASPIRIN 325 MG PO TABS
325.0000 mg | ORAL_TABLET | Freq: Every day | ORAL | Status: DC
  Administered 2013-04-12: 325 mg via ORAL
  Filled 2013-04-12 (×2): qty 1

## 2013-04-12 MED ORDER — TIOTROPIUM BROMIDE MONOHYDRATE 18 MCG IN CAPS
18.0000 ug | ORAL_CAPSULE | Freq: Every day | RESPIRATORY_TRACT | Status: DC
  Administered 2013-04-12 – 2013-04-14 (×3): 18 ug via RESPIRATORY_TRACT
  Filled 2013-04-12: qty 5

## 2013-04-12 MED ORDER — ACETAMINOPHEN 650 MG RE SUPP: 650.0000 mg | RECTAL | Status: DC | PRN

## 2013-04-12 MED ORDER — INSULIN ASPART 100 UNIT/ML ~~LOC~~ SOLN
0.0000 [IU] | Freq: Three times a day (TID) | SUBCUTANEOUS | Status: DC
  Administered 2013-04-12 – 2013-04-13 (×2): 2 [IU] via SUBCUTANEOUS
  Administered 2013-04-14: 1 [IU] via SUBCUTANEOUS
  Administered 2013-04-14: 2 [IU] via SUBCUTANEOUS

## 2013-04-12 MED ORDER — VITAMIN D3 25 MCG (1000 UNIT) PO TABS
1000.0000 [IU] | ORAL_TABLET | Freq: Every day | ORAL | Status: DC
  Administered 2013-04-12 – 2013-04-14 (×3): 1000 [IU] via ORAL
  Filled 2013-04-12 (×3): qty 1

## 2013-04-12 MED ORDER — SENNOSIDES-DOCUSATE SODIUM 8.6-50 MG PO TABS: 1.0000 | ORAL_TABLET | Freq: Every evening | ORAL | Status: DC | PRN

## 2013-04-12 MED ORDER — ENOXAPARIN SODIUM 40 MG/0.4ML ~~LOC~~ SOLN
40.0000 mg | SUBCUTANEOUS | Status: DC
  Administered 2013-04-12 – 2013-04-13 (×2): 40 mg via SUBCUTANEOUS
  Filled 2013-04-12 (×3): qty 0.4

## 2013-04-12 NOTE — ED Provider Notes (Addendum)
History    CSN: [id number] Arrival date & time 04/12/13  0903  First MD Initiated Contact with Patient 04/12/13 [phone]     Chief Complaint  Patient presents with  . Numbness  . Tingling   (Consider location/radiation/quality/duration/timing/severity/associated sxs/prior Treatment) HPI Comments: Pt comes in with cc of numbness to the RUE. Hx of iddm, copd, mi, aaa s/p repair, no hx of strokes. Pt went to bed last night without any problems, woke up this morning, and has been having RUE numbness. No numbness, weakness elsewhere in the body, and feels that the strength is intact in the RUE. He is right handed.  The history is provided by the patient.   Past Medical History  Diagnosis Date  . Diabetes mellitus   . Hyperlipidemia   . Myocardial infarction   . COPD (chronic obstructive pulmonary disease)   . Emphysema of lung   . Abdominal aneurysm without mention of rupture   . Thyroid disease     Hyperthyroidism, Goiter  . Hypotension    Past Surgical History  Procedure Laterality Date  . Hernia repair      LEFT INGUINAL  . Abdominal aorta stent      ENDOSTENT REPAIR 10/15/2010  . Abdominal aortic aneurysm repair  05/26/11    PEVAR  . Prostate surgery  Sept. 2012   Family History  Problem Relation Age of Onset  . Alzheimer's disease Father    History  Substance Use Topics  . Smoking status: Former Smoker -- 1.50 packs/day    Types: Cigarettes    Quit date: 10/04/2004  . Smokeless tobacco: Former Neurosurgeon  . Alcohol Use: No    Review of Systems  Constitutional: Positive for activity change and fatigue. Negative for fever.  HENT: Negative for neck pain and neck stiffness.   Eyes: Negative for visual disturbance.  Respiratory: Negative for cough, chest tightness and shortness of breath.   Cardiovascular: Negative for chest pain.  Gastrointestinal: Negative for abdominal distention.  Genitourinary: Negative for dysuria, enuresis and difficulty urinating.   Musculoskeletal: Negative for arthralgias.  Neurological: Positive for weakness and numbness. Negative for dizziness, light-headedness and headaches.  Psychiatric/Behavioral: Negative for confusion.    Allergies  Other and Statins  Home Medications   Current Outpatient Rx  Name  Route  Sig  Dispense  Refill  . aspirin 81 MG tablet   Oral   Take 162 mg by mouth daily.          . brinzolamide (AZOPT) 1 % ophthalmic suspension   Left Eye   Place 1 drop into the left eye every morning.          . budesonide (PULMICORT FLEXHALER) 180 MCG/ACT inhaler   Inhalation   Inhale 1 puff into the lungs 2 (two) times daily.           . cholecalciferol (VITAMIN D) 1000 UNITS tablet   Oral   Take 1,000 Units by mouth daily.         Marland Kitchen ezetimibe (ZETIA) 10 MG tablet   Oral   Take 10 mg by mouth daily.          . metFORMIN (GLUCOPHAGE) 500 MG tablet   Oral   Take 500 mg by mouth 2 (two) times daily with a meal.           . SPIRIVA HANDIHALER 18 MCG inhalation capsule      daily.         . travoprost, benzalkonium, (TRAVATAN) 0.004 % ophthalmic  solution   Both Eyes   Place 1 drop into both eyes at bedtime.          Marland Kitchen EXPIRED: albuterol (PROVENTIL) (5 MG/ML) 0.5% nebulizer solution   Nebulization   Take 0.5 mLs (2.5 mg total) by nebulization every 6 (six) hours.   20 mL   5   . EXPIRED: albuterol (PROVENTIL) (5 MG/ML) 0.5% nebulizer solution   Nebulization   Take 0.5 mLs (2.5 mg total) by nebulization every 2 (two) hours as needed for wheezing.   20 mL   0   . EXPIRED: insulin glargine (LANTUS) 100 UNIT/ML injection   Subcutaneous   Inject 10 Units into the skin at bedtime.   10 mL   0   . EXPIRED: ipratropium (ATROVENT) 0.02 % nebulizer solution   Nebulization   Take 2.5 mLs (0.5 mg total) by nebulization 3 (three) times daily.   75 mL   0    BP 112/79  Pulse 84  Temp(Src) 97.7 F (36.5 C) (Oral)  Resp 18  SpO2 96% Physical Exam  Nursing note  and vitals reviewed. Constitutional: He is oriented to person, place, and time. He appears well-developed.  HENT:  Head: Normocephalic and atraumatic.  Eyes: Conjunctivae and EOM are normal. Pupils are equal, round, and reactive to light.  Neck: Normal range of motion. Neck supple.  Cardiovascular: Normal rate and regular rhythm.   Murmur heard. Pulmonary/Chest: Effort normal and breath sounds normal.  Abdominal: Soft. Bowel sounds are normal. He exhibits no distension. There is no tenderness. There is no rebound and no guarding.  Neurological: He is alert and oriented to person, place, and time.  Cerebellar exam is normal (finger to nose) Sensory exam normal for bilateral upper and lower extremities reveals subjective numbness to the RUE. Patient is able to discriminate between sharp and dull in all 4s. Motor exam is 4+/5   Skin: Skin is warm.    ED Course  Procedures (including critical care time) Labs Reviewed  BASIC METABOLIC PANEL  CBC WITH DIFFERENTIAL  URINALYSIS, ROUTINE W REFLEX MICROSCOPIC   No results found. No diagnosis found.  MDM   Date: 04/12/2013  Rate: 91  Rhythm: normal sinus rhythm  QRS Axis: normal  Intervals: normal  ST/T Wave abnormalities: normal  Conduction Disutrbances: RBBB  Narrative Interpretation: unremarkable  DDx includes:  Stroke - ischemic vs. hemorrhagic Cspine nerve impingement Neuropathy Myelitis Electrolyte abnormality Neuropathy  Pt with hx of iddm, hl comes in with right sided deficits. Concerns for stroke. Cspine exam normal, not suspecting radiculopathy. Out of TPA window.  Derwood Kaplan, MD 04/12/13 4098  Derwood Kaplan, MD 04/12/13 1026  12:31 PM Dr. Petra Kuba informed of the patient. He had requested MRI, which confirmed the stroke. Dr. Petra Kuba requesting transfer to [facility].    Derwood Kaplan, MD 04/12/13 1231

## 2013-04-12 NOTE — ED Notes (Signed)
Pt transported to MRI 

## 2013-04-12 NOTE — Progress Notes (Signed)
VASCULAR LAB PRELIMINARY  PRELIMINARY  PRELIMINARY  PRELIMINARY  Carotid Dopplers completed.    Preliminary report:  There is 0-39% ICA stenosis.  Vertebral artery flow is antegrade.  Kairyn Olmeda, RVT 04/12/2013, 4:32 PM

## 2013-04-12 NOTE — Consult Note (Signed)
Neurology Consultation Reason for Consult: Stroke Referring Physician: Hongalgi, A  CC: Stroke  History is obtained from: Patient, daughter  HPI: Joseph [name] is a 79 y.o. male a history of hypercholesterolemia, diabetes who presents with right arm numbness that started this morning. His daughter states that possibly since Tuesday he has had some mild slurred speech, though this is not clear. Today he has had mild slurred speech and left arm numbness which is not improving and therefore he came into the ER. There on MRI showed a subcortical infarction on the left   LKW: 7/09 prior to going to bed tpa given: no, outside of window NIHSS: 3    ROS: A 14 point ROS was performed and is negative except as noted in the HPI.  Past Medical History  Diagnosis Date  . Diabetes mellitus, type 2   . Hyperlipidemia   . Myocardial infarction   . COPD (chronic obstructive pulmonary disease)   . Emphysema of lung   . Abdominal aneurysm without mention of rupture   . Thyroid disease     Hyperthyroidism, Goiter  . Hypotension     Family History: No family history of stroke  Social History: Tob: Denies currently, but has long history of smoking  Exam: Current vital signs: BP 133/63  Pulse 81  Temp(Src) 97.5 F (36.4 C) (Oral)  Resp 19  Ht 5\' 11"  (1.803 m)  Wt 66 kg (145 lb 8.1 oz)  BMI 20.3 kg/m2  SpO2 99% Vital signs in last 24 hours: Temp:  [97.5 F (36.4 C)-97.7 F (36.5 C)] 97.5 F (36.4 C) (07/10 1741) Pulse Rate:  [81-93] 81 (07/10 1741) Resp:  [17-19] 19 (07/10 1741) BP: (112-133)/(58-79) 133/63 mmHg (07/10 1741) SpO2:  [96 %-99 %] 99 % (07/10 1741) Weight:  [66 kg (145 lb 8.1 oz)] 66 kg (145 lb 8.1 oz) (07/10 1432)  General: In bed, NAD CV: Regular rate and rhythm Mental Status: Patient is awake, alert, oriented to person, place, month, year, and situation. Immediate and remote memory are intact. Patient is able to give a clear and coherent history. No signs  of aphasia or neglect Mild dysarthria Cranial Nerves: II: Visual Fields are full. Pupils are equal, round, and reactive to light.  Discs are difficult to visualize. III,IV, VI: EOMI without ptosis or diploplia.  V: Facial sensation is decreased on right face VII: Facial movement is symmetric.  VIII: hearing is intact to voice X: Uvula elevates symmetrically XI: Shoulder shrug is symmetric. XII: tongue is midline without atrophy or fasciculations.  Motor: Tone is normal. Bulk is normal. 5/5 strength was present in all four extremities. He has an orbital sign on the right, but no significant drift. He does have slowed movements of his right upper extremity  Sensory: Sensation is diminished in right arm and face, but intact and hand Deep Tendon Reflexes: 2+ and symmetric in the biceps and patellae.  Cerebellar: FNF mildly impaired on right, intact on left Gait: Patient is able to walk unassisted  I have reviewed labs in epic and the results pertinent to this consultation are: BMP unremarkable  I have reviewed the images obtained: MRI brain small subcortical infarct on the left  Impression: 79 year old male with high cholesterol and diabetes who presents with subacute subcortical infarct. I suspect small vessel disease, but embolic disease could be possible.  Recommendations: 1. HgbA1c, fasting lipid panel 2. MRI, MRA  of the brain without contrast 3. Frequent neuro checks 4. Echocardiogram 5. Carotid dopplers 6. Prophylactic  therapy-Antiplatelet med: Aspirin - dose 325mg , likely will change to Plavix prior to discharge 7. Risk factor modification 8. Telemetry monitoring 9. PT consult, OT consult, Speech consult    Ritta Slot, MD [facility] [phone]  If 7pm- 7am, please page neurology on call at [phone].

## 2013-04-12 NOTE — ED Notes (Signed)
Pt will continue to try to use the restroom, has been given a urinal and water.

## 2013-04-12 NOTE — ED Notes (Signed)
Family reports pt has not been feeling well over the past few weeks. Reports generalized weakness - pt usually takes out garbage every night, has not had the strength to do this lately. Family reports they noticed "slurred speech" yesterday, which resolved. Pt c/o right arm numbness/tingling that began today

## 2013-04-12 NOTE — ED Notes (Signed)
Per EMS, woke up with right arm numbness and tingling, states similar problem one month ago but sensation returned, , states has not gone away today, EMS states no pronator drift, equal strengths bilaterally, also c/o cough

## 2013-04-12 NOTE — ED Notes (Signed)
Patient transported to MRI 

## 2013-04-12 NOTE — H&P (Addendum)
[facility] History and Physical  Joseph [name]:[id number] DOB: Jul 27, 1930 DOA: 04/12/2013  Referring physician: Dr. Derwood Kaplan, EDP PCP: Pearla Dubonnet, MD  OP Specialists:  1. Vascular Surgery: Dr. Waverly Ferrari 2. Cardiology: Dr. Verdis Prime 3. Urology: Dr. Heloise Purpura  Chief Complaint: Slurred speech and right upper extremity numbness  HPI: Joseph [name] is a 79 y.o. male with PMH of type II DM, HDL, CAD, COPD, AAA status post stent, thyroid disease presented to the [facility] ED on 04/12/13 with complaints of slurred speech and right upper extremity numbness. History is provided by patient and family at bedside. Family apparently noticed slurred speech on Tuesday/48 hours ago. At that time there was no other deficits. This morning when patient woke up at approximately 5:30 AM, he noticed numbness and tingling of the entire right upper extremity but denied associated pain or weakness. No headache or neck pain. Denies weakness of any limbs, facial twisting, chest pain, dyspnea or palpitations. According to family, patient has been generally feeling unwell for the last week with poor appetite and mostly eating and sitting on his chair. Patient denies fever, chills, earache, sore throat, cough, nausea, vomiting, abdominal pain, diarrhea, dysuria or any other complaints. In the ED, MRI brain confirms deep white matter infarct on the left. EDP consulted neuro hospitalist who recommended transfer patient to neurology unit at [facility] for close observation and evaluation. Patient is out of window for TPA due to mild symptoms and prolonged duration. Patient has past bedside R.N. swallow screen. Hospitalist admission requested.   Review of Systems: All systems reviewed and apart from history of presenting illness, are negative.   Past Medical History  Diagnosis Date  . Diabetes mellitus, type 2   . Hyperlipidemia   . Myocardial infarction   .  COPD (chronic obstructive pulmonary disease)   . Emphysema of lung   . Abdominal aneurysm without mention of rupture   . Thyroid disease     Hyperthyroidism, Goiter  . Hypotension    Past Surgical History  Procedure Laterality Date  . Hernia repair      LEFT INGUINAL  . Abdominal aorta stent      ENDOSTENT REPAIR 10/15/2010  . Abdominal aortic aneurysm repair  05/26/11    PEVAR  . Prostate surgery  Sept. 2012   Social History:  reports that he quit smoking about 8 years ago. His smoking use included Cigarettes. He smoked 1.50 packs per day. He has quit using smokeless tobacco. He reports that he does not drink alcohol or use illicit drugs. Married. Lives with spouse. Independent of activities of daily living. Does not use home oxygen.  Allergies  Allergen Reactions  . Other Other (See Comments)    Says that antibiotics made his mouth sore   . Statins Other (See Comments)    Body aches    Family History  Problem Relation Age of Onset  . Alzheimer's disease Father     Prior to Admission medications   Medication Sig Start Date End Date Taking? Authorizing Provider  aspirin 81 MG tablet Take 162 mg by mouth daily.    Yes Historical Provider, MD  brinzolamide (AZOPT) 1 % ophthalmic suspension Place 1 drop into the left eye every morning.    Yes Historical Provider, MD  budesonide (PULMICORT FLEXHALER) 180 MCG/ACT inhaler Inhale 1 puff into the lungs 2 (two) times daily.     Yes Historical Provider, MD  cholecalciferol (VITAMIN D) 1000 UNITS tablet Take 1,000  Units by mouth daily.   Yes Historical Provider, MD  ezetimibe (ZETIA) 10 MG tablet Take 10 mg by mouth daily.    Yes Historical Provider, MD  metFORMIN (GLUCOPHAGE) 500 MG tablet Take 500 mg by mouth 2 (two) times daily with a meal.     Yes Historical Provider, MD  SPIRIVA HANDIHALER 18 MCG inhalation capsule daily. 12/19/12  Yes Historical Provider, MD  travoprost, benzalkonium, (TRAVATAN) 0.004 % ophthalmic solution Place 1  drop into both eyes at bedtime.    Yes Historical Provider, MD  albuterol (PROVENTIL) (5 MG/ML) 0.5% nebulizer solution Take 0.5 mLs (2.5 mg total) by nebulization every 6 (six) hours. 10/08/11 10/07/12  Marden Noble, MD  albuterol (PROVENTIL) (5 MG/ML) 0.5% nebulizer solution Take 0.5 mLs (2.5 mg total) by nebulization every 2 (two) hours as needed for wheezing. 10/08/11 10/07/12  Marden Noble, MD  insulin glargine (LANTUS) 100 UNIT/ML injection Inject 10 Units into the skin at bedtime. 10/01/11 09/30/12  Marden Noble, MD  ipratropium (ATROVENT) 0.02 % nebulizer solution Take 2.5 mLs (0.5 mg total) by nebulization 3 (three) times daily. 10/01/11 09/30/12  Marden Noble, MD   Physical Exam: Filed Vitals:   04/12/13 0911 04/12/13 1211 04/12/13 1300  BP: 112/79  127/59  Pulse: 84  93  Temp: 97.7 F (36.5 C) 97.7 F (36.5 C)   TempSrc: Oral    Resp: 18  17  SpO2: 96%  96%     General exam: Moderately built and nourished male patient, lying comfortably supine on the gurney in no obvious distress.  Head, eyes and ENT: Nontraumatic and normocephalic. Pupils equally reacting to light and accommodation. Oral mucosa moist.  Neck: Supple. No JVD, carotid bruit or thyromegaly.  Lymphatics: No lymphadenopathy.  Respiratory system: Distant breath sounds but clear to auscultation. No increased work of breathing.  Cardiovascular system: S1 and S2 heard, RRR. No JVD, murmurs, gallops, clicks or pedal edema.  Gastrointestinal system: Abdomen is nondistended, soft and nontender. Normal bowel sounds heard. No organomegaly or masses appreciated.  Central nervous system: Alert and oriented. No obvious dysarthria or facial asymmetry appreciated at this time. No other cranial nerve deficits.  Extremities: Peripheral pulses symmetrically felt.? Patchy sensory deficit right upper extremity. Symmetric 5 x 5 power in all limbs except right lower extremity where 4+/5 power.  Skin: No rashes or acute  findings.  Musculoskeletal system: Negative exam.  Psychiatry: Pleasant and cooperative.   Labs on Admission:  Basic Metabolic Panel:  Recent Labs Lab 04/12/13 0956  NA 135  K 4.2  CL 97  CO2 26  GLUCOSE 128*  BUN 14  CREATININE 0.81  CALCIUM 9.9   Liver Function Tests: No results found for this basename: AST, ALT, ALKPHOS, BILITOT, PROT, ALBUMIN,  in the last 168 hours No results found for this basename: LIPASE, AMYLASE,  in the last 168 hours No results found for this basename: AMMONIA,  in the last 168 hours CBC:  Recent Labs Lab 04/12/13 0956  WBC 9.5  NEUTROABS 6.6  HGB 17.3*  HCT 49.3  MCV 91.8  PLT 263   Cardiac Enzymes: No results found for this basename: CKTOTAL, CKMB, CKMBINDEX, TROPONINI,  in the last 168 hours  BNP (last 3 results) No results found for this basename: PROBNP,  in the last 8760 hours CBG: No results found for this basename: GLUCAP,  in the last 168 hours  Radiological Exams on Admission: Ct Head Wo Contrast  04/12/2013   *RADIOLOGY REPORT*  Clinical Data: Right upper  extremity parasthesias  CT HEAD WITHOUT CONTRAST  Technique:  Contiguous axial images were obtained from the base of the skull through the vertex without contrast.  Comparison: None.  Findings: Diffuse brain atrophy noted with periventricular white matter microvascular ischemic change.  Remote left basal ganglia and centrum semiovale lacunar type infarcts.  No acute intracranial hemorrhage, mass lesion, definite acute infarction, midline shift, herniation, hydrocephalus, or extra-axial fluid collection.  Gray- white matter differentiation maintained.  Cisterns patent. Cerebellar atrophy as well.  Atherosclerosis of the intracranial vessels noted.  Mastoids and sinuses clear.  Symmetric orbits.  IMPRESSION: brain atrophy and chronic microvascular ischemic changes of the white matter.  Remote basal ganglia lacunar infarcts.  No acute process by noncontrast CT.   Original Report  Authenticated By: Judie Petit. Miles Costain, M.D.   Mr Maxine Glenn Head Wo Contrast  04/12/2013   *RADIOLOGY REPORT*  Clinical Data:  Right-sided numbness.  CVA  MRI HEAD WITHOUT CONTRAST MRA HEAD WITHOUT CONTRAST  Technique:  Multiplanar, multiecho pulse sequences of the brain and surrounding structures were obtained without intravenous contrast. Angiographic images of the head were obtained using MRA technique without contrast.  Comparison:  CT 04/12/2013  MRI HEAD  Findings:  1 cm area of restricted diffusion in the deep white matter on the left consistent with acute infarct.  This involves some of the posterior putamen and the posterior internal capsule and external capsule fibers.  No other acute infarct.  Generalized atrophy is present.  Chronic microvascular ischemic changes are present in the cerebral white matter bilaterally. Chronic lacunar infarction in the left pons.  Mild chronic microvascular ischemia in the pons.  Small chronic infarct left cerebellum.  Negative for acute hemorrhage.  Negative for mass lesion. Ventricle size is commensurate with the level of atrophy.  IMPRESSION: Deep white matter infarct on the left.  Atrophy and moderate chronic microvascular ischemia.  MRA HEAD  Findings: The vertebral arteries are patent to the basilar.  PICA is patent bilaterally.  The basilar is widely patent.  Superior cerebellar and posterior cerebral arteries are patent bilaterally.  Cavernous carotid shows mild irregularity and atherosclerotic disease without significant stenosis.  The anterior and middle cerebral arteries are patent bilaterally without stenosis.  Negative for aneurysm.  IMPRESSION: Mild atherosclerotic disease in the cavernous carotid bilaterally. No significant intracranial stenosis.   Original Report Authenticated By: Janeece Riggers, M.D.   Mr Brain Wo Contrast  04/12/2013   *RADIOLOGY REPORT*  Clinical Data:  Right-sided numbness.  CVA  MRI HEAD WITHOUT CONTRAST MRA HEAD WITHOUT CONTRAST  Technique:   Multiplanar, multiecho pulse sequences of the brain and surrounding structures were obtained without intravenous contrast. Angiographic images of the head were obtained using MRA technique without contrast.  Comparison:  CT 04/12/2013  MRI HEAD  Findings:  1 cm area of restricted diffusion in the deep white matter on the left consistent with acute infarct.  This involves some of the posterior putamen and the posterior internal capsule and external capsule fibers.  No other acute infarct.  Generalized atrophy is present.  Chronic microvascular ischemic changes are present in the cerebral white matter bilaterally. Chronic lacunar infarction in the left pons.  Mild chronic microvascular ischemia in the pons.  Small chronic infarct left cerebellum.  Negative for acute hemorrhage.  Negative for mass lesion. Ventricle size is commensurate with the level of atrophy.  IMPRESSION: Deep white matter infarct on the left.  Atrophy and moderate chronic microvascular ischemia.  MRA HEAD  Findings: The vertebral arteries are patent  to the basilar.  PICA is patent bilaterally.  The basilar is widely patent.  Superior cerebellar and posterior cerebral arteries are patent bilaterally.  Cavernous carotid shows mild irregularity and atherosclerotic disease without significant stenosis.  The anterior and middle cerebral arteries are patent bilaterally without stenosis.  Negative for aneurysm.  IMPRESSION: Mild atherosclerotic disease in the cavernous carotid bilaterally. No significant intracranial stenosis.   Original Report Authenticated By: Janeece Riggers, M.D.    EKG: Independently reviewed. Sinus rhythm at 91 beats per minute with PACs and RBBB (not new). No acute changes.  Assessment/Plan Principal Problem:   CVA (cerebral infarction) Active Problems:   COPD (chronic obstructive pulmonary disease)   CAD (coronary artery disease), native coronary artery   Weakness generalized   Abdominal aneurysm without mention of  rupture   Diabetes mellitus, type 2   Hyperlipidemia   Thyroid disease   1. Acute Left brain ischemic stroke : With right upper extremity numbness and? RLE weakness. Likely secondary to small vessel disease. Risk factors-advanced age, atherosclerosis, DM 2 & HL. Transfer to neuro telemetry at [facility] Complete stroke workup. Discussed with neurology recommend aspirin 325 mg daily for now. PT, OT and ST evaluation. Neurology will formally consult. 2. Type II DM: Check hemoglobin A1c. Hold metformin. SSI. 3. Hyperlipidemia: Check fasting lipids. Continue Zetia. 4. History of CAD: Asymptomatic. Continue aspirin and Cynthia. 5. COPD: Stable. Not on home oxygen. Continue home medications and when necessary bronchodilators. 6. Generalized weakness: Unclear etiology. Ongoing for last 1 week. 7. AAA, status post endovascular stent: Continue aspirin. 8. Elevated hemoglobin/? Polycythemia: Status post IV fluids. Check CBC in a.m. 9. Thyroid disease: We'll defer management to PCP-may consider doing TSH of no recent one done as OP.    Code Status: Full  Family Communication:  discussed with patient spouse, daughter and granddaughter at bedside.   Disposition Plan:  home when medically stable.    Time spent:  60 minutes   [facility] Pager [phone]  If 7PM-7AM, please contact night-coverage [email or web address] Password [email or web address] 04/12/2013, 1:59 PM

## 2013-04-12 NOTE — ED Notes (Signed)
MD at bedside. 

## 2013-04-13 DIAGNOSIS — I6350 Cerebral infarction due to unspecified occlusion or stenosis of unspecified cerebral artery: Secondary | ICD-10-CM | POA: Diagnosis not present

## 2013-04-13 DIAGNOSIS — I6789 Other cerebrovascular disease: Secondary | ICD-10-CM | POA: Diagnosis not present

## 2013-04-13 DIAGNOSIS — I10 Essential (primary) hypertension: Secondary | ICD-10-CM | POA: Diagnosis not present

## 2013-04-13 DIAGNOSIS — E119 Type 2 diabetes mellitus without complications: Secondary | ICD-10-CM | POA: Diagnosis not present

## 2013-04-13 LAB — LIPID PANEL
HDL: 39 mg/dL — ABNORMAL LOW (ref >39)
LDL Cholesterol: 92 mg/dL (ref 0–99)
Total CHOL/HDL Ratio: 3.8 RATIO
VLDL: 18 mg/dL (ref 0–40)

## 2013-04-13 LAB — CBC WITH DIFFERENTIAL/PLATELET
Eosinophils Relative: 1 % (ref 0–5)
HCT: 46.8 % (ref 39.0–52.0)
Lymphocytes Relative: 21 % (ref 12–46)
Lymphs Abs: 1.5 10*3/uL (ref 0.7–4.0)
MCV: 92.1 fL (ref 78.0–100.0)
Platelets: 253 10*3/uL (ref 150–400)
RBC: 5.08 MIL/uL (ref 4.22–5.81)
WBC: 7.2 10*3/uL (ref 4.0–10.5)

## 2013-04-13 LAB — GLUCOSE, CAPILLARY: Glucose-Capillary: 159 mg/dL — ABNORMAL HIGH (ref 70–99)

## 2013-04-13 LAB — BASIC METABOLIC PANEL
CO2: 28 mEq/L (ref 19–32)
Calcium: 9.6 mg/dL (ref 8.4–10.5)
Glucose, Bld: 158 mg/dL — ABNORMAL HIGH (ref 70–99)
Potassium: 4 mEq/L (ref 3.5–5.1)
Sodium: 135 mEq/L (ref 135–145)

## 2013-04-13 MED ORDER — CLOPIDOGREL BISULFATE 75 MG PO TABS
75.0000 mg | ORAL_TABLET | Freq: Every day | ORAL | Status: DC
  Administered 2013-04-13 – 2013-04-14 (×2): 75 mg via ORAL
  Filled 2013-04-13 (×3): qty 1

## 2013-04-13 MED FILL — Perflutren Lipid Microsphere IV Susp 1.1 MG/ML: INTRAVENOUS | Qty: 10 | Status: AC

## 2013-04-13 NOTE — Progress Notes (Signed)
UR COMPLETED  

## 2013-04-13 NOTE — Progress Notes (Addendum)
Subjective: Joseph [name] is feeling better today.  Still with slightly slurred speech.  Can raise his right arm vertically but does have slight drift when he closes his eyes.  Plavix is being considered for therapy.  Carotid Dopplers were nonocclusive.  Echocardiogram pending  Objective: Weight change:  No intake or output data in the 24 hours ending 04/13/13 0804 Filed Vitals:   04/12/13 2252 04/13/13 0042 04/13/13 0220 04/13/13 0612  BP: 106/52 112/54 132/66 129/74  Pulse: 73 75 85 83  Temp: 97.5 F (36.4 C) 97.6 F (36.4 C) 97.6 F (36.4 C) 97.4 F (36.3 C)  TempSrc: Oral Oral Oral Oral  Resp: 20 20 20 20   Height:      Weight:      SpO2: 95% 97% 98% 97%    General Appearance: Alert, cooperative, no distress, appears stated age Lungs: Clear to auscultation bilaterally, respirations unlabored Heart: Regular rate and rhythm, S1 and S2 normal, no murmur, rub or gallop Abdomen: Soft, non-tender, bowel sounds active all four quadrants, no masses, no organomegaly Extremities: Extremities normal, atraumatic, no cyanosis or edema Neuro: Alert and oriented.  Reports normal sensation in right face.  Cranial nerves grossly intact but patient does have very mild slurred speech.  Right upper extremity with 4/5 strength, otherwise nonfocal  Lab Results: Results for orders placed during the hospital encounter of 04/12/13 (from the past 48 hour(s))  BASIC METABOLIC PANEL     Status: Abnormal   Collection Time    04/12/13  9:56 AM      Result Value Range   Sodium 135  135 - 145 mEq/L   Potassium 4.2  3.5 - 5.1 mEq/L   Chloride 97  96 - 112 mEq/L   CO2 26  19 - 32 mEq/L   Glucose, Bld 128 (*) 70 - 99 mg/dL   BUN 14  6 - 23 mg/dL   Creatinine, Ser 1.91  0.50 - 1.35 mg/dL   Calcium 9.9  8.4 - 47.8 mg/dL   GFR calc non Af Amer 82 (*) >90 mL/min   GFR calc Af Amer >90  >90 mL/min   Comment:            The eGFR has been calculated     using the CKD EPI equation.     This calculation has  not been     validated in all clinical     situations.     eGFR's persistently     <90 mL/min signify     possible Chronic Kidney Disease.  CBC WITH DIFFERENTIAL     Status: Abnormal   Collection Time    04/12/13  9:56 AM      Result Value Range   WBC 9.5  4.0 - 10.5 K/uL   RBC 5.37  4.22 - 5.81 MIL/uL   Hemoglobin 17.3 (*) 13.0 - 17.0 g/dL   HCT 29.5  62.1 - 30.8 %   MCV 91.8  78.0 - 100.0 fL   MCH 32.2  26.0 - 34.0 pg   MCHC 35.1  30.0 - 36.0 g/dL   RDW 65.7  84.6 - 96.2 %   Platelets 263  150 - 400 K/uL   Neutrophils Relative % 69  43 - 77 %   Neutro Abs 6.6  1.7 - 7.7 K/uL   Lymphocytes Relative 19  12 - 46 %   Lymphs Abs 1.8  0.7 - 4.0 K/uL   Monocytes Relative 11  3 - 12 %   Monocytes Absolute  1.0  0.1 - 1.0 K/uL   Eosinophils Relative 1  0 - 5 %   Eosinophils Absolute 0.1  0.0 - 0.7 K/uL   Basophils Relative 0  0 - 1 %   Basophils Absolute 0.0  0.0 - 0.1 K/uL  URINALYSIS, ROUTINE W REFLEX MICROSCOPIC     Status: Abnormal   Collection Time    04/12/13 12:36 PM      Result Value Range   Color, Urine YELLOW  YELLOW   APPearance CLEAR  CLEAR   Specific Gravity, Urine 1.021  1.005 - 1.030   pH 5.5  5.0 - 8.0   Glucose, UA NEGATIVE  NEGATIVE mg/dL   Hgb urine dipstick SMALL (*) NEGATIVE   Bilirubin Urine NEGATIVE  NEGATIVE   Ketones, ur NEGATIVE  NEGATIVE mg/dL   Protein, ur NEGATIVE  NEGATIVE mg/dL   Urobilinogen, UA 1.0  0.0 - 1.0 mg/dL   Nitrite NEGATIVE  NEGATIVE   Leukocytes, UA NEGATIVE  NEGATIVE  URINE MICROSCOPIC-ADD ON     Status: Abnormal   Collection Time    04/12/13 12:36 PM      Result Value Range   RBC / HPF 7-10  <3 RBC/hpf   Crystals CA OXALATE CRYSTALS (*) NEGATIVE  GLUCOSE, CAPILLARY     Status: Abnormal   Collection Time    04/12/13  2:44 PM      Result Value Range   Glucose-Capillary 165 (*) 70 - 99 mg/dL  GLUCOSE, CAPILLARY     Status: Abnormal   Collection Time    04/12/13  5:39 PM      Result Value Range   Glucose-Capillary 156 (*)  70 - 99 mg/dL  GLUCOSE, CAPILLARY     Status: Abnormal   Collection Time    04/12/13 10:09 PM      Result Value Range   Glucose-Capillary 120 (*) 70 - 99 mg/dL  LIPID PANEL     Status: Abnormal   Collection Time    04/13/13  5:55 AM      Result Value Range   Cholesterol 149  0 - 200 mg/dL   Triglycerides 90  <865 mg/dL   HDL 39 (*) >78 mg/dL   Total CHOL/HDL Ratio 3.8     VLDL 18  0 - 40 mg/dL   LDL Cholesterol 92  0 - 99 mg/dL   Comment:            Total Cholesterol/HDL:CHD Risk     Coronary Heart Disease Risk Table                         Men   Women      1/2 Average Risk   3.4   3.3      Average Risk       5.0   4.4      2 X Average Risk   9.6   7.1      3 X Average Risk  23.4   11.0                Use the calculated Patient Ratio     above and the CHD Risk Table     to determine the patient's CHD Risk.                ATP III CLASSIFICATION (LDL):      <100     mg/dL   Optimal      469-629  mg/dL  Near or Above                        Optimal      130-159  mg/dL   Borderline      829-562  mg/dL   High      >130     mg/dL   Very High  GLUCOSE, CAPILLARY     Status: Abnormal   Collection Time    04/13/13  7:07 AM      Result Value Range   Glucose-Capillary 115 (*) 70 - 99 mg/dL   Comment 1 Documented in Chart     Comment 2 Notify RN      Studies/Results: Dg Chest 2 View  04/12/2013   *RADIOLOGY REPORT*  Clinical Data: Shortness of breath.  Weakness.  Stroke.  CHEST - 2 VIEW  Comparison: 10/03/2011  Findings: Emphysematous changes in the chest with scattered fibrosis and and chronic bronchitic change.  Calcified granulomas in the lungs.  No focal airspace disease or consolidation.  No blunting of costophrenic angles.  No pneumothorax.  Normal heart size and pulmonary vascularity.  Mediastinal contours appear intact.  Tortuous aorta.  Degenerative changes throughout the spine.  No significant change since previous study.  IMPRESSION: Emphysematous and chronic bronchitic  changes in the chest.  No evidence of active pulmonary disease.   Original Report Authenticated By: Burman Nieves, M.D.   Ct Head Wo Contrast  04/12/2013   *RADIOLOGY REPORT*  Clinical Data: Right upper extremity parasthesias  CT HEAD WITHOUT CONTRAST  Technique:  Contiguous axial images were obtained from the base of the skull through the vertex without contrast.  Comparison: None.  Findings: Diffuse brain atrophy noted with periventricular white matter microvascular ischemic change.  Remote left basal ganglia and centrum semiovale lacunar type infarcts.  No acute intracranial hemorrhage, mass lesion, definite acute infarction, midline shift, herniation, hydrocephalus, or extra-axial fluid collection.  Gray- white matter differentiation maintained.  Cisterns patent. Cerebellar atrophy as well.  Atherosclerosis of the intracranial vessels noted.  Mastoids and sinuses clear.  Symmetric orbits.  IMPRESSION: brain atrophy and chronic microvascular ischemic changes of the white matter.  Remote basal ganglia lacunar infarcts.  No acute process by noncontrast CT.   Original Report Authenticated By: Judie Petit. Miles Costain, M.D.   Mr Joseph [name] Head Wo Contrast  04/12/2013   *RADIOLOGY REPORT*  Clinical Data:  Right-sided numbness.  CVA  MRI HEAD WITHOUT CONTRAST MRA HEAD WITHOUT CONTRAST  Technique:  Multiplanar, multiecho pulse sequences of the brain and surrounding structures were obtained without intravenous contrast. Angiographic images of the head were obtained using MRA technique without contrast.  Comparison:  CT 04/12/2013  MRI HEAD  Findings:  1 cm area of restricted diffusion in the deep white matter on the left consistent with acute infarct.  This involves some of the posterior putamen and the posterior internal capsule and external capsule fibers.  No other acute infarct.  Generalized atrophy is present.  Chronic microvascular ischemic changes are present in the cerebral white matter bilaterally. Chronic lacunar infarction  in the left pons.  Mild chronic microvascular ischemia in the pons.  Small chronic infarct left cerebellum.  Negative for acute hemorrhage.  Negative for mass lesion. Ventricle size is commensurate with the level of atrophy.  IMPRESSION: Deep white matter infarct on the left.  Atrophy and moderate chronic microvascular ischemia.  MRA HEAD  Findings: The vertebral arteries are patent to the basilar.  PICA is patent bilaterally.  The basilar  is widely patent.  Superior cerebellar and posterior cerebral arteries are patent bilaterally.  Cavernous carotid shows mild irregularity and atherosclerotic disease without significant stenosis.  The anterior and middle cerebral arteries are patent bilaterally without stenosis.  Negative for aneurysm.  IMPRESSION: Mild atherosclerotic disease in the cavernous carotid bilaterally. No significant intracranial stenosis.   Original Report Authenticated By: Janeece Riggers, M.D.   Mr Brain Wo Contrast  04/12/2013   *RADIOLOGY REPORT*  Clinical Data:  Right-sided numbness.  CVA  MRI HEAD WITHOUT CONTRAST MRA HEAD WITHOUT CONTRAST  Technique:  Multiplanar, multiecho pulse sequences of the brain and surrounding structures were obtained without intravenous contrast. Angiographic images of the head were obtained using MRA technique without contrast.  Comparison:  CT 04/12/2013  MRI HEAD  Findings:  1 cm area of restricted diffusion in the deep white matter on the left consistent with acute infarct.  This involves some of the posterior putamen and the posterior internal capsule and external capsule fibers.  No other acute infarct.  Generalized atrophy is present.  Chronic microvascular ischemic changes are present in the cerebral white matter bilaterally. Chronic lacunar infarction in the left pons.  Mild chronic microvascular ischemia in the pons.  Small chronic infarct left cerebellum.  Negative for acute hemorrhage.  Negative for mass lesion. Ventricle size is commensurate with the level  of atrophy.  IMPRESSION: Deep white matter infarct on the left.  Atrophy and moderate chronic microvascular ischemia.  MRA HEAD  Findings: The vertebral arteries are patent to the basilar.  PICA is patent bilaterally.  The basilar is widely patent.  Superior cerebellar and posterior cerebral arteries are patent bilaterally.  Cavernous carotid shows mild irregularity and atherosclerotic disease without significant stenosis.  The anterior and middle cerebral arteries are patent bilaterally without stenosis.  Negative for aneurysm.  IMPRESSION: Mild atherosclerotic disease in the cavernous carotid bilaterally. No significant intracranial stenosis.   Original Report Authenticated By: Janeece Riggers, M.D.   Medications: Scheduled Meds: . aspirin  300 mg Rectal Daily   Or  . aspirin  325 mg Oral Daily  . brinzolamide  1 drop Left Eye q morning - 10a  . cholecalciferol  1,000 Units Oral Daily  . enoxaparin (LOVENOX) injection  40 mg Subcutaneous Q24H  . ezetimibe  10 mg Oral Daily  . fluticasone  2 puff Inhalation BID  . insulin aspart  0-5 Units Subcutaneous QHS  . insulin aspart  0-9 Units Subcutaneous TID WC  . tiotropium  18 mcg Inhalation Daily  . Travoprost (BAK Free)  1 drop Both Eyes QHS   Continuous Infusions:  PRN Meds:.acetaminophen, acetaminophen, albuterol, senna-docusate  Assessment/Plan: Principal Problem:   CVA (cerebral infarction) Active Problems:   COPD (chronic obstructive pulmonary disease)   CAD (coronary artery disease), native coronary artery   Weakness generalized   Abdominal aneurysm without mention of rupture   Diabetes mellitus, type 2   Hyperlipidemia   Thyroid disease  1. Acute Left brain ischemic stroke : 1 centimeter left brain deep white matter stroke.  Minimal deficits currently.  Start physical therapy and occupational therapy and plan for home PTOT perhaps starting next week with discharge this weekend if stable 2. Type II DM: CBGs under good  control 3. Hyperlipidemia: Check fasting lipids. Continue Zetia.  Does not tolerate statins 4. History of CAD: Asymptomatic. Continue aspirin 5. COPD: Stable. Not on home oxygen. Continue home medications and when necessary bronchodilators. 6. Generalized weakness: Unclear etiology. Ongoing for last 1 week.  May have  been prodrome prior to stroke 7. AAA, status post endovascular stent: Continue aspirin. 8. Elevated hemoglobin/? Polycythemia: Status post IV fluids.  CBC pending. 9. Thyroid disease: Check TSH and free T4 today 10. Disposition - hopefully home with home PT OT tomorrow or the next day   LOS: 1 day   Pearla Dubonnet, MD 04/13/2013, 8:04 AM

## 2013-04-13 NOTE — Evaluation (Signed)
Physical Therapy Evaluation Patient Details Name: Joseph [name] MRN: [id number] DOB: August 21, 1930 Today's Date: 04/13/2013 Time: 1132-1203 PT Time Calculation (min): 31 min  PT Assessment / Plan / Recommendation History of Present Illness  Joseph [name] is Joseph 79 y.o. male Joseph history of hypercholesterolemia, diabetes who presents with right arm numbness that started this morning. His daughter states that possibly since Tuesday he has had some mild slurred speech, though this is not clear. Today he has had mild slurred speech and left arm numbness which is not improving and therefore he came into the ER. There on MRI showed Joseph subcortical infarction on the left .  Clinical Impression  Presents to PT today with generalized weakness and balance impairments impacting functional independence. Will benefit physical therapy in the acute setting to maximize safety for d/c home. Rec. HHPT f/u for continue balance and strength training.     PT Assessment  Patient needs continued PT services    Follow Up Recommendations  Home health PT;Supervision for mobility/OOB    Does the patient have the potential to tolerate intense rehabilitation      Barriers to Discharge Decreased caregiver support      Equipment Recommendations  None recommended by PT    Recommendations for Other Services     Frequency Min 4X/week    Precautions / Restrictions Precautions Precautions: Fall Restrictions Weight Bearing Restrictions: No   Pertinent Vitals/Pain Denies pain     Mobility  Bed Mobility Bed Mobility: Not assessed Supine to Sit: 6: Modified independent (Device/Increase time) Transfers Transfers: Sit to Stand;Stand to Sit Sit to Stand: 5: Supervision;From bed;With upper extremity assist Stand to Sit: 5: Supervision;To chair/3-in-1;With upper extremity assist Details for Transfer Assistance: cues for safety Ambulation/Gait Ambulation/Gait Assistance: 4: Min guard Ambulation Distance (Feet): 500  Feet Assistive device: Rolling walker;None Ambulation/Gait Assistance Details: 300 ft without AD to perform balance test (see DGI below); gaurding for stability as pt with increased postural sway; ambulated 200 ft with RW and balance/confidence improved Gait Pattern: Narrow base of support;Decreased stride length;Decreased trunk rotation General Gait Details: decreased step heigh bilaterally; fatigued quickly needing to sit x2 because of getting out of breath and feeling like he is going to "go down" Stairs: Yes Stairs Assistance: 4: Min guard Stair Management Technique: Step to pattern;No rails Number of Stairs: 10 Wheelchair Mobility Wheelchair Mobility: No Modified Rankin (Stroke Patients Only) Pre-Morbid Rankin Score: No symptoms Modified Rankin: Moderate disability    Exercises     PT Diagnosis: Difficulty walking;Generalized weakness  PT Problem List: Decreased strength;Decreased activity tolerance;Decreased balance;Decreased knowledge of use of DME PT Treatment Interventions: DME instruction;Gait training;Functional mobility training;Therapeutic activities;Therapeutic exercise;Balance training;Neuromuscular re-education;Patient/family education;Stair training     PT Goals(Current goals can be found in the care plan section) Acute Rehab PT Goals Patient Stated Goal: home, independent PT Goal Formulation: With patient Time For Goal Achievement: 04/20/13 Potential to Achieve Goals: Good  Visit Information  Last PT Received On: 04/13/13 Assistance Needed: +1 PT/OT Co-Evaluation/Treatment: Yes History of Present Illness: Joseph [name] is Joseph 79 y.o. male Joseph history of hypercholesterolemia, diabetes who presents with right arm numbness that started this morning. His daughter states that possibly since Tuesday he has had some mild slurred speech, though this is not clear. Today he has had mild slurred speech and left arm numbness which is not improving and therefore he came into the  ER. There on MRI showed Joseph subcortical infarction on the left .  Prior Functioning  Home Living Family/patient expects to be discharged to:: Private residence Living Arrangements: Spouse/significant other Available Help at Discharge: Family Type of Home: House Home Access: Stairs to enter Secretary/administrator of Steps: 2 Entrance Stairs-Rails: Right Home Layout: One level Home Equipment: Environmental consultant - 2 wheels;Shower seat;Cane - single point  Lives With: Spouse Prior Function Level of Independence: Independent Comments: reports his wife isi able to manage her ADLs and cooking however she is unable to drive and run errands Communication Communication: No difficulties Dominant Hand: Right    Cognition  Cognition Arousal/Alertness: Awake/alert Behavior During Therapy: WFL for tasks assessed/performed Overall Cognitive Status: Within Functional Limits for tasks assessed    Extremity/Trunk Assessment Upper Extremity Assessment Upper Extremity Assessment: RUE deficits/detail RUE Deficits / Details: AROM WFL for shoulder RUE Sensation: decreased light touch Lower Extremity Assessment Lower Extremity Assessment: Defer to PT evaluation Cervical / Trunk Assessment Cervical / Trunk Assessment: Normal   Balance Balance Balance Assessed: Yes Static Standing Balance Static Standing - Level of Assistance: 5: Stand by assistance Static Standing - Comment/# of Minutes: increased postural sway with Romberg, needs closer gaurding, and patient easily fatigued needing to sit after attempting Rhomberg - Eyes Opened: 60 Rhomberg - Eyes Closed: 30 Standardized Balance Assessment Standardized Balance Assessment: Dynamic Gait Index Dynamic Gait Index Level Surface: Normal Change in Gait Speed: Mild Impairment Gait with Horizontal Head Turns: Mild Impairment Gait with Vertical Head Turns: Mild Impairment Step Over Obstacle: Moderate Impairment Step Around Obstacles: Mild Impairment Steps:  Moderate Impairment  End of Session PT - End of Session Equipment Utilized During Treatment: Gait belt Activity Tolerance: Patient tolerated treatment well Patient left: in chair;with call bell/phone within reach Nurse Communication: Mobility status  GP     [facility] HELEN 04/13/2013, 1:16 PM

## 2013-04-13 NOTE — Progress Notes (Signed)
*  PRELIMINARY RESULTS* Echocardiogram 2D Echocardiogram has been performed.  Joseph [name] 04/13/2013, 8:44 AM

## 2013-04-13 NOTE — Progress Notes (Signed)
Stroke Team Progress Note  HISTORY Joseph [name] is a 79 y.o. male a history of hypercholesterolemia, diabetes who presents with right arm numbness that started this morning. His daughter states that possibly since Tuesday he has had some mild slurred speech, though this is not clear. Today he has had mild slurred speech and left arm numbness which is not improving and therefore he came into the ER. There on MRI showed a subcortical infarction on the left  NIHSS 3. Patient was not a TPA candidate secondary to delay in arrival.. He was admitted  for further evaluation and treatment.  SUBJECTIVE No family is at the bedside.  Overall he feels his condition is stable.   OBJECTIVE Most recent Vital Signs: Filed Vitals:   04/13/13 0220 04/13/13 0612 04/13/13 0856 04/13/13 1024  BP: 132/66 129/74  117/64  Pulse: 85 83  82  Temp: 97.6 F (36.4 C) 97.4 F (36.3 C)  98.1 F (36.7 C)  TempSrc: Oral Oral  Oral  Resp: 20 20  18   Height:      Weight:      SpO2: 98% 97% 97% 95%   CBG (last 3)   Recent Labs  04/12/13 1739 04/12/13 2209 04/13/13 0707  GLUCAP 156* 120* 115*    IV Fluid Intake:     MEDICATIONS  . aspirin  300 mg Rectal Daily   Or  . aspirin  325 mg Oral Daily  . brinzolamide  1 drop Left Eye q morning - 10a  . cholecalciferol  1,000 Units Oral Daily  . enoxaparin (LOVENOX) injection  40 mg Subcutaneous Q24H  . ezetimibe  10 mg Oral Daily  . fluticasone  2 puff Inhalation BID  . insulin aspart  0-5 Units Subcutaneous QHS  . insulin aspart  0-9 Units Subcutaneous TID WC  . tiotropium  18 mcg Inhalation Daily  . Travoprost (BAK Free)  1 drop Both Eyes QHS   PRN:  acetaminophen, acetaminophen, albuterol, senna-docusate  Diet:  Carb Control thin liquids Activity:  OOB with assistance DVT Prophylaxis:  Lovenox 40 mg sq daily   CLINICALLY SIGNIFICANT STUDIES Basic Metabolic Panel:  Recent Labs Lab 04/12/13 0956  NA 135  K 4.2  CL 97  CO2 26  GLUCOSE 128*   BUN 14  CREATININE 0.81  CALCIUM 9.9   Liver Function Tests: No results found for this basename: AST, ALT, ALKPHOS, BILITOT, PROT, ALBUMIN,  in the last 168 hours CBC:  Recent Labs Lab 04/12/13 0956 04/13/13 1000  WBC 9.5 7.2  NEUTROABS 6.6 4.7  HGB 17.3* 15.9  HCT 49.3 46.8  MCV 91.8 92.1  PLT 263 253   Coagulation: No results found for this basename: LABPROT, INR,  in the last 168 hours Cardiac Enzymes: No results found for this basename: CKTOTAL, CKMB, CKMBINDEX, TROPONINI,  in the last 168 hours Urinalysis:  Recent Labs Lab 04/12/13 1236  COLORURINE YELLOW  LABSPEC 1.021  PHURINE 5.5  GLUCOSEU NEGATIVE  HGBUR SMALL*  BILIRUBINUR NEGATIVE  KETONESUR NEGATIVE  PROTEINUR NEGATIVE  UROBILINOGEN 1.0  NITRITE NEGATIVE  LEUKOCYTESUR NEGATIVE   Lipid Panel    Component Value Date/Time   CHOL 149 04/13/2013 0555   TRIG 90 04/13/2013 0555   HDL 39* 04/13/2013 0555   CHOLHDL 3.8 04/13/2013 0555   VLDL 18 04/13/2013 0555   LDLCALC 92 04/13/2013 0555   HgbA1C  No results found for this basename: HGBA1C    Urine Drug Screen:   No results found for this basename: labopia,  cocainscrnur, labbenz, amphetmu, thcu, labbarb    Alcohol Level: No results found for this basename: ETH,  in the last 168 hours  CT of the brain  04/12/2013    brain atrophy and chronic microvascular ischemic changes of the white matter.  Remote basal ganglia lacunar infarcts.  No acute process by noncontrast CT.     MRI of the brain  04/12/2013    Deep white matter infarct on the left.  Atrophy and moderate chronic microvascular ischemia.    MRA of the brain  04/12/2013   Mild atherosclerotic disease in the cavernous carotid bilaterally. No significant intracranial stenosis.  2D Echocardiogram    Carotid Doppler    CXR  04/12/2013    Emphysematous and chronic bronchitic changes in the chest.  No evidence of active pulmonary disease.    EKG  sinus tachycardia.   Therapy Recommendations   Physical  Exam   Pleasant elderly caucasian male not in distress.Awake alert. Afebrile. Head is nontraumatic. Neck is supple without bruit. Hearing is normal. Cardiac exam no murmur or gallop. Lungs are clear to auscultation. Distal pulses are well felt. Neurological exam ;  ;  Awake  Alert oriented x 3. Normal speech and language.eye movements full without nystagmus.fundi were not visualized. Vision acuity and fields appear normal. Hearing is normal. Palatal movements are normal. Face symmetric. Tongue midline. Normal strength, tone, reflexes and coordination.diminished fine finger movements on right side and orbits left over right upper extremity Normal sensation. Gait deferred. ASSESSMENT Joseph [name] is a 79 y.o. male presenting with right arm numbness. Imaging confirms a left deep white matter infarct. Infarct felt to be thrombotic secondary to small vessel disease.  On aspirin 162 mg daily prior to admission. Now on aspirin 325 mg orally every day for secondary stroke prevention. Patient with resultant right hemisensory deficit that is improving. Work up underway.  Diabetes, HgbA1c pending, goal < 7.0 Hyperlipidemia, LDL 92, on setia PTA, now on zetia, goal LDL < 100 (< 70 for diabetics) - patient is intolerant to statins Hx MI  Hospital day # 1  TREATMENT/PLAN  Change aspirin to clopidogrel 75 mg orally every day for secondary stroke prevention.  Complete stroke workup  Therapy evals - rehab as per their recs  Annie Main, MSN, RN, ANVP-BC, ANP-BC, GNP-BC [facility] Pager: [phone] 04/13/2013 11:12 AM  I have personally obtained a history, examined the patient, evaluated imaging results, and formulated the assessment and plan of care. I agree with the above. Delia Heady, MD

## 2013-04-13 NOTE — Evaluation (Signed)
Occupational Therapy Evaluation Patient Details Name: Joseph [name] MRN: [id number] DOB: 1930-01-15 Today's Date: 04/13/2013 Time: 4098-1191 OT Time Calculation (min): 18 min  OT Assessment / Plan / Recommendation History of present illness Joseph [name] is a 79 y.o. male a history of hypercholesterolemia, diabetes who presents with right arm numbness that started this morning. His daughter states that possibly since Tuesday he has had some mild slurred speech, though this is not clear. Today he has had mild slurred speech and left arm numbness which is not improving and therefore he came into the ER. There on MRI showed a subcortical infarction on the left .   Clinical Impression   PT admitted with new slurred speech and right side weakness. Pt currently with functional limitiations due to the deficits listed below (see OT problem list).  Ot to sign off acutely.     OT Assessment  Patient does not need any further OT services    Follow Up Recommendations  No OT follow up    Barriers to Discharge      Equipment Recommendations  None recommended by OT    Recommendations for Other Services    Frequency       Precautions / Restrictions Precautions Precautions: Fall Restrictions Weight Bearing Restrictions: No   Pertinent Vitals/Pain None reported    ADL  Eating/Feeding: Modified independent Where Assessed - Eating/Feeding: Chair Grooming: Wash/dry face;Modified independent Where Assessed - Grooming: Unsupported standing Upper Body Bathing: Chest;Right arm;Left arm;Abdomen;Modified independent Where Assessed - Upper Body Bathing: Unsupported sit to stand Lower Body Bathing: Modified independent Where Assessed - Lower Body Bathing: Unsupported sit to stand Toilet Transfer: Modified independent Toilet Transfer Method: Sit to Barista: Regular height toilet Toileting - Clothing Manipulation and Hygiene: Modified independent Where Assessed -  Toileting Clothing Manipulation and Hygiene: Sit to stand from 3-in-1 or toilet Tub/Shower Transfer: Modified independent Tub/Shower Transfer Method: Ambulating Equipment Used: Gait belt Transfers/Ambulation Related to ADLs: Pt ambulating with min guard (A). pt reports dizziness with occulsion of vision.  ADL Comments: Pt is at or near baseline. Pt reports fatigue and requesting to sit appropriately. Pt opening lunch tray and able to setup tray without (A). pt complete tub transfer without (A).     OT Diagnosis:    OT Problem List:   OT Treatment Interventions:     OT Goals(Current goals can be found in the care plan section)    Visit Information  Last OT Received On: 04/13/13 Assistance Needed: +1 PT/OT Co-Evaluation/Treatment: Yes History of Present Illness: Joseph [name] is a 79 y.o. male a history of hypercholesterolemia, diabetes who presents with right arm numbness that started this morning. His daughter states that possibly since Tuesday he has had some mild slurred speech, though this is not clear. Today he has had mild slurred speech and left arm numbness which is not improving and therefore he came into the ER. There on MRI showed a subcortical infarction on the left .       Prior Functioning     Home Living Family/patient expects to be discharged to:: Private residence Living Arrangements: Spouse/significant other Available Help at Discharge: Family Type of Home: House Home Access: Stairs to enter Secretary/administrator of Steps: 2 Entrance Stairs-Rails: Right Home Layout: One level Home Equipment: Environmental consultant - 2 wheels;Shower seat;Cane - single point  Lives With: Spouse Prior Function Level of Independence: Independent Comments: reports his wife isi able to manage her ADLs and cooking however she is unable to  drive and run errands Communication Communication: No difficulties Dominant Hand: Right         Vision/Perception     Cognition   Cognition Arousal/Alertness: Awake/alert Behavior During Therapy: WFL for tasks assessed/performed Overall Cognitive Status: Within Functional Limits for tasks assessed    Extremity/Trunk Assessment Upper Extremity Assessment Upper Extremity Assessment: RUE deficits/detail RUE Deficits / Details: AROM WFL for shoulder RUE Sensation: decreased light touch Lower Extremity Assessment Lower Extremity Assessment: Defer to PT evaluation Cervical / Trunk Assessment Cervical / Trunk Assessment: Normal     Mobility Bed Mobility Bed Mobility: Not assessed Supine to Sit: 6: Modified independent (Device/Increase time) Transfers Sit to Stand: 5: Supervision;From bed;With upper extremity assist Stand to Sit: 5: Supervision;To chair/3-in-1;With upper extremity assist Details for Transfer Assistance: cues for safety     Exercise     Balance Balance Balance Assessed: Yes Standardized Balance Assessment Standardized Balance Assessment: Berg Balance Test Dynamic Gait Index Level Surface: Normal Change in Gait Speed: Mild Impairment Gait with Horizontal Head Turns: Mild Impairment Gait with Vertical Head Turns: Mild Impairment Step Over Obstacle: Moderate Impairment Step Around Obstacles: Mild Impairment   End of Session OT - End of Session Activity Tolerance: Patient tolerated treatment well Patient left: in chair;with call bell/phone within reach Nurse Communication: Mobility status;Precautions  GO     Lucile Shutters 04/13/2013, 1:11 PM Pager: [phone]

## 2013-04-13 NOTE — Evaluation (Signed)
Speech Language Pathology Evaluation Patient Details Name: Joseph [name] MRN: [id number] DOB: [birth year] Today's Date: 04/13/2013 Time: 4098-1191 SLP Time Calculation (min): 13 min  Problem List:  Patient Active Problem List   Diagnosis Date Noted  . CVA (cerebral infarction) 04/12/2013  . Diabetes mellitus, type 2   . Hyperlipidemia   . Thyroid disease   . Abdominal aneurysm without mention of rupture 12/01/2011  . Hypotension, unspecified 10/03/2011    Class: Acute  . Chronic respiratory failure 10/03/2011    Class: Chronic  . Weakness generalized 09/27/2011  . COPD (chronic obstructive pulmonary disease) 09/19/2011  . Acute-on-chronic respiratory failure 09/19/2011  . CAD (coronary artery disease), native coronary artery 09/19/2011  . Peripheral vascular disease 09/19/2011   Past Medical History:  Past Medical History  Diagnosis Date  . Diabetes mellitus, type 2   . Hyperlipidemia   . Myocardial infarction   . COPD (chronic obstructive pulmonary disease)   . Emphysema of lung   . Abdominal aneurysm without mention of rupture   . Thyroid disease     Hyperthyroidism, Goiter  . Hypotension    Past Surgical History:  Past Surgical History  Procedure Laterality Date  . Hernia repair      LEFT INGUINAL  . Abdominal aorta stent      ENDOSTENT REPAIR 10/15/2010  . Abdominal aortic aneurysm repair  05/26/11    PEVAR  . Prostate surgery  Sept. 2012   HPI:  Joseph [name] is a 79 y.o. male a history of hypercholesterolemia, diabetes who presents with right arm numbness that started this morning. His daughter states that possibly since Tuesday he has had some mild slurred speech, though this is not clear. Today he has had mild slurred speech and left arm numbness which is not improving and therefore he came into the ER. There on MRI showed a subcortical infarction on the left   Assessment / Plan / Recommendation Clinical Impression  Pt presents with adequate cognitive  linguistic function for return home. He verbalizes safety awareness, problem solving, short term memory and reasoning. Basic functional problem solving also adequate. Overall linguistic function normal, one episode of word finding, but not limiting. Speech also fully intelligible, pt does not feel it is impaired. If there is any slurring, it is minimal. No SLP f/u warranted. Offered basic word finding strategies. Will sign off.     SLP Assessment  Patient does not need any further Speech Lanaguage Pathology Services    Follow Up Recommendations       Frequency and Duration        Pertinent Vitals/Pain NA   SLP Goals     SLP Evaluation Prior Functioning  Cognitive/Linguistic Baseline: Within functional limits  Lives With: Spouse Vocation: Retired   IT consultant  Overall Cognitive Status: Within Functional Limits for tasks assessed Arousal/Alertness: Awake/alert Orientation Level: Oriented X4 Attention: Alternating Alternating Attention: Appears intact Memory: Appears intact Awareness: Appears intact Problem Solving: Appears intact Executive Function: Reasoning;Sequencing;Organizing;Initiating;Self Monitoring Reasoning: Appears intact Sequencing: Appears intact Organizing: Appears intact Initiating: Appears intact Self Monitoring: Appears intact Safety/Judgment: Appears intact    Comprehension  Auditory Comprehension Overall Auditory Comprehension: Appears within functional limits for tasks assessed    Expression Verbal Expression Overall Verbal Expression: Appears within functional limits for tasks assessed Written Expression Dominant Hand: Right   Oral / Motor Oral Motor/Sensory Function Overall Oral Motor/Sensory Function: Appears within functional limits for tasks assessed Motor Speech Overall Motor Speech: Appears within functional limits for tasks assessed  GO    [name], MA CCC-SLP [phone]  Claudine Mouton 04/13/2013, 9:55 AM

## 2013-04-14 DIAGNOSIS — J449 Chronic obstructive pulmonary disease, unspecified: Secondary | ICD-10-CM | POA: Diagnosis not present

## 2013-04-14 DIAGNOSIS — E119 Type 2 diabetes mellitus without complications: Secondary | ICD-10-CM | POA: Diagnosis not present

## 2013-04-14 DIAGNOSIS — I2510 Atherosclerotic heart disease of native coronary artery without angina pectoris: Secondary | ICD-10-CM | POA: Diagnosis not present

## 2013-04-14 DIAGNOSIS — I6789 Other cerebrovascular disease: Secondary | ICD-10-CM | POA: Diagnosis not present

## 2013-04-14 DIAGNOSIS — I6350 Cerebral infarction due to unspecified occlusion or stenosis of unspecified cerebral artery: Secondary | ICD-10-CM | POA: Diagnosis not present

## 2013-04-14 LAB — GLUCOSE, CAPILLARY: Glucose-Capillary: 168 mg/dL — ABNORMAL HIGH (ref 70–99)

## 2013-04-14 MED ORDER — STROKE: EARLY STAGES OF RECOVERY BOOK
Freq: Once | Status: DC
  Filled 2013-04-14: qty 1

## 2013-04-14 MED ORDER — CLOPIDOGREL BISULFATE 75 MG PO TABS: 75.0000 mg | ORAL_TABLET | Freq: Every day | ORAL | Status: AC

## 2013-04-14 NOTE — Progress Notes (Signed)
Stroke Team Progress Note  HISTORY Joseph [name] is an 79 y.o. male a history of hypercholesterolemia and diabetes who presents with right arm numbness that started on the morning of 04/12/2013. His daughter stated that possibly since Tuesday he had experienced some mild slurred speech, though this was not clear. On admission he had mild slurred speech and left arm numbness which was not improving and therefore he came into the ER. There an MRI showed a subcortical infarction on the left  NIHSS 3. Patient was not a TPA candidate secondary to delay in arrival.. He was admitted  for further evaluation and treatment.  SUBJECTIVE The patient's wife and 2 granddaughters are in the room this morning. The patient is anxious for discharge. He feels he is almost back to baseline but still feels somewhat weak on the right.  OBJECTIVE Most recent Vital Signs: Filed Vitals:   04/13/13 2148 04/14/13 0147 04/14/13 0622 04/14/13 0829  BP: 111/59 99/63 126/69   Pulse: 79 75 82   Temp: 97.5 F (36.4 C) 97.4 F (36.3 C) 97.3 F (36.3 C)   TempSrc: Oral Oral Oral   Resp: 18 18    Height:      Weight:      SpO2: 97% 95% 93% 94%   CBG (last 3)   Recent Labs  04/13/13 2144 04/14/13 0646 04/14/13 1009  GLUCAP 151* 143* 168*    IV Fluid Intake:     MEDICATIONS  . brinzolamide  1 drop Left Eye q morning - 10a  . cholecalciferol  1,000 Units Oral Daily  . clopidogrel  75 mg Oral Q breakfast  . enoxaparin (LOVENOX) injection  40 mg Subcutaneous Q24H  . ezetimibe  10 mg Oral Daily  . fluticasone  2 puff Inhalation BID  . insulin aspart  0-5 Units Subcutaneous QHS  . insulin aspart  0-9 Units Subcutaneous TID WC  . tiotropium  18 mcg Inhalation Daily  . Travoprost (BAK Free)  1 drop Both Eyes QHS   PRN:  acetaminophen, acetaminophen, albuterol, senna-docusate  Diet:  Carb Control thin liquids Activity:  OOB with assistance DVT Prophylaxis:  Lovenox 40 mg sq daily   CLINICALLY SIGNIFICANT  STUDIES Basic Metabolic Panel:   Recent Labs Lab 04/12/13 0956 04/13/13 1000  NA 135 135  K 4.2 4.0  CL 97 98  CO2 26 28  GLUCOSE 128* 158*  BUN 14 14  CREATININE 0.81 0.82  CALCIUM 9.9 9.6   Liver Function Tests: No results found for this basename: AST, ALT, ALKPHOS, BILITOT, PROT, ALBUMIN,  in the last 168 hours CBC:   Recent Labs Lab 04/12/13 0956 04/13/13 1000  WBC 9.5 7.2  NEUTROABS 6.6 4.7  HGB 17.3* 15.9  HCT 49.3 46.8  MCV 91.8 92.1  PLT 263 253   Coagulation: No results found for this basename: LABPROT, INR,  in the last 168 hours Cardiac Enzymes: No results found for this basename: CKTOTAL, CKMB, CKMBINDEX, TROPONINI,  in the last 168 hours Urinalysis:   Recent Labs Lab 04/12/13 1236  COLORURINE YELLOW  LABSPEC 1.021  PHURINE 5.5  GLUCOSEU NEGATIVE  HGBUR SMALL*  BILIRUBINUR NEGATIVE  KETONESUR NEGATIVE  PROTEINUR NEGATIVE  UROBILINOGEN 1.0  NITRITE NEGATIVE  LEUKOCYTESUR NEGATIVE   Lipid Panel    Component Value Date/Time   CHOL 149 04/13/2013 0555   TRIG 90 04/13/2013 0555   HDL 39* 04/13/2013 0555   CHOLHDL 3.8 04/13/2013 0555   VLDL 18 04/13/2013 0555   LDLCALC 92 04/13/2013 0555  HgbA1C  Lab Results  Component Value Date   HGBA1C 6.5* 04/13/2013    Urine Drug Screen:   No results found for this basename: labopia,  cocainscrnur,  labbenz,  amphetmu,  thcu,  labbarb    Alcohol Level: No results found for this basename: ETH,  in the last 168 hours  CT of the brain  04/12/2013    brain atrophy and chronic microvascular ischemic changes of the white matter.  Remote basal ganglia lacunar infarcts.  No acute process by noncontrast CT.     MRI of the brain  04/12/2013    Deep white matter infarct on the left.  Atrophy and moderate chronic microvascular ischemia.    MRA of the brain  04/12/2013   Mild atherosclerotic disease in the cavernous carotid bilaterally. No significant intracranial stenosis.  2D Echocardiogram   Study  Conclusions  Left ventricle: The cavity size was normal. Systolic function was mildly to moderately reduced. The estimated ejection fraction was in the range of 40% to 45%. There was an increased relative contribution of atrial contraction to ventricular filling. Doppler parameters are consistent with abnormal left ventricular relaxation (grade 1 diastolic    Carotid Doppler  Preliminary report: There is 0-39% ICA stenosis. Vertebral artery flow is antegrade.  CXR  04/12/2013    Emphysematous and chronic bronchitic changes in the chest.  No evidence of active pulmonary disease.    EKG  sinus tachycardia.   Therapy Recommendations - home health physical therapy recommended.  Physical Exam   Pleasant elderly caucasian male not in distress.Awake alert. Afebrile. Head is nontraumatic. Neck is supple without bruit. Hearing is normal. Cardiac exam no murmur or gallop. Lungs are clear to auscultation. Distal pulses are well felt. Neurological exam ;  ;  Awake  Alert oriented x 3. Normal speech and language.eye movements full without nystagmus.fundi were not visualized. Vision acuity and fields appear normal. Hearing is normal. Palatal movements are normal. Face symmetric. Tongue midline. Slightly diminished strength and sensation to light touch in both the right upper and lower extremities.  Gait deferred.  ASSESSMENT Mr. Joseph [name] is a 79 y.o. male presenting with right arm numbness. Imaging confirms a left deep white matter infarct. Infarct felt to be thrombotic secondary to small vessel disease.  On aspirin 162 mg daily prior to admission. Now on aspirin 325 mg orally every day for secondary stroke prevention. Patient with resultant right hemisensory deficit that is improving. Work up underway.  Diabetes, HgbA1c pending, goal < 7.0 Hyperlipidemia, LDL 92, on setia PTA, now on zetia, goal LDL < 100 (< 70 for diabetics) - patient is intolerant to statins Hx MI  Hospital day #  2  TREATMENT/PLAN  Change aspirin to clopidogrel 75 mg orally every day for secondary stroke prevention.  stroke workup has been completed. OK for discharge home  Therapy evals - home health physical therapy recommended.  Would get Cardionet monitor as outpatient for 3 weeks  Stroke service to sign off.    Delton See PA-C [facility] Pager [phone] 04/14/2013, 11:10 AM  I have personally obtained a history, examined the patient, evaluated imaging results, and formulated the assessment and plan of care. I agree with the above.  Lesly Dukes

## 2013-04-14 NOTE — Discharge Summary (Signed)
Physician Discharge Summary  Patient ID: Joseph [name] MRN: [id number] DOB/AGE: 12-22-1929 79 y.o.  Admit date: 04/12/2013 Discharge date: 04/14/2013  Admission Diagnoses: Acute CVA COPD Coronary artery disease Diabetes mellitus type 2 Abdominal aneurysm Hyperlipidemia   Discharge Diagnoses:  Principal Problem:   Acute CVA (cerebral infarction) Active Problems:   COPD (chronic obstructive pulmonary disease)   CAD (coronary artery disease), native coronary artery   Abdominal aneurysm without mention of rupture   Diabetes mellitus, type 2   Hyperlipidemia   Thyroid disease   Discharged Condition: good  Hospital Course: The patient was admitted on July 2 with complaints of slurred speech and right upper extremity numbness. He speech slurring had started about 48 hours before admission. On the morning of admission the patient Noted tingling and numbness in the entire right upper extremity. He was taken to emergency department and in the ER MRI confirmed the deep white matter infarct on the left. The patient was seen in consultation by the neurology service. He was felt to have probable small vessel stroke. MRA of the brain showed mild atherosclerotic disease in the cavernous carotids bilaterally. No significant stenoses. 2-D echocardiogram showed mild to moderate reduction in systolic function with estimated ejection fraction of 40-45% and grade 1 diastolic dysfunction, no embolic source. Carotid ultrasound showed 0-39% bilateral ICA stenoses, antegrade vertebral artery flow. The patient was seen by physical and occupational therapy, home physical therapy was recommended for balance and gait training, occupational therapy did not recommend further intervention. The patient's slurred speech significantly improved during hospitalization. Home physical therapy was arranged. At the recommendation of neurology the patient was switched from aspirin to Plavix therapy. CardioNet monitor for 3  weeks as outpatient recommended.  Consults: neurology  Significant Diagnostic Studies: labs: TSH 0.65, free T4-1.48, CBC WBC 7.2 hemoglobin 15.9 platelets 2 53,000, sodium 135 potassium 4.0 chloride 98 bicarbonate 28 glucose 158 BUN 14 creatinine 0.82 , sedimentation rate 1, hemoglobin A1c 6.5, lipids cholesterol 149, triglycerides 90, HDL 39, LDL 92 radiology: CXR: air trapping/emphysema, MRI: As above, CT scan: Brain atrophy and chronic microvascular ischemic changes and remote basal ganglier lacunar infarcts and Ultrasound: As above and cardiac graphics: Echocardiogram: As above  Treatments: anticoagulation: Plavix  Discharge Exam: Blood pressure 126/69, pulse 82, temperature 97.3 F (36.3 C), temperature source Oral, resp. rate 18, height 5\' 11"  (1.803 m), weight 66 kg (145 lb 8.1 oz), SpO2 94.00%. General appearance: alert and cooperative Resp: clear to auscultation bilaterally Cardio: regular rate and rhythm, S1, S2 normal, no murmur, click, rub or gallop  Disposition: 03-Skilled Nursing Facility   Future Appointments Provider Department Dept Phone   12/26/2013 8:30 AM [facility] [phone]   Eat a light meal the night before the exam but please avoid gaseous foods.   Nothing to eat or drink for at least 8 hours prior to the exam. No gum chewing or smoking the morning of the exam. Please take your morning medications with small sips of water, especially blood pressure medication. If you have several vascular lab exams and will see physician, please bring a snack with you.   12/26/2013 9:00 AM Chuck Hint, MD [facility] [phone]       Medication List    STOP taking these medications       albuterol (5 MG/ML) 0.5% nebulizer solution  Commonly known as:  PROVENTIL     aspirin 81 MG tablet     ipratropium 0.02 %  nebulizer solution  Commonly known as:  ATROVENT      TAKE these medications        brinzolamide 1 % ophthalmic suspension  Commonly known as:  AZOPT  Place 1 drop into the left eye every morning.     cholecalciferol 1000 UNITS tablet  Commonly known as:  VITAMIN D  Take 1,000 Units by mouth daily.     clopidogrel 75 MG tablet  Commonly known as:  PLAVIX  Take 1 tablet (75 mg total) by mouth daily with breakfast.     ezetimibe 10 MG tablet  Commonly known as:  ZETIA  Take 10 mg by mouth daily.     insulin glargine 100 UNIT/ML injection  Commonly known as:  LANTUS  Inject 10 Units into the skin at bedtime.     metFORMIN 500 MG tablet  Commonly known as:  GLUCOPHAGE  Take 500 mg by mouth 2 (two) times daily with a meal.     PULMICORT FLEXHALER 180 MCG/ACT inhaler  Generic drug:  budesonide  Inhale 1 puff into the lungs 2 (two) times daily.     SPIRIVA HANDIHALER 18 MCG inhalation capsule  Generic drug:  tiotropium  daily.     travoprost (benzalkonium) 0.004 % ophthalmic solution  Commonly known as:  TRAVATAN  Place 1 drop into both eyes at bedtime.           Follow-up Information   Follow up with GATES,ROBERT NEVILL, MD In 10 days.   Contact information:   [address] [place] [address] [phone]       Signed: Lillia Mountain 04/14/2013, 12:28 PM

## 2013-04-14 NOTE — Progress Notes (Addendum)
Pt and family given [facility] booklet.  Information covered and questions answered.  Joseph [name]   Pt also given book: Stroke: Mapping out early stages of recovery Isabel Freese, [facility]

## 2013-04-14 NOTE — Progress Notes (Signed)
   CARE MANAGEMENT NOTE 04/14/2013  Patient:  Joseph [name], Joseph [name]   Account Number:  [id number]  Date Initiated:  04/13/2013  Documentation initiated by:  Jiles Crocker  Subjective/Objective Assessment:   ADMITTED WITH     Action/Plan:   PCP: Pearla Dubonnet, MD  OP Specialists:  Vascular Surgery: Dr. Waverly Ferrari  Cardiology: Dr. Verdis Prime  Urology: Dr. Heloise Purpura;  LIVES AT HOME WITH SPOUSE; CM FOLLOWING FOR DCP   Anticipated DC Date:  04/19/2013   Anticipated DC Plan:  HOME W HOME HEALTH SERVICES      DC Planning Services  CM consult      [facility] Choice  HOME HEALTH   Choice offered to / List presented to:  C-1 Patient        HH arranged  HH-2 PT      [facility] agency  [facility]   Status of service:  In process, will continue to follow Medicare Important Message given?  NA - LOS <3 / Initial given by admissions (If response is "NO", the following Medicare IM given date fields will be blank) Date Medicare IM given:   Date Additional Medicare IM given:    Discharge Disposition:    Per UR Regulation:  Reviewed for med. necessity/level of care/duration of stay  If discussed at Long Length of Stay Meetings, dates discussed:    Comments:  04/14/2013 1300 NCM spoke to pt and offered choice. Pt requested [facility] for HH. Faxed referral to [facility] for [facility] for scheduled dc home today. [facility] contact info added to dc instructions. Pt states no DME is needed. Isidoro Donning RN CCM Case Mgmt phone [phone]  7/11/2014Abelino Derrick RN,BSN,MHA

## 2013-04-18 DIAGNOSIS — I6350 Cerebral infarction due to unspecified occlusion or stenosis of unspecified cerebral artery: Secondary | ICD-10-CM | POA: Diagnosis not present

## 2013-05-14 DIAGNOSIS — Z1331 Encounter for screening for depression: Secondary | ICD-10-CM | POA: Diagnosis not present

## 2013-05-14 DIAGNOSIS — Z0000 Encounter for general adult medical examination without abnormal findings: Secondary | ICD-10-CM | POA: Diagnosis not present

## 2013-05-14 DIAGNOSIS — N400 Enlarged prostate without lower urinary tract symptoms: Secondary | ICD-10-CM | POA: Diagnosis not present

## 2013-05-14 DIAGNOSIS — E119 Type 2 diabetes mellitus without complications: Secondary | ICD-10-CM | POA: Diagnosis not present

## 2013-05-14 DIAGNOSIS — I6350 Cerebral infarction due to unspecified occlusion or stenosis of unspecified cerebral artery: Secondary | ICD-10-CM | POA: Diagnosis not present

## 2013-05-14 DIAGNOSIS — I10 Essential (primary) hypertension: Secondary | ICD-10-CM | POA: Diagnosis not present

## 2013-05-14 DIAGNOSIS — L723 Sebaceous cyst: Secondary | ICD-10-CM | POA: Diagnosis not present

## 2013-05-14 DIAGNOSIS — Z79899 Other long term (current) drug therapy: Secondary | ICD-10-CM | POA: Diagnosis not present

## 2013-05-14 DIAGNOSIS — E782 Mixed hyperlipidemia: Secondary | ICD-10-CM | POA: Diagnosis not present

## 2013-05-14 DIAGNOSIS — E559 Vitamin D deficiency, unspecified: Secondary | ICD-10-CM | POA: Diagnosis not present

## 2013-06-06 DIAGNOSIS — H4011X0 Primary open-angle glaucoma, stage unspecified: Secondary | ICD-10-CM | POA: Diagnosis not present

## 2013-06-06 DIAGNOSIS — H409 Unspecified glaucoma: Secondary | ICD-10-CM | POA: Diagnosis not present

## 2013-06-26 DIAGNOSIS — E0520 Thyrotoxicosis with toxic multinodular goiter without thyrotoxic crisis or storm: Secondary | ICD-10-CM | POA: Diagnosis not present

## 2013-07-17 DIAGNOSIS — Z23 Encounter for immunization: Secondary | ICD-10-CM | POA: Diagnosis not present

## 2013-10-04 IMAGING — CR DG CHEST 1V PORT
2 series · 2 of 2 positions shown · non-contrast
Comparison: 10/15/2010

CLINICAL DATA: Shortness of breath.  Difficulty breathing.

PORTABLE CHEST - 1 VIEW

[AP (1 of 2)]
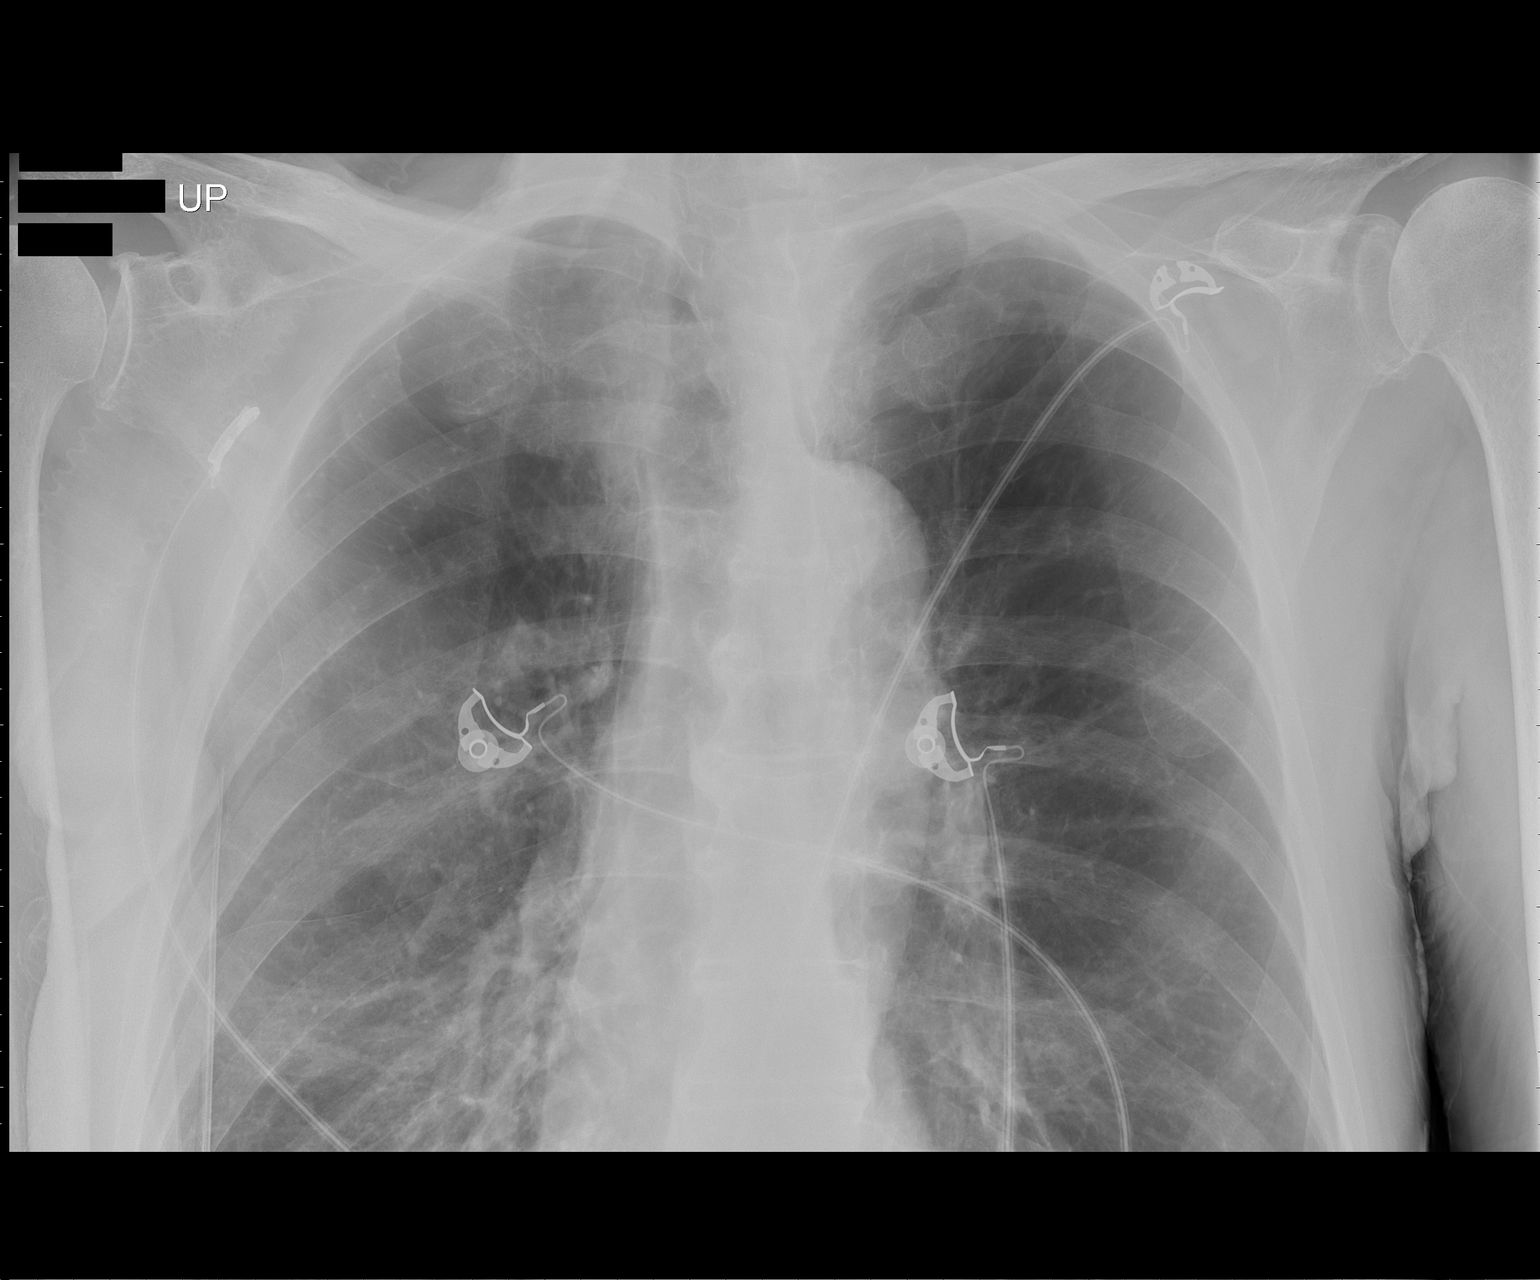

[AP (2 of 2)]
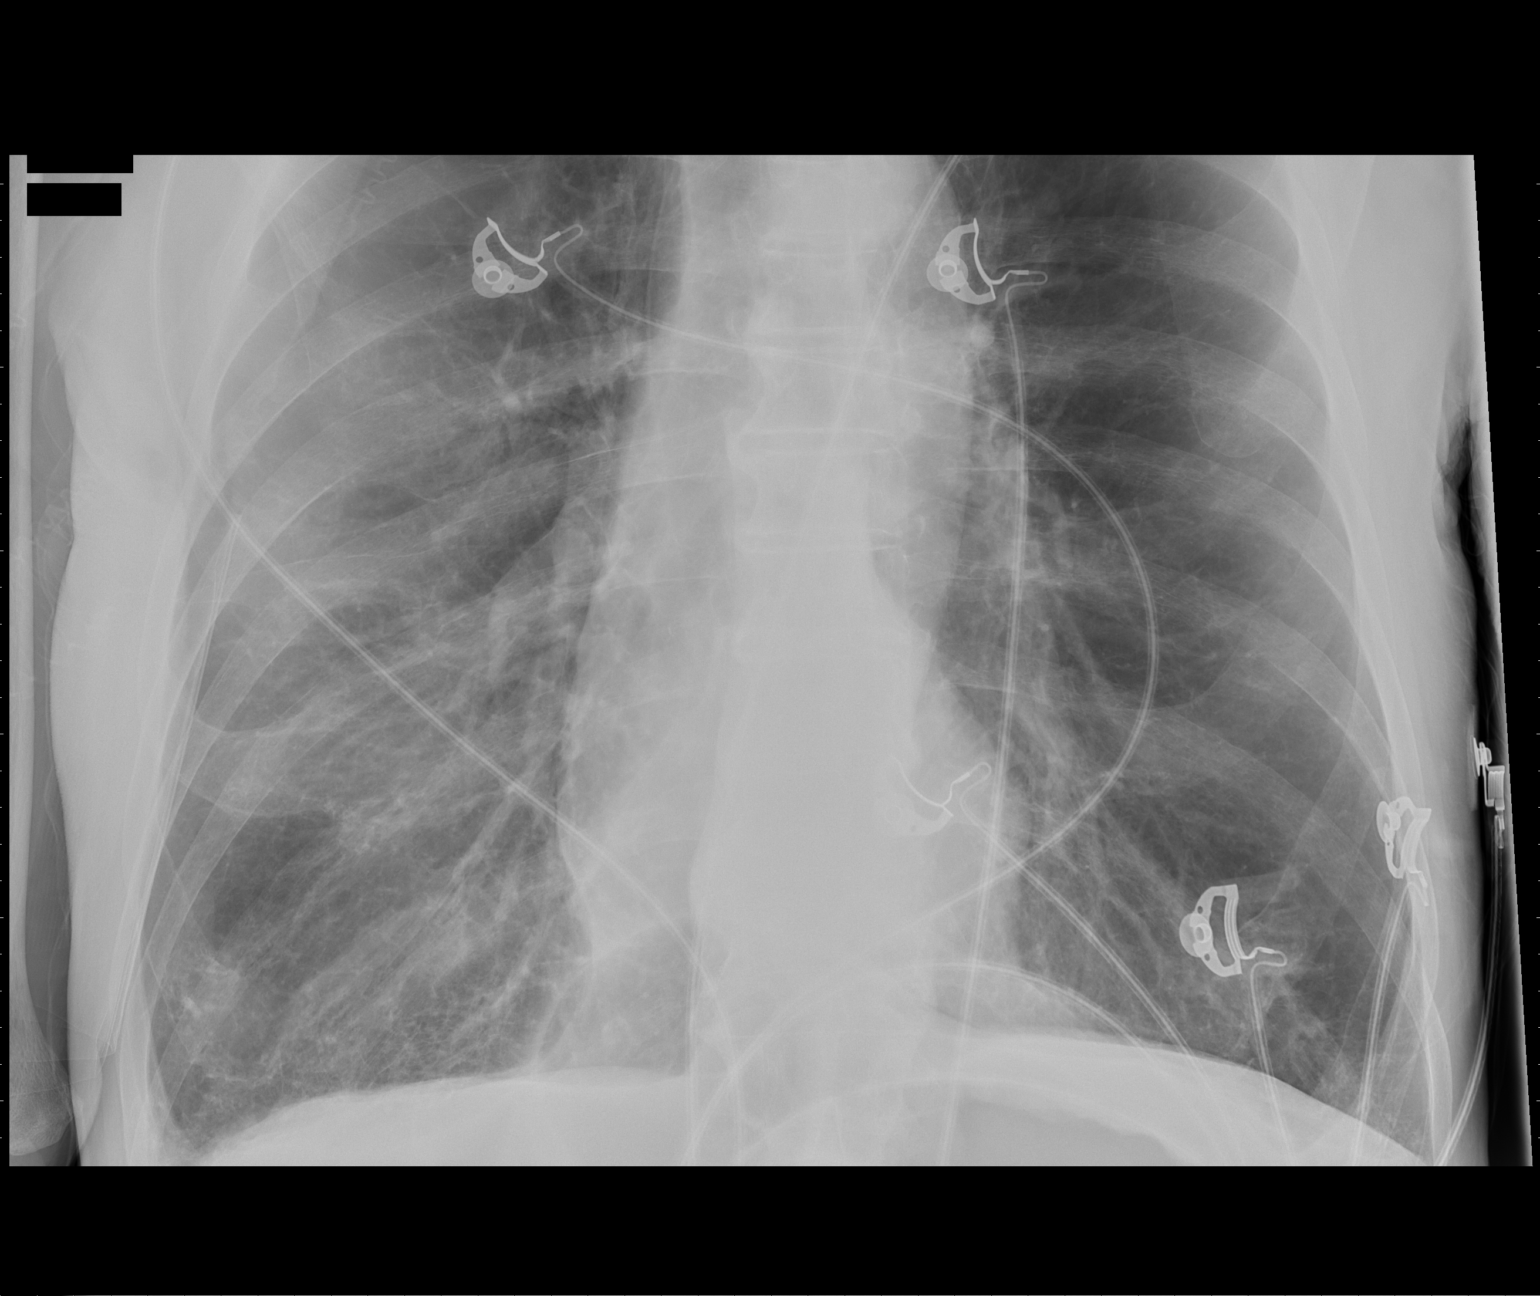

[2 of 2 positions shown; findings below may reference images not displayed]

FINDINGS: The lungs are hyperinflated.  There are the Fire-Fun
changes especially at the apices.  Heart size is normal.  Crowded
markings are identified at the bases. There is question of
asymmetric infiltrate at the right lung base versus chronic
changes.  Degenerative changes are seen in the spine.
IMPRESSION: 1.  Marked emphysema.
2. Question of superimposed right lower lobe infiltrate.

## 2013-10-05 IMAGING — CR DG CHEST 1V PORT
1 series · 1 of 1 positions shown · non-contrast
Comparison: 09/19/2011; 10/15/2010

CLINICAL DATA: COPD

PORTABLE CHEST - 1 VIEW

[view not recorded]
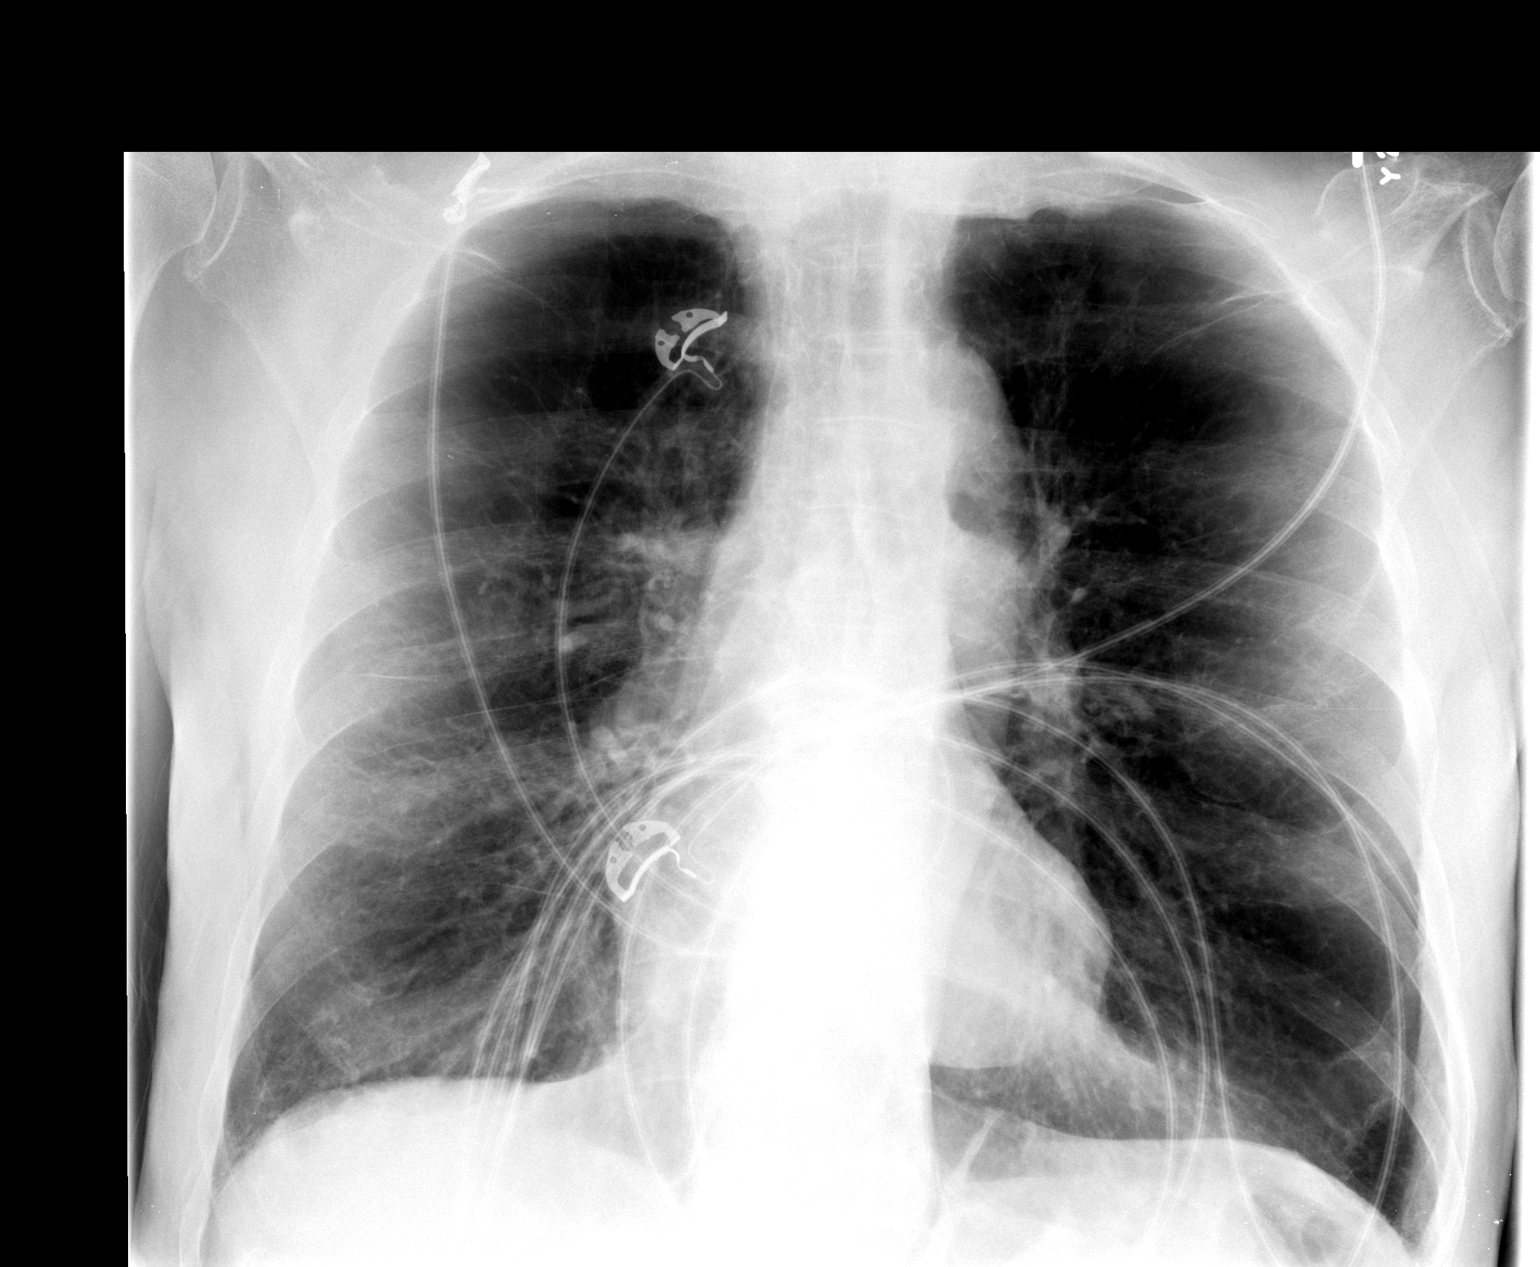

[1 of 1 positions shown; findings below may reference images not displayed]

FINDINGS: Grossly unchanged cardiac silhouette and mediastinal
contours to the lordotic projection.  The lungs remain
hyperinflated. Improved aeration of the right lower lung.  Minimal
left basilar linear heterogeneous opacities favored to represent
subsegmental atelectasis or scar.  No focal airspace opacities.  No
definite pleural effusion or pneumothorax.  Grossly unchanged
bones.
IMPRESSION: 1.  Improved aeration of the right base suggests resolving
atelectasis.

2.  Hyperexpanded lungs suggestive of emphysema.

## 2013-10-06 IMAGING — CR DG CHEST 1V PORT
1 series · 1 of 1 positions shown · non-contrast
Comparison: 09/20/2011

CLINICAL DATA: Infiltrates.

PORTABLE CHEST - 1 VIEW

[view not recorded]
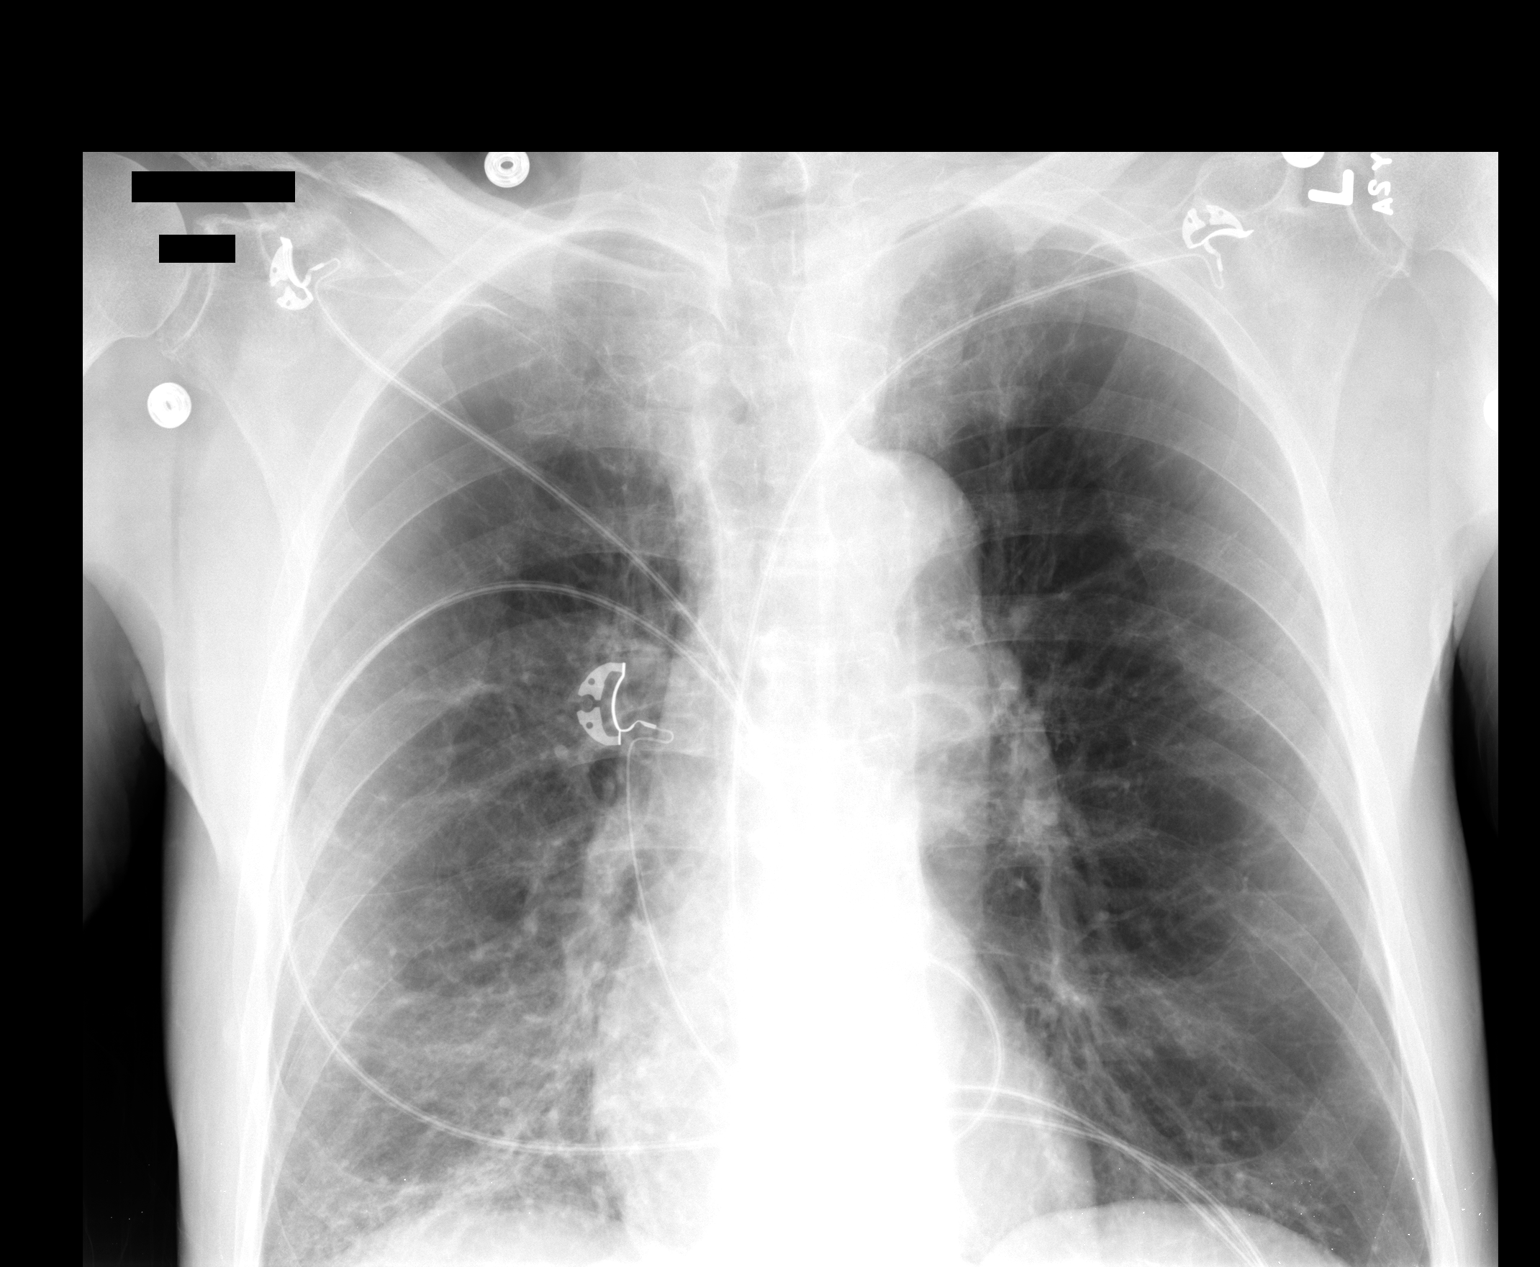

[1 of 1 positions shown; findings below may reference images not displayed]

FINDINGS: There is hyperinflation of the lungs compatible with
COPD.  Heart is normal size.  The right basilar atelectasis or
infiltrate, slightly increased since prior study.  Left lung is
clear.  No effusions or acute bony abnormality.
IMPRESSION: COPD.

Right lower lobe atelectasis or infiltrate, increased since prior
study.

## 2013-10-08 IMAGING — CR DG CHEST 1V PORT
1 series · 1 of 1 positions shown · non-contrast
Comparison: 09/21/2011

CLINICAL DATA: Intubated

PORTABLE CHEST - 1 VIEW

[view not recorded]
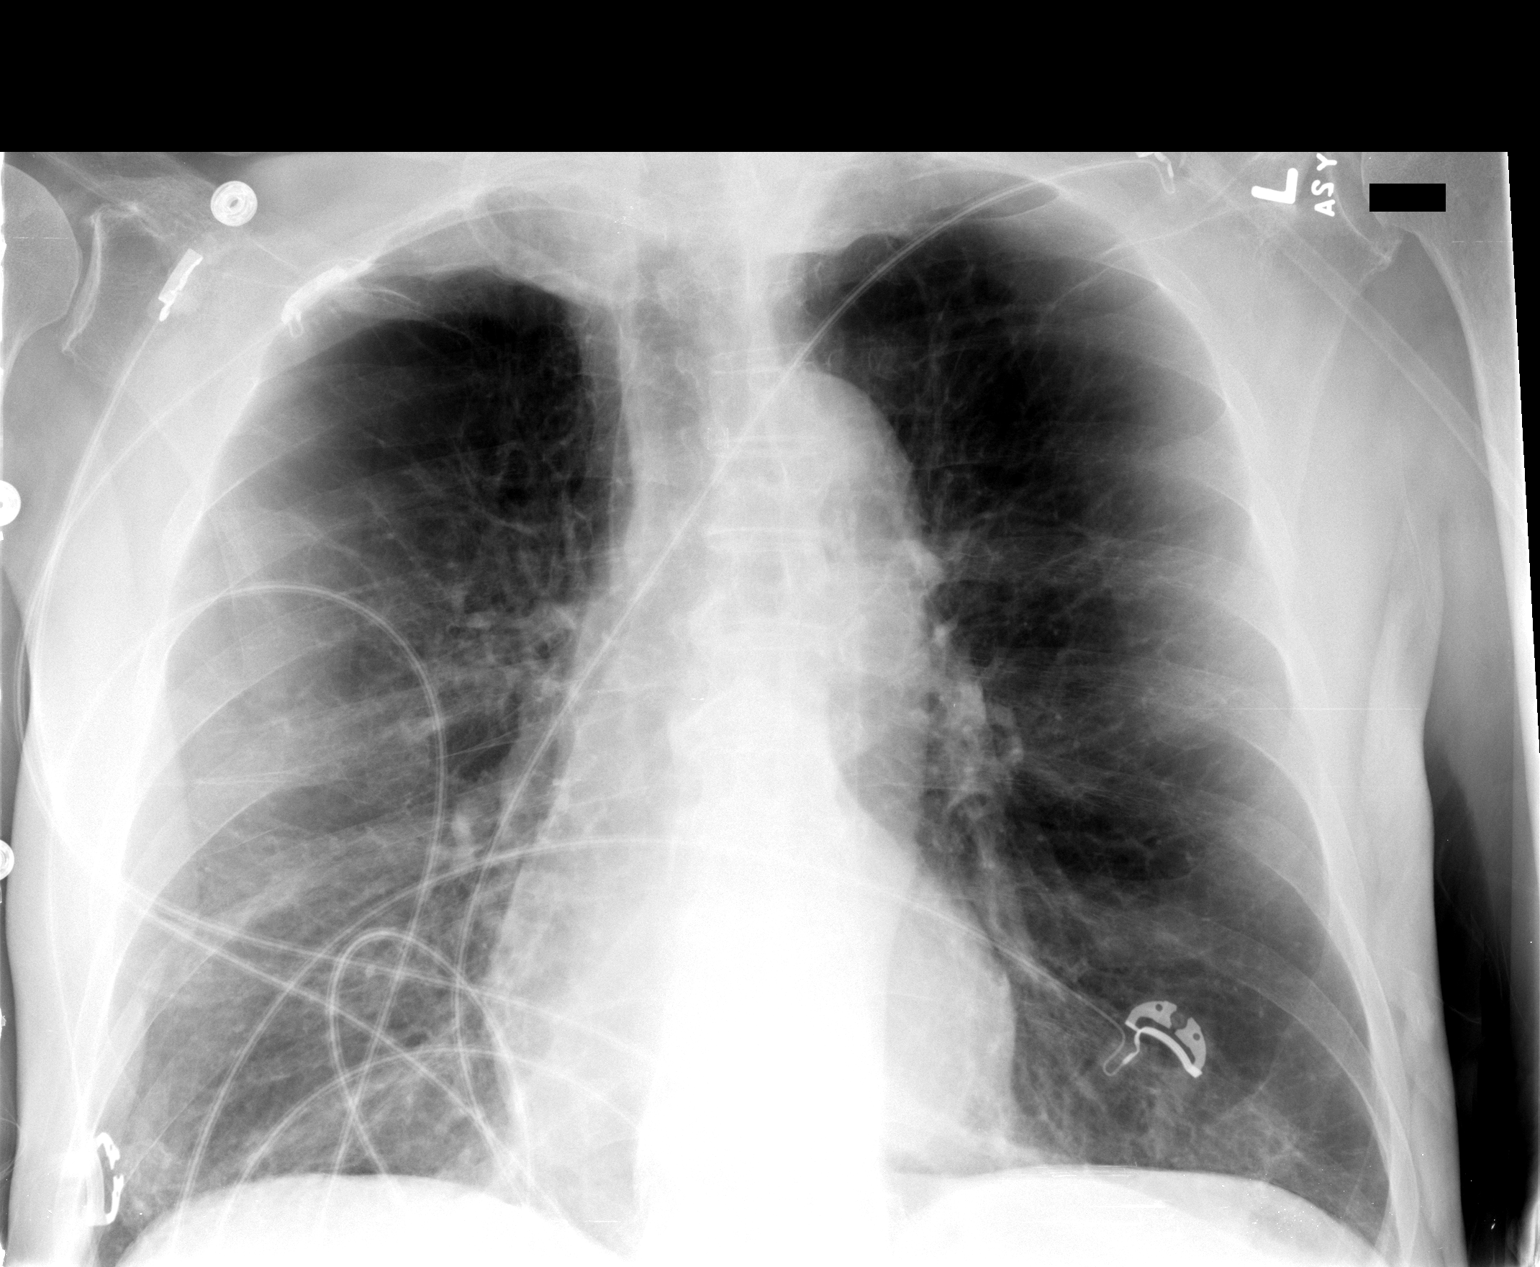

[1 of 1 positions shown; findings below may reference images not displayed]

FINDINGS: An endotracheal tube is not visualized. Hyperinflation
with interstitial prominence, similar to prior. Improved aeration
of the right lower lobe opacity.  Stable cardiomediastinal
contours.  No pneumothorax or pleural effusion.  No acute osseous
abnormality.
IMPRESSION: No endotracheal tube visualized.  Discussed with the patient's
intubated.

Improved aeration of the right lower lobe opacity.

Otherwise no significant interval change.  COPD.

## 2013-10-16 DIAGNOSIS — H4011X0 Primary open-angle glaucoma, stage unspecified: Secondary | ICD-10-CM | POA: Diagnosis not present

## 2013-10-16 DIAGNOSIS — H409 Unspecified glaucoma: Secondary | ICD-10-CM | POA: Diagnosis not present

## 2013-11-09 ENCOUNTER — Other Ambulatory Visit: Payer: Self-pay | Admitting: Vascular Surgery

## 2013-11-09 DIAGNOSIS — I714 Abdominal aortic aneurysm, without rupture, unspecified: Secondary | ICD-10-CM

## 2013-11-09 DIAGNOSIS — Z48812 Encounter for surgical aftercare following surgery on the circulatory system: Secondary | ICD-10-CM

## 2013-11-13 ENCOUNTER — Ambulatory Visit
Admission: RE | Admit: 2013-11-13 | Discharge: 2013-11-13 | Disposition: A | Discharge status: Home / Self Care | Payer: Medicare Other | Source: Ambulatory Visit | Attending: Internal Medicine | Admitting: Internal Medicine

## 2013-11-13 ENCOUNTER — Other Ambulatory Visit: Payer: Self-pay | Admitting: Internal Medicine

## 2013-11-13 DIAGNOSIS — I6350 Cerebral infarction due to unspecified occlusion or stenosis of unspecified cerebral artery: Secondary | ICD-10-CM | POA: Diagnosis not present

## 2013-11-13 DIAGNOSIS — R634 Abnormal weight loss: Secondary | ICD-10-CM

## 2013-11-13 DIAGNOSIS — J449 Chronic obstructive pulmonary disease, unspecified: Secondary | ICD-10-CM | POA: Diagnosis not present

## 2013-11-13 DIAGNOSIS — I714 Abdominal aortic aneurysm, without rupture, unspecified: Secondary | ICD-10-CM | POA: Diagnosis not present

## 2013-11-13 DIAGNOSIS — E559 Vitamin D deficiency, unspecified: Secondary | ICD-10-CM | POA: Diagnosis not present

## 2013-11-13 DIAGNOSIS — E119 Type 2 diabetes mellitus without complications: Secondary | ICD-10-CM | POA: Diagnosis not present

## 2013-11-13 DIAGNOSIS — E782 Mixed hyperlipidemia: Secondary | ICD-10-CM | POA: Diagnosis not present

## 2013-11-13 DIAGNOSIS — J438 Other emphysema: Secondary | ICD-10-CM | POA: Diagnosis not present

## 2013-11-13 DIAGNOSIS — N400 Enlarged prostate without lower urinary tract symptoms: Secondary | ICD-10-CM | POA: Diagnosis not present

## 2013-11-13 DIAGNOSIS — M79609 Pain in unspecified limb: Secondary | ICD-10-CM | POA: Diagnosis not present

## 2013-12-12 DIAGNOSIS — R946 Abnormal results of thyroid function studies: Secondary | ICD-10-CM | POA: Diagnosis not present

## 2013-12-12 DIAGNOSIS — R634 Abnormal weight loss: Secondary | ICD-10-CM | POA: Diagnosis not present

## 2013-12-12 DIAGNOSIS — J209 Acute bronchitis, unspecified: Secondary | ICD-10-CM | POA: Diagnosis not present

## 2013-12-25 ENCOUNTER — Encounter: Payer: Self-pay | Admitting: Family

## 2013-12-26 ENCOUNTER — Ambulatory Visit: Payer: Medicare Other | Admitting: Vascular Surgery

## 2013-12-26 ENCOUNTER — Ambulatory Visit (HOSPITAL_COMMUNITY)
Admission: RE | Admit: 2013-12-26 | Discharge: 2013-12-26 | Disposition: A | Discharge status: Home / Self Care | Payer: Medicare Other | Source: Ambulatory Visit | Attending: Family | Admitting: Family

## 2013-12-26 ENCOUNTER — Encounter: Payer: Self-pay | Admitting: Family

## 2013-12-26 ENCOUNTER — Other Ambulatory Visit: Payer: Medicare Other

## 2013-12-26 ENCOUNTER — Ambulatory Visit (INDEPENDENT_AMBULATORY_CARE_PROVIDER_SITE_OTHER): Payer: Medicare Other | Admitting: Family

## 2013-12-26 VITALS — BP 105/73 | HR 84 | Resp 16 | Ht 71.0 in | Wt 133.0 lb

## 2013-12-26 DIAGNOSIS — I714 Abdominal aortic aneurysm, without rupture, unspecified: Secondary | ICD-10-CM

## 2013-12-26 DIAGNOSIS — Z48812 Encounter for surgical aftercare following surgery on the circulatory system: Secondary | ICD-10-CM

## 2013-12-26 NOTE — Patient Instructions (Addendum)
Abdominal Aortic Aneurysm An aneurysm is a weakened or damaged part of an artery wall that bulges from the normal force of blood pumping through the body. An abdominal aortic aneurysm is an aneurysm that occurs in the lower part of the aorta, the main artery of the body.  The major concern with an abdominal aortic aneurysm is that it can enlarge and burst (rupture) or blood can flow between the layers of the wall of the aorta through a tear (aorticdissection). Both of these conditions can cause bleeding inside the body and can be life threatening unless diagnosed and treated promptly. CAUSES  The exact cause of an abdominal aortic aneurysm is unknown. Some contributing factors are:   A hardening of the arteries caused by the buildup of fat and other substances in the lining of a blood vessel (arteriosclerosis).  Inflammation of the walls of an artery (arteritis).   Connective tissue diseases, such as Marfan syndrome.   Abdominal trauma.   An infection, such as syphilis or staphylococcus, in the wall of the aorta (infectious aortitis) caused by bacteria. RISK FACTORS  Risk factors that contribute to an abdominal aortic aneurysm may include:  Age older than 60 years.   High blood pressure (hypertension).  Male gender.  Ethnicity (white race).  Obesity.  Family history of aneurysm (first degree relatives only).  Tobacco use. PREVENTION  The following healthy lifestyle habits may help decrease your risk of abdominal aortic aneurysm:  Quitting smoking. Smoking can raise your blood pressure and cause arteriosclerosis.  Limiting or avoiding alcohol.  Keeping your blood pressure, blood sugar level, and cholesterol levels within normal limits.  Decreasing your salt intake. In somepeople, too much salt can raise blood pressure and increase your risk of abdominal aortic aneurysm.  Eating a diet low in saturated fats and cholesterol.  Increasing your fiber intake by including  whole grains, vegetables, and fruits in your diet. Eating these foods may help lower blood pressure.  Maintaining a healthy weight.  Staying physically active and exercising regularly. SYMPTOMS  The symptoms of abdominal aortic aneurysm may vary depending on the size and rate of growth of the aneurysm.Most grow slowly and do not have any symptoms. When symptoms do occur, they may include:  Pain (abdomen, side, lower back, or groin). The pain may vary in intensity. A sudden onset of severe pain may indicate that the aneurysm has ruptured.  Feeling full after eating only small amounts of food.  Nausea or vomiting or both.  Feeling a pulsating lump in the abdomen.  Feeling faint or passing out. DIAGNOSIS  Since most unruptured abdominal aortic aneurysms have no symptoms, they are often discovered during diagnostic exams for other conditions. An aneurysm may be found during the following procedures:  Ultrasonography (A one-time screening for abdominal aortic aneurysm by ultrasonography is also recommended for all men aged 65-75 years who have ever smoked).  X-ray exams.  A computed tomography (CT).  Magnetic resonance imaging (MRI).  Angiography or arteriography. TREATMENT  Treatment of an abdominal aortic aneurysm depends on the size of your aneurysm, your age, and risk factors for rupture. Medication to control blood pressure and pain may be used to manage aneurysms smaller than 6 cm. Regular monitoring for enlargement may be recommended by your caregiver if:  The aneurysm is 3 4 cm in size (an annual ultrasonography may be recommended).  The aneurysm is 4 4.5 cm in size (an ultrasonography every 6 months may be recommended).  The aneurysm is larger than 4.5   cm in size (your caregiver may ask that you be examined by a vascular surgeon). If your aneurysm is larger than 6 cm, surgical repair may be recommended. There are two main methods for repair of an aneurysm:   Endovascular  repair (a minimally invasive surgery). This is done most often.  Open repair. This method is used if an endovascular repair is not possible. Document Released: 06/30/2005 Document Revised: 01/15/2013 Document Reviewed: 10/20/2012 ExitCare Patient Information 2014 [employer].   Stroke Prevention Some medical conditions and behaviors are associated with an increased chance of having a stroke. You may prevent a stroke by making healthy choices and managing medical conditions. HOW CAN I REDUCE MY RISK OF HAVING A STROKE?   Stay physically active. Get at least 30 minutes of activity on most or all days.  Do not smoke. It may also be helpful to avoid exposure to secondhand smoke.  Limit alcohol use. Moderate alcohol use is considered to be:  No more than 2 drinks per day for men.  No more than 1 drink per day for nonpregnant women.  Eat healthy foods. This involves  Eating 5 or more servings of fruits and vegetables a day.  Following a diet that addresses high blood pressure (hypertension), high cholesterol, diabetes, or obesity.  Manage your cholesterol levels.  A diet low in saturated fat, trans fat, and cholesterol and high in fiber may control cholesterol levels.  Take any prescribed medicines to control cholesterol as directed by your health care provider.  Manage your diabetes.  A controlled-carbohydrate, controlled-sugar diet is recommended to manage diabetes.  Take any prescribed medicines to control diabetes as directed by your health care provider.  Control your hypertension.  A low-salt (sodium), low-saturated fat, low-trans fat, and low-cholesterol diet is recommended to manage hypertension.  Take any prescribed medicines to control hypertension as directed by your health care provider.  Maintain a healthy weight.  A reduced-calorie, low-sodium, low-saturated fat, low-trans fat, low-cholesterol diet is recommended to manage weight.  Stop drug  abuse.  Avoid taking birth control pills.  Talk to your health care provider about the risks of taking birth control pills if you are over 35 years old, smoke, get migraines, or have ever had a blood clot.  Get evaluated for sleep disorders (sleep apnea).  Talk to your health care provider about getting a sleep evaluation if you snore a lot or have excessive sleepiness.  Take medicines as directed by your health care provider.  For some people, aspirin or blood thinners (anticoagulants) are helpful in reducing the risk of forming abnormal blood clots that can lead to stroke. If you have the irregular heart rhythm of atrial fibrillation, you should be on a blood thinner unless there is a good reason you cannot take them.  Understand all your medicine instructions.  Make sure that other other conditions (such as anemia or atherosclerosis) are addressed. SEEK IMMEDIATE MEDICAL CARE IF:   You have sudden weakness or numbness of the face, arm, or leg, especially on one side of the body.  Your face or eyelid droops to one side.  You have sudden confusion.  You have trouble speaking (aphasia) or understanding.  You have sudden trouble seeing in one or both eyes.  You have sudden trouble walking.  You have dizziness.  You have a loss of balance or coordination.  You have a sudden, severe headache with no known cause.  You have new chest pain or an irregular heartbeat. Any of these symptoms   may represent a serious problem that is an emergency. Do not wait to see if the symptoms will go away. Get medical help at once. Call your local emergency services  (911 in [place] Do not drive yourself to the hospital. Document Released: 10/28/2004 Document Revised: 07/11/2013 Document Reviewed: 03/23/2013 ExitCare Patient Information 2014 [employer].  

## 2013-12-26 NOTE — Progress Notes (Signed)
[facility] [place]  Established EVAR  History of Present Illness  Joseph [name] is a 80 y.o. (12-Oct-1929) male patient of Dr. Edilia Boickson who is s/p repair of a 5.6 cm abdominal aortic aneurysm with percutaneous EVAR on 10/15/2010. He comes in for a routine follow up visit.     The patient denies back or abdominal pain. He had a stroke in July 2014, [facility]. He has residual right arm hemiparesis, right leg not affected, and mild right facial droop, he denies vision changes, slurred speech has improved. He had an MI 16 years ago and quit smoking then. He denies claudication symptoms with walking, denies non healing wounds. He is taking Plavix since his stroke, not taking ASA. He cannot take statins, had "swelling" reaction.  Pt Diabetic: Yes, states in good control Pt smoker: former smoker, quit 16 years ago   Past Medical History  Diagnosis Date  . Diabetes mellitus, type 2   . Hyperlipidemia   . Myocardial infarction   . COPD (chronic obstructive pulmonary disease)   . Emphysema of lung   . Abdominal aneurysm without mention of rupture   . Thyroid disease     Hyperthyroidism, Goiter  . Hypotension    Past Surgical History  Procedure Laterality Date  . Hernia repair      LEFT INGUINAL  . Abdominal aorta stent      ENDOSTENT REPAIR 10/15/2010  . Abdominal aortic aneurysm repair  05/26/11    PEVAR  . Prostate surgery  Sept. 2012   Social History History  Substance Use Topics  . Smoking status: Former Smoker -- 1.50 packs/day    Types: Cigarettes    Quit date: 10/04/2004  . Smokeless tobacco: Former NeurosurgeonUser  . Alcohol Use: No   Family History Family History  Problem Relation Age of Onset  . Alzheimer's disease Father    Current Outpatient Prescriptions on File Prior to Visit  Medication Sig Dispense Refill  . brinzolamide (AZOPT) 1 % ophthalmic suspension Place 1 drop into the left eye every morning.       . budesonide (PULMICORT  FLEXHALER) 180 MCG/ACT inhaler Inhale 1 puff into the lungs 2 (two) times daily.        . cholecalciferol (VITAMIN D) 1000 UNITS tablet Take 1,000 Units by mouth daily.      . clopidogrel (PLAVIX) 75 MG tablet Take 1 tablet (75 mg total) by mouth daily with breakfast.  30 tablet  11  . metFORMIN (GLUCOPHAGE) 500 MG tablet Take 500 mg by mouth 2 (two) times daily with a meal.        . SPIRIVA HANDIHALER 18 MCG inhalation capsule daily.      . travoprost, benzalkonium, (TRAVATAN) 0.004 % ophthalmic solution Place 1 drop into both eyes at bedtime.       Marland Kitchen. ezetimibe (ZETIA) 10 MG tablet Take 10 mg by mouth daily.       . insulin glargine (LANTUS) 100 UNIT/ML injection Inject 10 Units into the skin at bedtime.  10 mL  0   No current facility-administered medications on file prior to visit.   Allergies  Allergen Reactions  . Other Other (See Comments)    Says that antibiotics made his mouth sore   . Statins Other (See Comments)    Body aches     ROS: See HPI for pertinent positives and negatives.  Physical Examination  Filed Vitals:   12/26/13 0912  BP: 105/73  Pulse: 84  Resp:  16   Filed Weights   12/26/13 0912  Weight: 133 lb (60.328 kg)   Body mass index is 18.56 kg/(m^2).  General: A&O x 3, WD, thin.  Pulmonary: Sym exp, diminished air movt, CTAB, no rales, rhonchi, or wheezing.  Cardiac: RRR, Nl S1, S2, no detected Murmurs. ascular: Vessel Right Left  Radial Palpable Palpable  Carotid  without bruit  without bruit  Aorta Not palpable N/A  Femoral Palpable Palpable  Popliteal Not palpable Not palpable  PT Palpable Palpable  DP Palpable Faintly Palpable   Gastrointestinal: soft, NTND, -G/R, - HSM, - masses, - CVAT B.  Musculoskeletal: M/S 4/5 throughout, Extremities without ischemic changes.  Neurologic: Pain and light touch intact in extremities,   Motor exam as listed above.   Non-Invasive Vascular Imaging  EVAR Duplex (Date: 12/26/2013)  AAA sac size:  3.69 cm x 3.96 cm  no endoleak detected  Medical Decision Making  [name] is a 80 y.o. male who presents s/p repair of a 5.6 cm abdominal aortic aneurysm with percutaneous EVAR on 10/15/2010.  Pt is asymptomatic with decreased sac size.  I discussed with the patient the importance of surveillance of the endograft.  The next endograft duplex will be scheduled for 12 months.  The patient will follow up with [place] in 12 months with these studies.  I emphasized the importance of maximal medical management including strict control of blood pressure, blood glucose, and lipid levels, antiplatelet agents, obtaining regular exercise, and cessation of smoking.   The patient was given information about AAA including signs, symptoms, treatment, and how to minimize the risk of enlargement and rupture of aneurysms.    Thank you for allowing [place] to participate in this patient's care.  Charisse March, RN, MSN, FNP-C [facility] [place] Office: [phone]  Clinic Physician: Edilia Bo  12/26/2013, 9:10 AM

## 2013-12-27 DIAGNOSIS — E0520 Thyrotoxicosis with toxic multinodular goiter without thyrotoxic crisis or storm: Secondary | ICD-10-CM | POA: Diagnosis not present

## 2014-01-15 DIAGNOSIS — H4011X0 Primary open-angle glaucoma, stage unspecified: Secondary | ICD-10-CM | POA: Diagnosis not present

## 2014-01-15 DIAGNOSIS — H409 Unspecified glaucoma: Secondary | ICD-10-CM | POA: Diagnosis not present

## 2014-01-23 DIAGNOSIS — J449 Chronic obstructive pulmonary disease, unspecified: Secondary | ICD-10-CM | POA: Diagnosis not present

## 2014-01-23 DIAGNOSIS — R946 Abnormal results of thyroid function studies: Secondary | ICD-10-CM | POA: Diagnosis not present

## 2014-01-23 DIAGNOSIS — R634 Abnormal weight loss: Secondary | ICD-10-CM | POA: Diagnosis not present

## 2014-01-23 DIAGNOSIS — E782 Mixed hyperlipidemia: Secondary | ICD-10-CM | POA: Diagnosis not present

## 2014-01-23 DIAGNOSIS — I10 Essential (primary) hypertension: Secondary | ICD-10-CM | POA: Diagnosis not present

## 2014-01-23 DIAGNOSIS — J209 Acute bronchitis, unspecified: Secondary | ICD-10-CM | POA: Diagnosis not present

## 2014-01-23 DIAGNOSIS — E119 Type 2 diabetes mellitus without complications: Secondary | ICD-10-CM | POA: Diagnosis not present

## 2014-01-23 DIAGNOSIS — I6350 Cerebral infarction due to unspecified occlusion or stenosis of unspecified cerebral artery: Secondary | ICD-10-CM | POA: Diagnosis not present

## 2014-02-15 DIAGNOSIS — R1032 Left lower quadrant pain: Secondary | ICD-10-CM | POA: Diagnosis not present

## 2014-03-01 DIAGNOSIS — L299 Pruritus, unspecified: Secondary | ICD-10-CM | POA: Diagnosis not present

## 2014-03-01 DIAGNOSIS — R21 Rash and other nonspecific skin eruption: Secondary | ICD-10-CM | POA: Diagnosis not present

## 2014-04-16 DIAGNOSIS — H4011X0 Primary open-angle glaucoma, stage unspecified: Secondary | ICD-10-CM | POA: Diagnosis not present

## 2014-04-16 DIAGNOSIS — H409 Unspecified glaucoma: Secondary | ICD-10-CM | POA: Diagnosis not present

## 2014-05-16 DIAGNOSIS — J449 Chronic obstructive pulmonary disease, unspecified: Secondary | ICD-10-CM | POA: Diagnosis not present

## 2014-05-16 DIAGNOSIS — I10 Essential (primary) hypertension: Secondary | ICD-10-CM | POA: Diagnosis not present

## 2014-05-16 DIAGNOSIS — Z1331 Encounter for screening for depression: Secondary | ICD-10-CM | POA: Diagnosis not present

## 2014-05-16 DIAGNOSIS — E119 Type 2 diabetes mellitus without complications: Secondary | ICD-10-CM | POA: Diagnosis not present

## 2014-05-16 DIAGNOSIS — R634 Abnormal weight loss: Secondary | ICD-10-CM | POA: Diagnosis not present

## 2014-05-16 DIAGNOSIS — E559 Vitamin D deficiency, unspecified: Secondary | ICD-10-CM | POA: Diagnosis not present

## 2014-05-16 DIAGNOSIS — Z23 Encounter for immunization: Secondary | ICD-10-CM | POA: Diagnosis not present

## 2014-05-16 DIAGNOSIS — Z0000 Encounter for general adult medical examination without abnormal findings: Secondary | ICD-10-CM | POA: Diagnosis not present

## 2014-05-16 DIAGNOSIS — E782 Mixed hyperlipidemia: Secondary | ICD-10-CM | POA: Diagnosis not present

## 2014-06-16 ENCOUNTER — Encounter: Payer: Self-pay | Admitting: *Deleted

## 2014-07-09 DIAGNOSIS — Z23 Encounter for immunization: Secondary | ICD-10-CM | POA: Diagnosis not present

## 2014-07-16 DIAGNOSIS — H4011X2 Primary open-angle glaucoma, moderate stage: Secondary | ICD-10-CM | POA: Diagnosis not present

## 2014-11-04 DIAGNOSIS — J449 Chronic obstructive pulmonary disease, unspecified: Secondary | ICD-10-CM | POA: Diagnosis not present

## 2014-11-04 DIAGNOSIS — E0520 Thyrotoxicosis with toxic multinodular goiter without thyrotoxic crisis or storm: Secondary | ICD-10-CM | POA: Diagnosis not present

## 2014-11-04 DIAGNOSIS — Z79899 Other long term (current) drug therapy: Secondary | ICD-10-CM | POA: Diagnosis not present

## 2014-11-04 DIAGNOSIS — I714 Abdominal aortic aneurysm, without rupture: Secondary | ICD-10-CM | POA: Diagnosis not present

## 2014-11-04 DIAGNOSIS — I10 Essential (primary) hypertension: Secondary | ICD-10-CM | POA: Diagnosis not present

## 2014-11-04 DIAGNOSIS — I2510 Atherosclerotic heart disease of native coronary artery without angina pectoris: Secondary | ICD-10-CM | POA: Diagnosis not present

## 2014-11-04 DIAGNOSIS — N400 Enlarged prostate without lower urinary tract symptoms: Secondary | ICD-10-CM | POA: Diagnosis not present

## 2014-11-12 DIAGNOSIS — H4011X2 Primary open-angle glaucoma, moderate stage: Secondary | ICD-10-CM | POA: Diagnosis not present

## 2014-12-16 DIAGNOSIS — Z111 Encounter for screening for respiratory tuberculosis: Secondary | ICD-10-CM | POA: Diagnosis not present

## 2014-12-25 DIAGNOSIS — H409 Unspecified glaucoma: Secondary | ICD-10-CM | POA: Diagnosis not present

## 2014-12-25 DIAGNOSIS — N400 Enlarged prostate without lower urinary tract symptoms: Secondary | ICD-10-CM | POA: Diagnosis not present

## 2014-12-25 DIAGNOSIS — I2510 Atherosclerotic heart disease of native coronary artery without angina pectoris: Secondary | ICD-10-CM | POA: Diagnosis not present

## 2014-12-25 DIAGNOSIS — E119 Type 2 diabetes mellitus without complications: Secondary | ICD-10-CM | POA: Diagnosis not present

## 2014-12-25 DIAGNOSIS — J449 Chronic obstructive pulmonary disease, unspecified: Secondary | ICD-10-CM | POA: Diagnosis not present

## 2014-12-25 DIAGNOSIS — E785 Hyperlipidemia, unspecified: Secondary | ICD-10-CM | POA: Diagnosis not present

## 2014-12-25 DIAGNOSIS — I10 Essential (primary) hypertension: Secondary | ICD-10-CM | POA: Diagnosis not present

## 2014-12-25 DIAGNOSIS — Z66 Do not resuscitate: Secondary | ICD-10-CM | POA: Diagnosis not present

## 2014-12-26 DIAGNOSIS — R1011 Right upper quadrant pain: Secondary | ICD-10-CM | POA: Diagnosis not present

## 2014-12-26 DIAGNOSIS — Z66 Do not resuscitate: Secondary | ICD-10-CM | POA: Diagnosis not present

## 2014-12-26 DIAGNOSIS — E119 Type 2 diabetes mellitus without complications: Secondary | ICD-10-CM | POA: Diagnosis not present

## 2014-12-26 DIAGNOSIS — I2510 Atherosclerotic heart disease of native coronary artery without angina pectoris: Secondary | ICD-10-CM | POA: Diagnosis not present

## 2014-12-26 DIAGNOSIS — E785 Hyperlipidemia, unspecified: Secondary | ICD-10-CM | POA: Diagnosis not present

## 2014-12-26 DIAGNOSIS — I10 Essential (primary) hypertension: Secondary | ICD-10-CM | POA: Diagnosis not present

## 2014-12-26 DIAGNOSIS — J449 Chronic obstructive pulmonary disease, unspecified: Secondary | ICD-10-CM | POA: Diagnosis not present

## 2014-12-27 ENCOUNTER — Observation Stay: Payer: Self-pay | Admitting: Internal Medicine

## 2014-12-27 DIAGNOSIS — Z66 Do not resuscitate: Secondary | ICD-10-CM | POA: Diagnosis not present

## 2014-12-27 DIAGNOSIS — N1330 Unspecified hydronephrosis: Secondary | ICD-10-CM | POA: Diagnosis not present

## 2014-12-27 DIAGNOSIS — Z955 Presence of coronary angioplasty implant and graft: Secondary | ICD-10-CM | POA: Diagnosis not present

## 2014-12-27 DIAGNOSIS — Z9981 Dependence on supplemental oxygen: Secondary | ICD-10-CM | POA: Diagnosis not present

## 2014-12-27 DIAGNOSIS — N210 Calculus in bladder: Secondary | ICD-10-CM | POA: Diagnosis not present

## 2014-12-27 DIAGNOSIS — N281 Cyst of kidney, acquired: Secondary | ICD-10-CM | POA: Diagnosis not present

## 2014-12-27 DIAGNOSIS — Z8673 Personal history of transient ischemic attack (TIA), and cerebral infarction without residual deficits: Secondary | ICD-10-CM | POA: Diagnosis not present

## 2014-12-27 DIAGNOSIS — Z87442 Personal history of urinary calculi: Secondary | ICD-10-CM | POA: Diagnosis not present

## 2014-12-27 DIAGNOSIS — R109 Unspecified abdominal pain: Secondary | ICD-10-CM | POA: Diagnosis not present

## 2014-12-27 DIAGNOSIS — N201 Calculus of ureter: Secondary | ICD-10-CM | POA: Diagnosis not present

## 2014-12-27 DIAGNOSIS — N323 Diverticulum of bladder: Secondary | ICD-10-CM | POA: Diagnosis not present

## 2014-12-27 DIAGNOSIS — K5730 Diverticulosis of large intestine without perforation or abscess without bleeding: Secondary | ICD-10-CM | POA: Diagnosis not present

## 2014-12-27 DIAGNOSIS — I10 Essential (primary) hypertension: Secondary | ICD-10-CM | POA: Diagnosis not present

## 2014-12-27 DIAGNOSIS — Z7902 Long term (current) use of antithrombotics/antiplatelets: Secondary | ICD-10-CM | POA: Diagnosis not present

## 2014-12-27 DIAGNOSIS — Z720 Tobacco use: Secondary | ICD-10-CM | POA: Diagnosis not present

## 2014-12-27 DIAGNOSIS — E119 Type 2 diabetes mellitus without complications: Secondary | ICD-10-CM | POA: Diagnosis not present

## 2014-12-27 DIAGNOSIS — E785 Hyperlipidemia, unspecified: Secondary | ICD-10-CM | POA: Diagnosis not present

## 2014-12-27 DIAGNOSIS — N132 Hydronephrosis with renal and ureteral calculous obstruction: Secondary | ICD-10-CM | POA: Diagnosis not present

## 2014-12-27 DIAGNOSIS — J449 Chronic obstructive pulmonary disease, unspecified: Secondary | ICD-10-CM | POA: Diagnosis not present

## 2014-12-27 DIAGNOSIS — I2510 Atherosclerotic heart disease of native coronary artery without angina pectoris: Secondary | ICD-10-CM | POA: Diagnosis not present

## 2014-12-27 DIAGNOSIS — Z79899 Other long term (current) drug therapy: Secondary | ICD-10-CM | POA: Diagnosis not present

## 2014-12-27 DIAGNOSIS — Z7952 Long term (current) use of systemic steroids: Secondary | ICD-10-CM | POA: Diagnosis not present

## 2014-12-27 DIAGNOSIS — K5790 Diverticulosis of intestine, part unspecified, without perforation or abscess without bleeding: Secondary | ICD-10-CM | POA: Diagnosis not present

## 2014-12-27 LAB — COMPREHENSIVE METABOLIC PANEL
ALBUMIN: 4.3 g/dL
ANION GAP: 11 (ref 7–16)
Alkaline Phosphatase: 76 U/L
BUN: 23 mg/dL — AB
Bilirubin,Total: 0.4 mg/dL
CALCIUM: 9.8 mg/dL
CHLORIDE: 102 mmol/L
CO2: 28 mmol/L
Creatinine: 1.04 mg/dL
EGFR (African American): >60
EGFR (Non-African Amer.): >60
Glucose: 210 mg/dL — ABNORMAL HIGH
Potassium: 4.5 mmol/L
SGOT(AST): 18 U/L
SGPT (ALT): 11 U/L — ABNORMAL LOW
Sodium: 141 mmol/L
Total Protein: 7.3 g/dL

## 2014-12-27 LAB — CBC
HCT: 46.4 % (ref 40.0–52.0)
HGB: 15 g/dL (ref 13.0–18.0)
MCH: 31 pg (ref 26.0–34.0)
MCHC: 32.2 g/dL (ref 32.0–36.0)
MCV: 96 fL (ref 80–100)
Platelet: 284 10*3/uL (ref 150–440)
RBC: 4.83 10*6/uL (ref 4.40–5.90)
RDW: 14.4 % (ref 11.5–14.5)
WBC: 15.5 10*3/uL — ABNORMAL HIGH (ref 3.8–10.6)

## 2014-12-27 LAB — URINALYSIS, COMPLETE
Bacteria: NONE SEEN
Bilirubin,UR: NEGATIVE
Glucose,UR: 150 mg/dL (ref 0–75)
Ketone: NEGATIVE
NITRITE: NEGATIVE
PH: 6 (ref 4.5–8.0)
Protein: 100
RBC,UR: 1816 /HPF (ref 0–5)
Specific Gravity: 1.014 (ref 1.003–1.030)
Squamous Epithelial: NONE SEEN

## 2014-12-27 LAB — TROPONIN I: Troponin-I: <0.03 ng/mL

## 2014-12-27 LAB — LIPASE, BLOOD: LIPASE: 17 U/L — AB

## 2014-12-28 DIAGNOSIS — J449 Chronic obstructive pulmonary disease, unspecified: Secondary | ICD-10-CM | POA: Diagnosis not present

## 2014-12-28 DIAGNOSIS — I10 Essential (primary) hypertension: Secondary | ICD-10-CM | POA: Diagnosis not present

## 2014-12-28 DIAGNOSIS — I2510 Atherosclerotic heart disease of native coronary artery without angina pectoris: Secondary | ICD-10-CM | POA: Diagnosis not present

## 2014-12-28 DIAGNOSIS — E785 Hyperlipidemia, unspecified: Secondary | ICD-10-CM | POA: Diagnosis not present

## 2014-12-28 DIAGNOSIS — E119 Type 2 diabetes mellitus without complications: Secondary | ICD-10-CM | POA: Diagnosis not present

## 2014-12-28 DIAGNOSIS — Z66 Do not resuscitate: Secondary | ICD-10-CM | POA: Diagnosis not present

## 2014-12-30 DIAGNOSIS — Z66 Do not resuscitate: Secondary | ICD-10-CM | POA: Diagnosis not present

## 2014-12-30 DIAGNOSIS — I2510 Atherosclerotic heart disease of native coronary artery without angina pectoris: Secondary | ICD-10-CM | POA: Diagnosis not present

## 2014-12-30 DIAGNOSIS — E785 Hyperlipidemia, unspecified: Secondary | ICD-10-CM | POA: Diagnosis not present

## 2014-12-30 DIAGNOSIS — J449 Chronic obstructive pulmonary disease, unspecified: Secondary | ICD-10-CM | POA: Diagnosis not present

## 2014-12-30 DIAGNOSIS — I10 Essential (primary) hypertension: Secondary | ICD-10-CM | POA: Diagnosis not present

## 2014-12-30 DIAGNOSIS — E119 Type 2 diabetes mellitus without complications: Secondary | ICD-10-CM | POA: Diagnosis not present

## 2015-01-01 ENCOUNTER — Ambulatory Visit: Payer: Medicare Other | Admitting: Family

## 2015-01-01 ENCOUNTER — Other Ambulatory Visit (HOSPITAL_COMMUNITY): Payer: Medicare Other

## 2015-01-01 LAB — CULTURE, BLOOD (SINGLE)

## 2015-01-02 DIAGNOSIS — Z66 Do not resuscitate: Secondary | ICD-10-CM | POA: Diagnosis not present

## 2015-01-02 DIAGNOSIS — E785 Hyperlipidemia, unspecified: Secondary | ICD-10-CM | POA: Diagnosis not present

## 2015-01-02 DIAGNOSIS — E119 Type 2 diabetes mellitus without complications: Secondary | ICD-10-CM | POA: Diagnosis not present

## 2015-01-02 DIAGNOSIS — J449 Chronic obstructive pulmonary disease, unspecified: Secondary | ICD-10-CM | POA: Diagnosis not present

## 2015-01-02 DIAGNOSIS — I2510 Atherosclerotic heart disease of native coronary artery without angina pectoris: Secondary | ICD-10-CM | POA: Diagnosis not present

## 2015-01-02 DIAGNOSIS — I10 Essential (primary) hypertension: Secondary | ICD-10-CM | POA: Diagnosis not present

## 2015-01-03 DIAGNOSIS — E785 Hyperlipidemia, unspecified: Secondary | ICD-10-CM | POA: Diagnosis not present

## 2015-01-03 DIAGNOSIS — N400 Enlarged prostate without lower urinary tract symptoms: Secondary | ICD-10-CM | POA: Diagnosis not present

## 2015-01-03 DIAGNOSIS — Z66 Do not resuscitate: Secondary | ICD-10-CM | POA: Diagnosis not present

## 2015-01-03 DIAGNOSIS — I10 Essential (primary) hypertension: Secondary | ICD-10-CM | POA: Diagnosis not present

## 2015-01-03 DIAGNOSIS — J449 Chronic obstructive pulmonary disease, unspecified: Secondary | ICD-10-CM | POA: Diagnosis not present

## 2015-01-03 DIAGNOSIS — H409 Unspecified glaucoma: Secondary | ICD-10-CM | POA: Diagnosis not present

## 2015-01-03 DIAGNOSIS — E119 Type 2 diabetes mellitus without complications: Secondary | ICD-10-CM | POA: Diagnosis not present

## 2015-01-03 DIAGNOSIS — I2510 Atherosclerotic heart disease of native coronary artery without angina pectoris: Secondary | ICD-10-CM | POA: Diagnosis not present

## 2015-01-10 DIAGNOSIS — N201 Calculus of ureter: Secondary | ICD-10-CM | POA: Diagnosis not present

## 2015-01-10 DIAGNOSIS — J449 Chronic obstructive pulmonary disease, unspecified: Secondary | ICD-10-CM | POA: Diagnosis not present

## 2015-01-10 DIAGNOSIS — N1330 Unspecified hydronephrosis: Secondary | ICD-10-CM | POA: Diagnosis not present

## 2015-01-10 DIAGNOSIS — Z8679 Personal history of other diseases of the circulatory system: Secondary | ICD-10-CM | POA: Diagnosis not present

## 2015-01-13 ENCOUNTER — Ambulatory Visit
Admit: 2015-01-13 | Disposition: A | Discharge status: Home / Self Care | Payer: Self-pay | Attending: Urology | Admitting: Urology

## 2015-01-13 DIAGNOSIS — N200 Calculus of kidney: Secondary | ICD-10-CM | POA: Diagnosis not present

## 2015-01-15 LAB — URINE CULTURE

## 2015-01-20 ENCOUNTER — Ambulatory Visit
Admit: 2015-01-20 | Disposition: A | Discharge status: Home / Self Care | Payer: Self-pay | Attending: Urology | Admitting: Urology

## 2015-01-20 DIAGNOSIS — E119 Type 2 diabetes mellitus without complications: Secondary | ICD-10-CM | POA: Diagnosis not present

## 2015-01-20 DIAGNOSIS — F17200 Nicotine dependence, unspecified, uncomplicated: Secondary | ICD-10-CM | POA: Diagnosis not present

## 2015-01-20 DIAGNOSIS — R200 Anesthesia of skin: Secondary | ICD-10-CM | POA: Diagnosis not present

## 2015-01-20 DIAGNOSIS — Z8673 Personal history of transient ischemic attack (TIA), and cerebral infarction without residual deficits: Secondary | ICD-10-CM | POA: Diagnosis not present

## 2015-01-20 DIAGNOSIS — Z87442 Personal history of urinary calculi: Secondary | ICD-10-CM | POA: Diagnosis not present

## 2015-01-20 DIAGNOSIS — N201 Calculus of ureter: Secondary | ICD-10-CM | POA: Diagnosis not present

## 2015-01-20 DIAGNOSIS — I2510 Atherosclerotic heart disease of native coronary artery without angina pectoris: Secondary | ICD-10-CM | POA: Diagnosis not present

## 2015-01-20 DIAGNOSIS — I252 Old myocardial infarction: Secondary | ICD-10-CM | POA: Diagnosis not present

## 2015-01-20 DIAGNOSIS — J439 Emphysema, unspecified: Secondary | ICD-10-CM | POA: Diagnosis not present

## 2015-01-20 DIAGNOSIS — M1990 Unspecified osteoarthritis, unspecified site: Secondary | ICD-10-CM | POA: Diagnosis not present

## 2015-01-20 DIAGNOSIS — Z7902 Long term (current) use of antithrombotics/antiplatelets: Secondary | ICD-10-CM | POA: Diagnosis not present

## 2015-01-20 DIAGNOSIS — Z79899 Other long term (current) drug therapy: Secondary | ICD-10-CM | POA: Diagnosis not present

## 2015-01-20 DIAGNOSIS — Z888 Allergy status to other drugs, medicaments and biological substances status: Secondary | ICD-10-CM | POA: Diagnosis not present

## 2015-01-21 ENCOUNTER — Other Ambulatory Visit: Payer: Self-pay | Admitting: Urology

## 2015-01-21 DIAGNOSIS — N201 Calculus of ureter: Secondary | ICD-10-CM

## 2015-02-02 DIAGNOSIS — H409 Unspecified glaucoma: Secondary | ICD-10-CM | POA: Diagnosis not present

## 2015-02-02 DIAGNOSIS — Z66 Do not resuscitate: Secondary | ICD-10-CM | POA: Diagnosis not present

## 2015-02-02 DIAGNOSIS — I2510 Atherosclerotic heart disease of native coronary artery without angina pectoris: Secondary | ICD-10-CM | POA: Diagnosis not present

## 2015-02-02 DIAGNOSIS — E119 Type 2 diabetes mellitus without complications: Secondary | ICD-10-CM | POA: Diagnosis not present

## 2015-02-02 DIAGNOSIS — E785 Hyperlipidemia, unspecified: Secondary | ICD-10-CM | POA: Diagnosis not present

## 2015-02-02 DIAGNOSIS — J449 Chronic obstructive pulmonary disease, unspecified: Secondary | ICD-10-CM | POA: Diagnosis not present

## 2015-02-02 DIAGNOSIS — I10 Essential (primary) hypertension: Secondary | ICD-10-CM | POA: Diagnosis not present

## 2015-02-02 DIAGNOSIS — N400 Enlarged prostate without lower urinary tract symptoms: Secondary | ICD-10-CM | POA: Diagnosis not present

## 2015-02-02 NOTE — Discharge Summary (Signed)
PATIENT NAME:  Joseph [name], Joseph [name] MR#:  [id number] DATE OF BIRTH:  1930/04/13  DATE OF ADMISSION:  12/27/2014 DATE OF DISCHARGE:  12/27/2014  PRESENTING COMPLAINT: Abdominal pain.   DISCHARGE DIAGNOSIS: Right right-sided hydronephrosis due to the right ureteral stones, status post right ureteral stent placement.   PROCEDURES: Right ureteral stent placement with right/left ureteral stone removal on March 25.   CODE STATUS: No code, DNR.   MEDICATIONS: 1.  Plavix 75 mg daily.  2.  Vitamin D 3000 international units p.o. daily.  3.  Metformin 500 mg b.i.d.  4.  Spiriva 18 mcg 1 capsule daily.  5.  DuoNebs as needed.  6.  Travatan 0.004% eyedrops 1 drop to each affected eye once a day.  7.  Acetaminophen/hydrocodone 5/325 t.i.d. p.r.n.  8.  Tylenol 325 two tablets every 4 hours as needed.   FOLLOWUP: With [facility] in 10 days.   CONSULTATIONS: Urology consultation, Dr. Mena GoesEskridge.    LABORATORY DATA: Blood cultures negative in 18 to 24 hours.   CT of the abdomen showed moderate to severe right-sided hydronephrosis with diffuse right-sided perinephric stranding and fluid with obstructing 7 to 6 mm stone in the distal right ureter. Two large bladder diverticula noted. Multiple stones  dependently in the bladder and bladder diverticula. Marked emphysematous changes at the lung bases. Aortoiliac stent graft remains fully patent. Thrombosed aneurysm sac measuring 3.8 cm in the transverse dimension seen. UA positive for hematuria. White count was 15,000.   HISTORY OF PRESENT ILLNESS: Joseph [name] is a pleasant 81 year old Caucasian gentleman with history of coronary artery disease, status post PCI with stent placement, type 2 diabetes, came in with right-sided flank pain. He was admitted with:  1.  Right-sided hydronephrosis, which was secondary to obstructive ureteral stone. Underwent right ureteral stone placement with removal of left ureteral orifice stone. He was on IV  antibiotics with IV Rocephin, changed to p.o. Keflex. Will follow with urology as outpatient.  2.  Type 2 diabetes. Resumed home medications.  3.  Chronic obstructive pulmonary disease. Not in acute exacerbation. Continued his oxygen and p.r.n. nebulizers.  4.  Deep vein thrombosis prophylaxis with SCDs.   Hospital stay otherwise remained stable. The patient remained a DNR. He will be discharged back to [facility].   TIME SPENT: 40 minutes.    ____________________________ Wylie HailSona A. Allena KatzPatel, MD sap:at D: 12/28/2014 06:39:36 ET T: 12/28/2014 13:04:58 ET JOB#: [id number]  cc: Marcia Hartwell A. Allena KatzPatel, MD, <Dictator> Willow OraSONA A Brynn Mulgrew MD ELECTRONICALLY SIGNED 01/06/2015 12:24

## 2015-02-02 NOTE — Op Note (Signed)
PATIENT NAME:  Joseph [name], Joseph [name] MR#:  [id number] DATE OF BIRTH:  September 06, 1930  DATE OF PROCEDURE:  12/27/2014  PREOPERATIVE DIAGNOSES:   1.  Right ureteral stent. 2.  Right hydronephrosis.  POSTOPERATIVE DIAGNOSES:   1.  Right ureteral stone. 2.  Right hydronephrosis. 3.  Left ureteral stone.   PROCEDURE:  Exam under anesthesia, cystoscopy, bilateral retrograde pyelogram, left ureteral stone extraction, right ureteral stent placement.  SURGEON:  Jerilee FieldMatthew Nyisha Clippard, MD.  ANESTHESIA:  General.  INDICATION FOR PROCEDURE:  The patient is an 81 year old male with significant right flank pain.  CT revealed a 7 mm stone in the right mid-ureter with proximal hydroureteronephrosis. He was brought to the OR today for ureteral stent and preparation for shockwave lithotripsy or ureteroscopy at a later date.    FINDINGS:  On exam under anesthesia, the penis was normal without mass or lesion.  The testicles were descended bilaterally and palpably normal.  On digital rectal exam, the prostate was smooth without hard area or nodules.  On cystoscopy, the urethra was normal.  The prostatic urethra showed a good TURP defect.  The trigone and ureteral orifices were in their normal orthotopic position.  The bladder was large capacity.  The mucosa appeared normal.  There was a diverticulum on the right side and a diverticulum on the left side.  These contained multiple small stones about 2 to 3 mm.  Much of these were simply flushed out of the bladder.  There were no concerning areas for tumor.  Visualization was a bit cloudy and the bladder was large capacity which limited visualization.    Left retrograde pyelogram; this outlined a normal ureter, renal pelvis and collecting system without filling defect, dilation or stricture.  There was brisk drainage on the first retrograde and also after removing the small stone from the left ureteral orifice.  I shot a second retrograde and there was brisk drainage.    Right  retrograde pyelogram; this outlined a normal distal ureter in the mid ureter there was a filling defect consistent with the stone and proximal to this the ureter was dilated, the ureter was tortuous as it entered the renal pelvis and the collecting system was dilated.    DESCRIPTION OF PROCEDURE:  After consent was obtained, the patient was brought to the operating room.  After adequate anesthesia, he was placed in the lithotomy position and prepped and draped in the usual sterile fashion.  A timeout was performed to confirm the patient and procedure.  I did an exam under anesthesia.  The cystoscope was then passed per urethra.  The bladder was drained and carefully inspected.  The diverticulum were inspected.  Stones were drained from the diverticula.  Stone were drained from the bladder.  All these stones were very small.  Inspection of the left ureteral orifice noted a small stone to be obstructing the UO.  Therefore, a sensor wire was advanced beside the stone and the stone went inside the intramural ureter.  The 5 [place] open ended catheter was placed in the left ureteral orifice and left retrograde injection of contrast was performed.  This drained briskly.  Also, the small stone reappeared at the ureteral orifice.  I performed a left retrograde, given the intraoperative findings.  I pulled up the CT scan and again looked and adjacent to the diverticulum and the stones in the diverticulum, there is a faint stone at the left ureterovesical junction.  With the wire in the UO, graspers were passed and the small stone  was plucked out without any difficulty.  The wire was removed and a second retrograde was performed and again there was brisk drainage, no other filling defects or dilation.  Therefore, I decided not to stent the left side.    Attention was then turned to the right ureteral orifice which was cannulated with a 5 [place] open ended catheter.  Retrograde injection of contrast was performed.  A sensor  wire was then advanced, but it got hung up in the proximal ureter tortuosity.  Therefore, the 5 [place] open ended catheter was advanced up into the proximal ureter which sprung the ureter straight and I was able to pass the sensor wire and coil it in the upper pole collecting system without difficulty.  A 6 x 28 cm stent was then advanced and the wire removed.  A good coil was seen in the collecting system and a good coil in the bladder.  The bladder was drained and the scope removed.  The patient was awakened and taken recovery room in stable condition.       COMPLICATIONS:  None.  SPECIMENS:  None.  BLOOD LOSS:  Minimal.   DRAINS:  6 x 28 cm right ureteral stent.     ____________________________ Doyne Keel, MD mre:DT D: 12/27/2014 13:17:33 ET T: 12/27/2014 16:44:24 ET JOB#: [id number]  cc: Doyne Keel, MD, <Dictator> Doyne Keel MD ELECTRONICALLY SIGNED 01/22/2015 17:34

## 2015-02-02 NOTE — Op Note (Signed)
PATIENT NAME:  Joseph [name], Joseph [name] MR#:  [id number] DATE OF BIRTH:  1930-02-07  DATE OF PROCEDURE:  01/20/2015  PREOPERATIVE DIAGNOSIS: Right ureteral stone.    POSTOPERATIVE DIAGNOSIS: Right ureteral stone.   PROCEDURE IN DETAIL: Right ureteroscopy, laser lithotripsy, right ureteral stent removal.   ANESTHESIA: General anesthesia.   ATTENDING SURGEON: Sherlynn Stalls, MD.   ESTIMATED BLOOD LOSS: Minimal.   DRAINS: None.   COMPLICATIONS: None.   SPECIMENS: None.   INDICATION: This is an 81 year old male who initially presented with acute onset right flank pain, who underwent emergent right ureteral stent placement for an approximately 7 mm mid to distal ureteral stone. He presents today for definitive management of his stone. He has had a terrible time with his ureteral stent and under no circumstance wants to have the stent replaced today. He understands the risk for possible severe pain, ureteral damage, stricture formation, sepsis, renal failure, and even death of not having the stent replaced at the discretion of the surgeon.  He understands all of these risks and is willing to proceed with ureteroscopy and laser lithotripsy only without ureteral stent replacement. Both of his daughters agree this plan and this was previously discussed on the phone. Additional risks and benefits of the procedure were explained in detail. The patient agreed to proceed as planned.   PROCEDURE: The patient was correctly identified in the preoperative holding area and informed consent was confirmed. He was then taken to the operating suite and placed on the table in supine position. At this time universal timeout protocol was performed. All team members were identified. Venodyne boots placed and he was administered IV ampicillin and gentamicin in the perioperative period. He was then placed under general anesthesia, repositioned lower on the bed in the dorsal lithotomy position, and prepped and draped in standard  surgical fashion. At this point in time a rigid cystoscope was advanced per urethra into the bladder. Of note there was a fairly significant amount of debris within the bladder and several large mouth diverticula noted. The distal end of the stent emanating from the right ureteral orifice was identified, grasped and brought to the level of the urethral meatus. The stent was then cannulated using a 0.038 Sensor wire up to the level of the kidney. The stent was removed. The wire was snapped in place as a safety wire. At this point in time a semirigid ureteroscope was used and able to be passed within the distal ureter without difficulty. The stone was not immediately identified. There was a fairly tortuous significant bend just below the level of the iliac crest and I did not feel comfortable advancing the scope beyond this level. A second wire was then introduced through the ureteroscope and the rigid ureteroscope was removed. A flexible ureteroscope was then brought in and placed over the working wire just beyond this level until a slight resistance was met, presumably at the stone as this was location of the stone on the previous CT scan. The wire was withdrawn and indeed the stone was immediately identifiable in this exact location. The stone appeared to be fairly embedded into the lateral wall of the ureter, which was edematous and somewhat ragged-appearing prior to any manipulation. At this point in time a 273 micron laser fiber was brought in and using the settings of 0.8 joules and 10 Hz the stone was fragmented. This was somewhat difficult due to the location of the stone and the tortuosity of the ureter, as well as the location of  the stone slightly embedded into the wall. I did on several occasions attempt to grasp the stone using a 1.9 tipless basket just to grab the fragments, but was ultimately not successful in doing this until the stone was fragmented into dust-like particles. I was ultimately able to  pass beyond this area up to the proximal ureter all the way up to the level of the UPJ without difficulty. There were no residual stone fragments noted. Upon backing the scope out the location of the stone did some ragged ureteral mucosa again on the lateral wall where the stone had been previously embedded, but was indeed widely patent without any residual stone fragments noted. More distally within the ureter a small fragment was identified and basketed out. The remainder of the ureter was carefully inspected under direct visualization as the scope was removed, and there were no additional stones noted. A semirigid ureteroscope was then reintroduced back into the distal ureter to insure that there were no fragments within the distal ureter which had been missed. Indeed the ureter was clear of stoned. The safety wire was then removed as the patient had refused ureteral stent placement. The bladder was drained with the sheath of the cystoscope and once the bladder was empty it was removed. The patient was then repositioned from the supine position, reversed from anesthesia, and taken to the PACU in stable condition. There were no complications in this case.     ____________________________ Sherlynn Stalls, MD ajb:bu D: 01/20/2015 16:36:24 ET T: 01/20/2015 20:06:47 ET JOB#: [id number]  cc: Sherlynn Stalls, MD, <Dictator> Sherlynn Stalls MD ELECTRONICALLY SIGNED 01/23/2015 14:52

## 2015-02-02 NOTE — H&P (Signed)
PATIENT NAME:  Joseph [name], Joseph [name] MR#:  [id number] DATE OF BIRTH:  12/13/1929  DATE OF ADMISSION:  12/27/2014  REFERRING PHYSICIAN:  Bobetta LimeEryka A. Inocencio HomesGayle, MD  PRIMARY CARE PHYSICIAN:  Dr. Kevan NyGates out of [place].   CHIEF COMPLAINT:  Right-sided flank pain.   HISTORY OF PRESENT ILLNESS:  This is an 81 year old Caucasian gentleman with a history of 81 year old coronary artery disease status post PCI with stent placement; COPD, non-oxygen dependent; and type 2 diabetes, non-insulin-requiring, presenting with right-sided flank pain. He describes 1-day duration of right-sided flank pain with radiation to the groin. He describes it only as "pain," intensity 10/10. No worsening or relieving factors. He has associated dark urine; however, dark urine has been present for about 2 days in total duration. No further symptomatology.   REVIEW OF SYSTEMS: CONSTITUTIONAL:  Denies fevers, chills, fatigue, or weakness.  EYES:  Denies blurred vision, double vision, or eye pain.  EARS, NOSE, AND THROAT:  Denies tinnitus, ear pain, or hearing loss.  RESPIRATORY:  Denies cough, wheeze, or shortness of breath.  CARDIOVASCULAR:  Denies chest pain, palpitations, or edema.  GASTROINTESTINAL:  Denies nausea, vomiting, diarrhea. Positive for abdominal pain as described above.  GENITOURINARY:  Denies dysuria. Positive for hematuria.  ENDOCRINE:  Denies nocturia or thyroid problems.  HEMATOLOGIC AND ONCOLOGIC:  Denies easy bruising or bleeding. SKIN:  Denies rash or lesion.  MUSCULOSKELETAL:  Denies pain in the neck, back, shoulders, knees, or hips or arthritic symptoms.  NEUROLOGIC:  Denies paralysis or paresthesias.  PSYCHIATRIC:  Denies anxiety or depressive symptoms.   Otherwise, full review of systems performed by me is negative.   PAST MEDICAL HISTORY:  Includes type 2 diabetes, non-insulin dependent; CVA without residual deficit; coronary artery disease status post PCI with stent placement.   SOCIAL HISTORY:  Continued tobacco  use. No alcohol or drug use. [facility] [place].   FAMILY HISTORY:  No known cardiovascular or pulmonary disorders.   ALLERGIES:  LOPRESSOR, NIACIN, AND STATINS, AS WELL AS ZYBAN.   HOME MEDICATIONS:  Metformin 500 mg p.o. b.i.d., Plavix 75 mg p.o. daily, Spiriva 18 mcg inhalation daily, albuterol 3 times daily every 4 hours as needed for shortness of breath, Travatan 0.004% ophthalmic solution to each eye daily, vitamin D3 at 1000 international units daily.   PHYSICAL EXAMINATION: VITAL SIGNS:  Temperature 97.6, heart rate 95, respirations 19, blood pressure 121/84, saturating 94% on room air. Weight 60.6 kg, BMI 18.6.  GENERAL:  Chronically ill-appearing Caucasian gentleman, currently in no acute distress.  HEAD:  Normocephalic, atraumatic.  EYES:  Pupils are equal, round, and reactive to light. Extraocular muscles are intact. No scleral icterus.  MOUTH:  Dry mucosal membrane. Dentition is poor. No abscess noted.  EARS, NOSE, AND THROAT:  Clear without exudates. No external lesions.  NECK:  Supple. No thyromegaly. No nodules. No JVD.  PULMONARY:  Clear to auscultation bilaterally without wheezes, rales, or rhonchi. No use of accessory muscles. Good respiratory effort.  CHEST:  Nontender to palpation.  CARDIOVASCULAR:  S1 and S2, regular rate and rhythm. No murmurs, rubs, or gallops. No edema. Pedal pulses are 2+ bilaterally.  GASTROINTESTINAL:  Soft, nontender, nondistended. No masses. Positive bowel sounds. No hepatosplenomegaly.  MUSCULOSKELETAL:  No swelling, clubbing, or edema. Range of motion is full in all extremities.  NEUROLOGIC:  Cranial nerves II through XII are intact. No gross focal neurological deficits. Sensation is intact. Reflexes are intact.   SKIN:  No ulcerations, lesions, rashes, or cyanosis. Skin is warm and  dry. Turgor is intact.  PSYCHIATRIC:  Mood and affect are agitated. Awake, alert, and oriented x 3. Insight and judgment are intact.    LABORATORY DATA:  Sodium 141, potassium 4.5, chloride 102, bicarbonate 28, BUN 23, creatinine 1.04, glucose 210, LFTs within normal limits. WBC 15.5, hemoglobin 15, platelets 284,000.   Urinalysis:  WBCs 245, RBCs 1816, leukocyte esterase 2+, epithelial cells none.   CT of the abdomen and pelvis performed:  Moderate-to-severe right-sided hydronephrosis with diffuse right-sided perinephric stranding and fluid with an obstructing 7 x 6 mm stone in the mid-to-distal right ureter.   ASSESSMENT AND PLAN:  This is an 81 year old Caucasian gentleman with a history of 81 year old coronary artery disease status post percutaneous coronary intervention with stent placement and type 2 diabetes, non-insulin-requiring, presenting with right-sided flank pain.   1.  Right-sided hydronephrosis secondary to obstructive ureteral stone. The case was discussed with urology in the Emergency Department, who recommended hospital admission to provide pain control with ceftriaxone for antibiotic coverage. Follow culture data. We will also consult urology for further management.  2.  Type 2 diabetes. Hold p.o. agents. Add insulin sliding scale with q. 6 hour Accu-Cheks.  3.  Chronic obstructive pulmonary disease, not in acute exacerbation. We will continue with Spiriva and supplemental oxygen as required.  4.  Venous thromboembolism prophylaxis with sequential compression devices.   CODE STATUS:  The patient is DO NOT RESUSCITATE.   TIME SPENT:  35 minutes.    ____________________________ Cletis Athens. Hower, MD dkh:nb D: 12/27/2014 04:07:38 ET T: 12/27/2014 05:41:36 ET JOB#: [id number]  cc: Cletis Athens. Hower, MD, <Dictator> DAVID Synetta Shadow MD ELECTRONICALLY SIGNED 12/28/2014 1:34

## 2015-02-02 NOTE — Consult Note (Signed)
Brief Consult Note: Diagnosis: right mid-ureteral stone, right hydronephrosis, right flank pain.   Patient was seen by consultant.   Recommend to proceed with surgery or procedure.   Orders entered.   Comments: Pt with right flank pain. No h/o kidney stones. CT revealed 6-7 mm right mid-ureteral stone. Due to age, DM, white count admitted for obs. He continues to have pain. He is with his daughter.   H/o of BPH s/p TURP. Voids with weak stream. Bladder tics on CT. Bladder stones.   i reviewed notes, labs, CT images.   a/p - right mid-ureteral stone, right hydronephrosis, right flank pain - i discussed with patient and daughter nature, risks benefits and alternatives to cystoscopy, right RGP, right ureteral stent. Discussed alternatives such as continued stone passage adding tamsulosin or right PCNx. I drew them a picture of the anatomy and procedure. Discussed rationale for staged procedure/pre-stent and importance of f/u with stent for definitive stone management. Discussed failure to gain retrograde access due to prior surgery, BPH. All questions answered. They elect to proceed with cystoscopy, right RGP, right ureteral stent.  Electronic Signatures: Doyne KeelEskridge, Aashi Derrington R (MD)  (Signed 25-Mar-16 11:13)  Authored: Brief Consult Note   Last Updated: 25-Mar-16 11:13 by Doyne KeelEskridge, Maymie Brunke R (MD)

## 2015-02-17 ENCOUNTER — Ambulatory Visit: Payer: Self-pay

## 2015-03-05 DIAGNOSIS — E785 Hyperlipidemia, unspecified: Secondary | ICD-10-CM | POA: Diagnosis not present

## 2015-03-05 DIAGNOSIS — E119 Type 2 diabetes mellitus without complications: Secondary | ICD-10-CM | POA: Diagnosis not present

## 2015-03-05 DIAGNOSIS — N400 Enlarged prostate without lower urinary tract symptoms: Secondary | ICD-10-CM | POA: Diagnosis not present

## 2015-03-05 DIAGNOSIS — J449 Chronic obstructive pulmonary disease, unspecified: Secondary | ICD-10-CM | POA: Diagnosis not present

## 2015-03-05 DIAGNOSIS — Z66 Do not resuscitate: Secondary | ICD-10-CM | POA: Diagnosis not present

## 2015-03-05 DIAGNOSIS — I10 Essential (primary) hypertension: Secondary | ICD-10-CM | POA: Diagnosis not present

## 2015-03-05 DIAGNOSIS — I2510 Atherosclerotic heart disease of native coronary artery without angina pectoris: Secondary | ICD-10-CM | POA: Diagnosis not present

## 2015-03-05 DIAGNOSIS — H409 Unspecified glaucoma: Secondary | ICD-10-CM | POA: Diagnosis not present

## 2015-03-25 DIAGNOSIS — H4011X2 Primary open-angle glaucoma, moderate stage: Secondary | ICD-10-CM | POA: Diagnosis not present

## 2015-04-04 DIAGNOSIS — I10 Essential (primary) hypertension: Secondary | ICD-10-CM | POA: Diagnosis not present

## 2015-04-04 DIAGNOSIS — E119 Type 2 diabetes mellitus without complications: Secondary | ICD-10-CM | POA: Diagnosis not present

## 2015-04-04 DIAGNOSIS — N400 Enlarged prostate without lower urinary tract symptoms: Secondary | ICD-10-CM | POA: Diagnosis not present

## 2015-04-04 DIAGNOSIS — E785 Hyperlipidemia, unspecified: Secondary | ICD-10-CM | POA: Diagnosis not present

## 2015-04-04 DIAGNOSIS — I2510 Atherosclerotic heart disease of native coronary artery without angina pectoris: Secondary | ICD-10-CM | POA: Diagnosis not present

## 2015-04-04 DIAGNOSIS — H409 Unspecified glaucoma: Secondary | ICD-10-CM | POA: Diagnosis not present

## 2015-04-04 DIAGNOSIS — Z66 Do not resuscitate: Secondary | ICD-10-CM | POA: Diagnosis not present

## 2015-04-04 DIAGNOSIS — J449 Chronic obstructive pulmonary disease, unspecified: Secondary | ICD-10-CM | POA: Diagnosis not present

## 2015-05-05 DIAGNOSIS — I2510 Atherosclerotic heart disease of native coronary artery without angina pectoris: Secondary | ICD-10-CM | POA: Diagnosis not present

## 2015-05-05 DIAGNOSIS — J449 Chronic obstructive pulmonary disease, unspecified: Secondary | ICD-10-CM | POA: Diagnosis not present

## 2015-05-05 DIAGNOSIS — H409 Unspecified glaucoma: Secondary | ICD-10-CM | POA: Diagnosis not present

## 2015-05-05 DIAGNOSIS — E785 Hyperlipidemia, unspecified: Secondary | ICD-10-CM | POA: Diagnosis not present

## 2015-05-05 DIAGNOSIS — N400 Enlarged prostate without lower urinary tract symptoms: Secondary | ICD-10-CM | POA: Diagnosis not present

## 2015-05-05 DIAGNOSIS — E119 Type 2 diabetes mellitus without complications: Secondary | ICD-10-CM | POA: Diagnosis not present

## 2015-05-05 DIAGNOSIS — Z66 Do not resuscitate: Secondary | ICD-10-CM | POA: Diagnosis not present

## 2015-05-05 DIAGNOSIS — I10 Essential (primary) hypertension: Secondary | ICD-10-CM | POA: Diagnosis not present

## 2015-06-05 DIAGNOSIS — I2510 Atherosclerotic heart disease of native coronary artery without angina pectoris: Secondary | ICD-10-CM | POA: Diagnosis not present

## 2015-06-05 DIAGNOSIS — J449 Chronic obstructive pulmonary disease, unspecified: Secondary | ICD-10-CM | POA: Diagnosis not present

## 2015-06-05 DIAGNOSIS — E119 Type 2 diabetes mellitus without complications: Secondary | ICD-10-CM | POA: Diagnosis not present

## 2015-06-05 DIAGNOSIS — E785 Hyperlipidemia, unspecified: Secondary | ICD-10-CM | POA: Diagnosis not present

## 2015-06-05 DIAGNOSIS — I10 Essential (primary) hypertension: Secondary | ICD-10-CM | POA: Diagnosis not present

## 2015-06-05 DIAGNOSIS — N400 Enlarged prostate without lower urinary tract symptoms: Secondary | ICD-10-CM | POA: Diagnosis not present

## 2015-06-05 DIAGNOSIS — H409 Unspecified glaucoma: Secondary | ICD-10-CM | POA: Diagnosis not present

## 2015-07-05 DIAGNOSIS — N400 Enlarged prostate without lower urinary tract symptoms: Secondary | ICD-10-CM | POA: Diagnosis not present

## 2015-07-05 DIAGNOSIS — I2510 Atherosclerotic heart disease of native coronary artery without angina pectoris: Secondary | ICD-10-CM | POA: Diagnosis not present

## 2015-07-05 DIAGNOSIS — H409 Unspecified glaucoma: Secondary | ICD-10-CM | POA: Diagnosis not present

## 2015-07-05 DIAGNOSIS — I10 Essential (primary) hypertension: Secondary | ICD-10-CM | POA: Diagnosis not present

## 2015-07-05 DIAGNOSIS — J449 Chronic obstructive pulmonary disease, unspecified: Secondary | ICD-10-CM | POA: Diagnosis not present

## 2015-07-05 DIAGNOSIS — E119 Type 2 diabetes mellitus without complications: Secondary | ICD-10-CM | POA: Diagnosis not present

## 2015-07-05 DIAGNOSIS — E785 Hyperlipidemia, unspecified: Secondary | ICD-10-CM | POA: Diagnosis not present

## 2015-07-22 DIAGNOSIS — Z23 Encounter for immunization: Secondary | ICD-10-CM | POA: Diagnosis not present

## 2015-07-22 DIAGNOSIS — H401132 Primary open-angle glaucoma, bilateral, moderate stage: Secondary | ICD-10-CM | POA: Diagnosis not present

## 2015-08-05 DIAGNOSIS — H409 Unspecified glaucoma: Secondary | ICD-10-CM | POA: Diagnosis not present

## 2015-08-05 DIAGNOSIS — E785 Hyperlipidemia, unspecified: Secondary | ICD-10-CM | POA: Diagnosis not present

## 2015-08-05 DIAGNOSIS — I10 Essential (primary) hypertension: Secondary | ICD-10-CM | POA: Diagnosis not present

## 2015-08-05 DIAGNOSIS — I2510 Atherosclerotic heart disease of native coronary artery without angina pectoris: Secondary | ICD-10-CM | POA: Diagnosis not present

## 2015-08-05 DIAGNOSIS — E119 Type 2 diabetes mellitus without complications: Secondary | ICD-10-CM | POA: Diagnosis not present

## 2015-08-05 DIAGNOSIS — J449 Chronic obstructive pulmonary disease, unspecified: Secondary | ICD-10-CM | POA: Diagnosis not present

## 2015-08-05 DIAGNOSIS — N400 Enlarged prostate without lower urinary tract symptoms: Secondary | ICD-10-CM | POA: Diagnosis not present

## 2015-08-11 DIAGNOSIS — R600 Localized edema: Secondary | ICD-10-CM | POA: Diagnosis not present

## 2015-08-22 DIAGNOSIS — N400 Enlarged prostate without lower urinary tract symptoms: Secondary | ICD-10-CM | POA: Diagnosis not present

## 2015-08-22 DIAGNOSIS — R600 Localized edema: Secondary | ICD-10-CM | POA: Diagnosis not present

## 2015-08-22 DIAGNOSIS — J449 Chronic obstructive pulmonary disease, unspecified: Secondary | ICD-10-CM | POA: Diagnosis not present

## 2015-08-22 DIAGNOSIS — E782 Mixed hyperlipidemia: Secondary | ICD-10-CM | POA: Diagnosis not present

## 2015-08-22 DIAGNOSIS — Z0001 Encounter for general adult medical examination with abnormal findings: Secondary | ICD-10-CM | POA: Diagnosis not present

## 2015-08-22 DIAGNOSIS — Z1389 Encounter for screening for other disorder: Secondary | ICD-10-CM | POA: Diagnosis not present

## 2015-08-22 DIAGNOSIS — E119 Type 2 diabetes mellitus without complications: Secondary | ICD-10-CM | POA: Diagnosis not present

## 2015-08-22 DIAGNOSIS — E559 Vitamin D deficiency, unspecified: Secondary | ICD-10-CM | POA: Diagnosis not present

## 2015-08-22 DIAGNOSIS — I10 Essential (primary) hypertension: Secondary | ICD-10-CM | POA: Diagnosis not present

## 2015-08-22 DIAGNOSIS — E042 Nontoxic multinodular goiter: Secondary | ICD-10-CM | POA: Diagnosis not present

## 2015-08-22 DIAGNOSIS — I714 Abdominal aortic aneurysm, without rupture: Secondary | ICD-10-CM | POA: Diagnosis not present

## 2015-09-04 DIAGNOSIS — I2510 Atherosclerotic heart disease of native coronary artery without angina pectoris: Secondary | ICD-10-CM | POA: Diagnosis not present

## 2015-09-04 DIAGNOSIS — E785 Hyperlipidemia, unspecified: Secondary | ICD-10-CM | POA: Diagnosis not present

## 2015-09-04 DIAGNOSIS — I10 Essential (primary) hypertension: Secondary | ICD-10-CM | POA: Diagnosis not present

## 2015-09-04 DIAGNOSIS — E119 Type 2 diabetes mellitus without complications: Secondary | ICD-10-CM | POA: Diagnosis not present

## 2015-09-04 DIAGNOSIS — H409 Unspecified glaucoma: Secondary | ICD-10-CM | POA: Diagnosis not present

## 2015-09-04 DIAGNOSIS — N400 Enlarged prostate without lower urinary tract symptoms: Secondary | ICD-10-CM | POA: Diagnosis not present

## 2015-09-04 DIAGNOSIS — J449 Chronic obstructive pulmonary disease, unspecified: Secondary | ICD-10-CM | POA: Diagnosis not present

## 2015-10-05 DIAGNOSIS — N400 Enlarged prostate without lower urinary tract symptoms: Secondary | ICD-10-CM | POA: Diagnosis not present

## 2015-10-05 DIAGNOSIS — I2510 Atherosclerotic heart disease of native coronary artery without angina pectoris: Secondary | ICD-10-CM | POA: Diagnosis not present

## 2015-10-05 DIAGNOSIS — E785 Hyperlipidemia, unspecified: Secondary | ICD-10-CM | POA: Diagnosis not present

## 2015-10-05 DIAGNOSIS — H409 Unspecified glaucoma: Secondary | ICD-10-CM | POA: Diagnosis not present

## 2015-10-05 DIAGNOSIS — J449 Chronic obstructive pulmonary disease, unspecified: Secondary | ICD-10-CM | POA: Diagnosis not present

## 2015-10-05 DIAGNOSIS — E119 Type 2 diabetes mellitus without complications: Secondary | ICD-10-CM | POA: Diagnosis not present

## 2015-10-05 DIAGNOSIS — I10 Essential (primary) hypertension: Secondary | ICD-10-CM | POA: Diagnosis not present

## 2015-10-06 DIAGNOSIS — I2510 Atherosclerotic heart disease of native coronary artery without angina pectoris: Secondary | ICD-10-CM | POA: Diagnosis not present

## 2015-10-06 DIAGNOSIS — I10 Essential (primary) hypertension: Secondary | ICD-10-CM | POA: Diagnosis not present

## 2015-10-06 DIAGNOSIS — E785 Hyperlipidemia, unspecified: Secondary | ICD-10-CM | POA: Diagnosis not present

## 2015-10-06 DIAGNOSIS — E119 Type 2 diabetes mellitus without complications: Secondary | ICD-10-CM | POA: Diagnosis not present

## 2015-10-06 DIAGNOSIS — N400 Enlarged prostate without lower urinary tract symptoms: Secondary | ICD-10-CM | POA: Diagnosis not present

## 2015-10-06 DIAGNOSIS — J449 Chronic obstructive pulmonary disease, unspecified: Secondary | ICD-10-CM | POA: Diagnosis not present

## 2015-10-07 DIAGNOSIS — N400 Enlarged prostate without lower urinary tract symptoms: Secondary | ICD-10-CM | POA: Diagnosis not present

## 2015-10-07 DIAGNOSIS — E119 Type 2 diabetes mellitus without complications: Secondary | ICD-10-CM | POA: Diagnosis not present

## 2015-10-07 DIAGNOSIS — E785 Hyperlipidemia, unspecified: Secondary | ICD-10-CM | POA: Diagnosis not present

## 2015-10-07 DIAGNOSIS — I2510 Atherosclerotic heart disease of native coronary artery without angina pectoris: Secondary | ICD-10-CM | POA: Diagnosis not present

## 2015-10-07 DIAGNOSIS — J449 Chronic obstructive pulmonary disease, unspecified: Secondary | ICD-10-CM | POA: Diagnosis not present

## 2015-10-07 DIAGNOSIS — I10 Essential (primary) hypertension: Secondary | ICD-10-CM | POA: Diagnosis not present

## 2015-10-08 DIAGNOSIS — N400 Enlarged prostate without lower urinary tract symptoms: Secondary | ICD-10-CM | POA: Diagnosis not present

## 2015-10-08 DIAGNOSIS — I2510 Atherosclerotic heart disease of native coronary artery without angina pectoris: Secondary | ICD-10-CM | POA: Diagnosis not present

## 2015-10-08 DIAGNOSIS — E785 Hyperlipidemia, unspecified: Secondary | ICD-10-CM | POA: Diagnosis not present

## 2015-10-08 DIAGNOSIS — I10 Essential (primary) hypertension: Secondary | ICD-10-CM | POA: Diagnosis not present

## 2015-10-08 DIAGNOSIS — J449 Chronic obstructive pulmonary disease, unspecified: Secondary | ICD-10-CM | POA: Diagnosis not present

## 2015-10-08 DIAGNOSIS — E119 Type 2 diabetes mellitus without complications: Secondary | ICD-10-CM | POA: Diagnosis not present

## 2015-10-09 DIAGNOSIS — E785 Hyperlipidemia, unspecified: Secondary | ICD-10-CM | POA: Diagnosis not present

## 2015-10-09 DIAGNOSIS — I10 Essential (primary) hypertension: Secondary | ICD-10-CM | POA: Diagnosis not present

## 2015-10-09 DIAGNOSIS — J449 Chronic obstructive pulmonary disease, unspecified: Secondary | ICD-10-CM | POA: Diagnosis not present

## 2015-10-09 DIAGNOSIS — E119 Type 2 diabetes mellitus without complications: Secondary | ICD-10-CM | POA: Diagnosis not present

## 2015-10-09 DIAGNOSIS — N400 Enlarged prostate without lower urinary tract symptoms: Secondary | ICD-10-CM | POA: Diagnosis not present

## 2015-10-09 DIAGNOSIS — I2510 Atherosclerotic heart disease of native coronary artery without angina pectoris: Secondary | ICD-10-CM | POA: Diagnosis not present

## 2015-10-10 DIAGNOSIS — E785 Hyperlipidemia, unspecified: Secondary | ICD-10-CM | POA: Diagnosis not present

## 2015-10-10 DIAGNOSIS — E119 Type 2 diabetes mellitus without complications: Secondary | ICD-10-CM | POA: Diagnosis not present

## 2015-10-10 DIAGNOSIS — J449 Chronic obstructive pulmonary disease, unspecified: Secondary | ICD-10-CM | POA: Diagnosis not present

## 2015-10-10 DIAGNOSIS — I2510 Atherosclerotic heart disease of native coronary artery without angina pectoris: Secondary | ICD-10-CM | POA: Diagnosis not present

## 2015-10-10 DIAGNOSIS — N400 Enlarged prostate without lower urinary tract symptoms: Secondary | ICD-10-CM | POA: Diagnosis not present

## 2015-10-10 DIAGNOSIS — I10 Essential (primary) hypertension: Secondary | ICD-10-CM | POA: Diagnosis not present

## 2015-10-13 DIAGNOSIS — J449 Chronic obstructive pulmonary disease, unspecified: Secondary | ICD-10-CM | POA: Diagnosis not present

## 2015-10-13 DIAGNOSIS — N400 Enlarged prostate without lower urinary tract symptoms: Secondary | ICD-10-CM | POA: Diagnosis not present

## 2015-10-13 DIAGNOSIS — E119 Type 2 diabetes mellitus without complications: Secondary | ICD-10-CM | POA: Diagnosis not present

## 2015-10-13 DIAGNOSIS — I2510 Atherosclerotic heart disease of native coronary artery without angina pectoris: Secondary | ICD-10-CM | POA: Diagnosis not present

## 2015-10-13 DIAGNOSIS — E785 Hyperlipidemia, unspecified: Secondary | ICD-10-CM | POA: Diagnosis not present

## 2015-10-13 DIAGNOSIS — I10 Essential (primary) hypertension: Secondary | ICD-10-CM | POA: Diagnosis not present

## 2015-10-14 DIAGNOSIS — E785 Hyperlipidemia, unspecified: Secondary | ICD-10-CM | POA: Diagnosis not present

## 2015-10-14 DIAGNOSIS — N400 Enlarged prostate without lower urinary tract symptoms: Secondary | ICD-10-CM | POA: Diagnosis not present

## 2015-10-14 DIAGNOSIS — I10 Essential (primary) hypertension: Secondary | ICD-10-CM | POA: Diagnosis not present

## 2015-10-14 DIAGNOSIS — J449 Chronic obstructive pulmonary disease, unspecified: Secondary | ICD-10-CM | POA: Diagnosis not present

## 2015-10-14 DIAGNOSIS — E119 Type 2 diabetes mellitus without complications: Secondary | ICD-10-CM | POA: Diagnosis not present

## 2015-10-14 DIAGNOSIS — I2510 Atherosclerotic heart disease of native coronary artery without angina pectoris: Secondary | ICD-10-CM | POA: Diagnosis not present

## 2015-10-15 DIAGNOSIS — J449 Chronic obstructive pulmonary disease, unspecified: Secondary | ICD-10-CM | POA: Diagnosis not present

## 2015-10-15 DIAGNOSIS — I10 Essential (primary) hypertension: Secondary | ICD-10-CM | POA: Diagnosis not present

## 2015-10-15 DIAGNOSIS — E785 Hyperlipidemia, unspecified: Secondary | ICD-10-CM | POA: Diagnosis not present

## 2015-10-15 DIAGNOSIS — I2510 Atherosclerotic heart disease of native coronary artery without angina pectoris: Secondary | ICD-10-CM | POA: Diagnosis not present

## 2015-10-15 DIAGNOSIS — N400 Enlarged prostate without lower urinary tract symptoms: Secondary | ICD-10-CM | POA: Diagnosis not present

## 2015-10-15 DIAGNOSIS — E119 Type 2 diabetes mellitus without complications: Secondary | ICD-10-CM | POA: Diagnosis not present

## 2015-10-16 DIAGNOSIS — N400 Enlarged prostate without lower urinary tract symptoms: Secondary | ICD-10-CM | POA: Diagnosis not present

## 2015-10-16 DIAGNOSIS — J449 Chronic obstructive pulmonary disease, unspecified: Secondary | ICD-10-CM | POA: Diagnosis not present

## 2015-10-16 DIAGNOSIS — E785 Hyperlipidemia, unspecified: Secondary | ICD-10-CM | POA: Diagnosis not present

## 2015-10-16 DIAGNOSIS — I2510 Atherosclerotic heart disease of native coronary artery without angina pectoris: Secondary | ICD-10-CM | POA: Diagnosis not present

## 2015-10-16 DIAGNOSIS — I10 Essential (primary) hypertension: Secondary | ICD-10-CM | POA: Diagnosis not present

## 2015-10-16 DIAGNOSIS — E119 Type 2 diabetes mellitus without complications: Secondary | ICD-10-CM | POA: Diagnosis not present

## 2015-10-17 DIAGNOSIS — E119 Type 2 diabetes mellitus without complications: Secondary | ICD-10-CM | POA: Diagnosis not present

## 2015-10-17 DIAGNOSIS — E785 Hyperlipidemia, unspecified: Secondary | ICD-10-CM | POA: Diagnosis not present

## 2015-10-17 DIAGNOSIS — I10 Essential (primary) hypertension: Secondary | ICD-10-CM | POA: Diagnosis not present

## 2015-10-17 DIAGNOSIS — J449 Chronic obstructive pulmonary disease, unspecified: Secondary | ICD-10-CM | POA: Diagnosis not present

## 2015-10-17 DIAGNOSIS — I2510 Atherosclerotic heart disease of native coronary artery without angina pectoris: Secondary | ICD-10-CM | POA: Diagnosis not present

## 2015-10-17 DIAGNOSIS — N400 Enlarged prostate without lower urinary tract symptoms: Secondary | ICD-10-CM | POA: Diagnosis not present

## 2015-11-21 DIAGNOSIS — I10 Essential (primary) hypertension: Secondary | ICD-10-CM | POA: Diagnosis not present

## 2015-11-21 DIAGNOSIS — E1165 Type 2 diabetes mellitus with hyperglycemia: Secondary | ICD-10-CM | POA: Diagnosis not present

## 2015-11-21 DIAGNOSIS — E559 Vitamin D deficiency, unspecified: Secondary | ICD-10-CM | POA: Diagnosis not present

## 2015-11-21 DIAGNOSIS — J449 Chronic obstructive pulmonary disease, unspecified: Secondary | ICD-10-CM | POA: Diagnosis not present

## 2015-11-21 DIAGNOSIS — E782 Mixed hyperlipidemia: Secondary | ICD-10-CM | POA: Diagnosis not present

## 2015-11-21 DIAGNOSIS — D509 Iron deficiency anemia, unspecified: Secondary | ICD-10-CM | POA: Diagnosis not present

## 2015-11-21 DIAGNOSIS — E0520 Thyrotoxicosis with toxic multinodular goiter without thyrotoxic crisis or storm: Secondary | ICD-10-CM | POA: Diagnosis not present

## 2015-11-21 DIAGNOSIS — R5382 Chronic fatigue, unspecified: Secondary | ICD-10-CM | POA: Diagnosis not present

## 2015-11-21 DIAGNOSIS — N390 Urinary tract infection, site not specified: Secondary | ICD-10-CM | POA: Diagnosis not present

## 2015-12-04 DIAGNOSIS — R300 Dysuria: Secondary | ICD-10-CM | POA: Diagnosis not present

## 2015-12-04 DIAGNOSIS — R351 Nocturia: Secondary | ICD-10-CM | POA: Diagnosis not present

## 2015-12-04 DIAGNOSIS — N390 Urinary tract infection, site not specified: Secondary | ICD-10-CM | POA: Diagnosis not present

## 2015-12-04 DIAGNOSIS — N401 Enlarged prostate with lower urinary tract symptoms: Secondary | ICD-10-CM | POA: Diagnosis not present

## 2015-12-04 DIAGNOSIS — Z0000 Encounter for general adult medical examination without abnormal findings: Secondary | ICD-10-CM | POA: Diagnosis not present

## 2015-12-04 DIAGNOSIS — R310 Gross hematuria: Secondary | ICD-10-CM | POA: Diagnosis not present

## 2015-12-11 DIAGNOSIS — J449 Chronic obstructive pulmonary disease, unspecified: Secondary | ICD-10-CM | POA: Diagnosis not present

## 2016-01-12 DIAGNOSIS — N401 Enlarged prostate with lower urinary tract symptoms: Secondary | ICD-10-CM | POA: Diagnosis not present

## 2016-01-12 DIAGNOSIS — Z0000 Encounter for general adult medical examination without abnormal findings: Secondary | ICD-10-CM | POA: Diagnosis not present

## 2016-01-12 DIAGNOSIS — R351 Nocturia: Secondary | ICD-10-CM | POA: Diagnosis not present

## 2016-01-12 DIAGNOSIS — R300 Dysuria: Secondary | ICD-10-CM | POA: Diagnosis not present

## 2016-02-03 DIAGNOSIS — Z8673 Personal history of transient ischemic attack (TIA), and cerebral infarction without residual deficits: Secondary | ICD-10-CM | POA: Diagnosis not present

## 2016-02-03 DIAGNOSIS — H409 Unspecified glaucoma: Secondary | ICD-10-CM | POA: Diagnosis not present

## 2016-02-03 DIAGNOSIS — I252 Old myocardial infarction: Secondary | ICD-10-CM | POA: Diagnosis not present

## 2016-02-03 DIAGNOSIS — Z9981 Dependence on supplemental oxygen: Secondary | ICD-10-CM | POA: Diagnosis not present

## 2016-02-03 DIAGNOSIS — Z87891 Personal history of nicotine dependence: Secondary | ICD-10-CM | POA: Diagnosis not present

## 2016-02-03 DIAGNOSIS — D509 Iron deficiency anemia, unspecified: Secondary | ICD-10-CM | POA: Diagnosis not present

## 2016-02-03 DIAGNOSIS — Z87440 Personal history of urinary (tract) infections: Secondary | ICD-10-CM | POA: Diagnosis not present

## 2016-02-03 DIAGNOSIS — E1165 Type 2 diabetes mellitus with hyperglycemia: Secondary | ICD-10-CM | POA: Diagnosis not present

## 2016-02-03 DIAGNOSIS — E559 Vitamin D deficiency, unspecified: Secondary | ICD-10-CM | POA: Diagnosis not present

## 2016-02-03 DIAGNOSIS — N400 Enlarged prostate without lower urinary tract symptoms: Secondary | ICD-10-CM | POA: Diagnosis not present

## 2016-02-03 DIAGNOSIS — I2510 Atherosclerotic heart disease of native coronary artery without angina pectoris: Secondary | ICD-10-CM | POA: Diagnosis not present

## 2016-02-03 DIAGNOSIS — J441 Chronic obstructive pulmonary disease with (acute) exacerbation: Secondary | ICD-10-CM | POA: Diagnosis not present

## 2016-02-03 DIAGNOSIS — I10 Essential (primary) hypertension: Secondary | ICD-10-CM | POA: Diagnosis not present

## 2016-02-03 DIAGNOSIS — E782 Mixed hyperlipidemia: Secondary | ICD-10-CM | POA: Diagnosis not present

## 2016-02-04 DIAGNOSIS — I2510 Atherosclerotic heart disease of native coronary artery without angina pectoris: Secondary | ICD-10-CM | POA: Diagnosis not present

## 2016-02-04 DIAGNOSIS — I10 Essential (primary) hypertension: Secondary | ICD-10-CM | POA: Diagnosis not present

## 2016-02-04 DIAGNOSIS — J441 Chronic obstructive pulmonary disease with (acute) exacerbation: Secondary | ICD-10-CM | POA: Diagnosis not present

## 2016-02-04 DIAGNOSIS — D509 Iron deficiency anemia, unspecified: Secondary | ICD-10-CM | POA: Diagnosis not present

## 2016-02-04 DIAGNOSIS — Z9981 Dependence on supplemental oxygen: Secondary | ICD-10-CM | POA: Diagnosis not present

## 2016-02-04 DIAGNOSIS — E1165 Type 2 diabetes mellitus with hyperglycemia: Secondary | ICD-10-CM | POA: Diagnosis not present

## 2016-02-05 DIAGNOSIS — I10 Essential (primary) hypertension: Secondary | ICD-10-CM | POA: Diagnosis not present

## 2016-02-05 DIAGNOSIS — Z9981 Dependence on supplemental oxygen: Secondary | ICD-10-CM | POA: Diagnosis not present

## 2016-02-05 DIAGNOSIS — E1165 Type 2 diabetes mellitus with hyperglycemia: Secondary | ICD-10-CM | POA: Diagnosis not present

## 2016-02-05 DIAGNOSIS — J441 Chronic obstructive pulmonary disease with (acute) exacerbation: Secondary | ICD-10-CM | POA: Diagnosis not present

## 2016-02-05 DIAGNOSIS — D509 Iron deficiency anemia, unspecified: Secondary | ICD-10-CM | POA: Diagnosis not present

## 2016-02-05 DIAGNOSIS — I2510 Atherosclerotic heart disease of native coronary artery without angina pectoris: Secondary | ICD-10-CM | POA: Diagnosis not present

## 2016-02-06 DIAGNOSIS — D509 Iron deficiency anemia, unspecified: Secondary | ICD-10-CM | POA: Diagnosis not present

## 2016-02-06 DIAGNOSIS — I10 Essential (primary) hypertension: Secondary | ICD-10-CM | POA: Diagnosis not present

## 2016-02-06 DIAGNOSIS — I2510 Atherosclerotic heart disease of native coronary artery without angina pectoris: Secondary | ICD-10-CM | POA: Diagnosis not present

## 2016-02-06 DIAGNOSIS — E1165 Type 2 diabetes mellitus with hyperglycemia: Secondary | ICD-10-CM | POA: Diagnosis not present

## 2016-02-06 DIAGNOSIS — Z9981 Dependence on supplemental oxygen: Secondary | ICD-10-CM | POA: Diagnosis not present

## 2016-02-06 DIAGNOSIS — J441 Chronic obstructive pulmonary disease with (acute) exacerbation: Secondary | ICD-10-CM | POA: Diagnosis not present

## 2016-02-09 DIAGNOSIS — I2510 Atherosclerotic heart disease of native coronary artery without angina pectoris: Secondary | ICD-10-CM | POA: Diagnosis not present

## 2016-02-09 DIAGNOSIS — I10 Essential (primary) hypertension: Secondary | ICD-10-CM | POA: Diagnosis not present

## 2016-02-09 DIAGNOSIS — Z9981 Dependence on supplemental oxygen: Secondary | ICD-10-CM | POA: Diagnosis not present

## 2016-02-09 DIAGNOSIS — E1165 Type 2 diabetes mellitus with hyperglycemia: Secondary | ICD-10-CM | POA: Diagnosis not present

## 2016-02-09 DIAGNOSIS — J441 Chronic obstructive pulmonary disease with (acute) exacerbation: Secondary | ICD-10-CM | POA: Diagnosis not present

## 2016-02-09 DIAGNOSIS — D509 Iron deficiency anemia, unspecified: Secondary | ICD-10-CM | POA: Diagnosis not present

## 2016-02-10 DIAGNOSIS — Z9981 Dependence on supplemental oxygen: Secondary | ICD-10-CM | POA: Diagnosis not present

## 2016-02-10 DIAGNOSIS — D509 Iron deficiency anemia, unspecified: Secondary | ICD-10-CM | POA: Diagnosis not present

## 2016-02-10 DIAGNOSIS — I2510 Atherosclerotic heart disease of native coronary artery without angina pectoris: Secondary | ICD-10-CM | POA: Diagnosis not present

## 2016-02-10 DIAGNOSIS — I10 Essential (primary) hypertension: Secondary | ICD-10-CM | POA: Diagnosis not present

## 2016-02-10 DIAGNOSIS — E1165 Type 2 diabetes mellitus with hyperglycemia: Secondary | ICD-10-CM | POA: Diagnosis not present

## 2016-02-10 DIAGNOSIS — J441 Chronic obstructive pulmonary disease with (acute) exacerbation: Secondary | ICD-10-CM | POA: Diagnosis not present

## 2016-02-11 DIAGNOSIS — I10 Essential (primary) hypertension: Secondary | ICD-10-CM | POA: Diagnosis not present

## 2016-02-11 DIAGNOSIS — D509 Iron deficiency anemia, unspecified: Secondary | ICD-10-CM | POA: Diagnosis not present

## 2016-02-11 DIAGNOSIS — I2510 Atherosclerotic heart disease of native coronary artery without angina pectoris: Secondary | ICD-10-CM | POA: Diagnosis not present

## 2016-02-11 DIAGNOSIS — J441 Chronic obstructive pulmonary disease with (acute) exacerbation: Secondary | ICD-10-CM | POA: Diagnosis not present

## 2016-02-11 DIAGNOSIS — E1165 Type 2 diabetes mellitus with hyperglycemia: Secondary | ICD-10-CM | POA: Diagnosis not present

## 2016-02-11 DIAGNOSIS — Z9981 Dependence on supplemental oxygen: Secondary | ICD-10-CM | POA: Diagnosis not present

## 2016-02-12 DIAGNOSIS — D509 Iron deficiency anemia, unspecified: Secondary | ICD-10-CM | POA: Diagnosis not present

## 2016-02-12 DIAGNOSIS — I2510 Atherosclerotic heart disease of native coronary artery without angina pectoris: Secondary | ICD-10-CM | POA: Diagnosis not present

## 2016-02-12 DIAGNOSIS — J441 Chronic obstructive pulmonary disease with (acute) exacerbation: Secondary | ICD-10-CM | POA: Diagnosis not present

## 2016-02-12 DIAGNOSIS — Z9981 Dependence on supplemental oxygen: Secondary | ICD-10-CM | POA: Diagnosis not present

## 2016-02-12 DIAGNOSIS — I10 Essential (primary) hypertension: Secondary | ICD-10-CM | POA: Diagnosis not present

## 2016-02-12 DIAGNOSIS — E1165 Type 2 diabetes mellitus with hyperglycemia: Secondary | ICD-10-CM | POA: Diagnosis not present

## 2016-02-13 DIAGNOSIS — J441 Chronic obstructive pulmonary disease with (acute) exacerbation: Secondary | ICD-10-CM | POA: Diagnosis not present

## 2016-02-13 DIAGNOSIS — D509 Iron deficiency anemia, unspecified: Secondary | ICD-10-CM | POA: Diagnosis not present

## 2016-02-13 DIAGNOSIS — Z9981 Dependence on supplemental oxygen: Secondary | ICD-10-CM | POA: Diagnosis not present

## 2016-02-13 DIAGNOSIS — I10 Essential (primary) hypertension: Secondary | ICD-10-CM | POA: Diagnosis not present

## 2016-02-13 DIAGNOSIS — I2510 Atherosclerotic heart disease of native coronary artery without angina pectoris: Secondary | ICD-10-CM | POA: Diagnosis not present

## 2016-02-13 DIAGNOSIS — E1165 Type 2 diabetes mellitus with hyperglycemia: Secondary | ICD-10-CM | POA: Diagnosis not present

## 2016-02-16 DIAGNOSIS — I2510 Atherosclerotic heart disease of native coronary artery without angina pectoris: Secondary | ICD-10-CM | POA: Diagnosis not present

## 2016-02-16 DIAGNOSIS — R338 Other retention of urine: Secondary | ICD-10-CM | POA: Diagnosis not present

## 2016-02-16 DIAGNOSIS — R351 Nocturia: Secondary | ICD-10-CM | POA: Diagnosis not present

## 2016-02-16 DIAGNOSIS — Z9981 Dependence on supplemental oxygen: Secondary | ICD-10-CM | POA: Diagnosis not present

## 2016-02-16 DIAGNOSIS — J441 Chronic obstructive pulmonary disease with (acute) exacerbation: Secondary | ICD-10-CM | POA: Diagnosis not present

## 2016-02-16 DIAGNOSIS — I10 Essential (primary) hypertension: Secondary | ICD-10-CM | POA: Diagnosis not present

## 2016-02-16 DIAGNOSIS — E1165 Type 2 diabetes mellitus with hyperglycemia: Secondary | ICD-10-CM | POA: Diagnosis not present

## 2016-02-16 DIAGNOSIS — D509 Iron deficiency anemia, unspecified: Secondary | ICD-10-CM | POA: Diagnosis not present

## 2016-02-17 DIAGNOSIS — Z9981 Dependence on supplemental oxygen: Secondary | ICD-10-CM | POA: Diagnosis not present

## 2016-02-17 DIAGNOSIS — I10 Essential (primary) hypertension: Secondary | ICD-10-CM | POA: Diagnosis not present

## 2016-02-17 DIAGNOSIS — I2510 Atherosclerotic heart disease of native coronary artery without angina pectoris: Secondary | ICD-10-CM | POA: Diagnosis not present

## 2016-02-17 DIAGNOSIS — D509 Iron deficiency anemia, unspecified: Secondary | ICD-10-CM | POA: Diagnosis not present

## 2016-02-17 DIAGNOSIS — E1165 Type 2 diabetes mellitus with hyperglycemia: Secondary | ICD-10-CM | POA: Diagnosis not present

## 2016-02-17 DIAGNOSIS — J441 Chronic obstructive pulmonary disease with (acute) exacerbation: Secondary | ICD-10-CM | POA: Diagnosis not present

## 2016-02-18 DIAGNOSIS — D509 Iron deficiency anemia, unspecified: Secondary | ICD-10-CM | POA: Diagnosis not present

## 2016-02-18 DIAGNOSIS — J441 Chronic obstructive pulmonary disease with (acute) exacerbation: Secondary | ICD-10-CM | POA: Diagnosis not present

## 2016-02-18 DIAGNOSIS — I10 Essential (primary) hypertension: Secondary | ICD-10-CM | POA: Diagnosis not present

## 2016-02-18 DIAGNOSIS — Z9981 Dependence on supplemental oxygen: Secondary | ICD-10-CM | POA: Diagnosis not present

## 2016-02-18 DIAGNOSIS — E1165 Type 2 diabetes mellitus with hyperglycemia: Secondary | ICD-10-CM | POA: Diagnosis not present

## 2016-02-18 DIAGNOSIS — I2510 Atherosclerotic heart disease of native coronary artery without angina pectoris: Secondary | ICD-10-CM | POA: Diagnosis not present

## 2016-02-19 DIAGNOSIS — N401 Enlarged prostate with lower urinary tract symptoms: Secondary | ICD-10-CM | POA: Diagnosis not present

## 2016-02-19 DIAGNOSIS — J441 Chronic obstructive pulmonary disease with (acute) exacerbation: Secondary | ICD-10-CM | POA: Diagnosis not present

## 2016-02-19 DIAGNOSIS — E1165 Type 2 diabetes mellitus with hyperglycemia: Secondary | ICD-10-CM | POA: Diagnosis not present

## 2016-02-19 DIAGNOSIS — N476 Balanoposthitis: Secondary | ICD-10-CM | POA: Diagnosis not present

## 2016-02-19 DIAGNOSIS — I2510 Atherosclerotic heart disease of native coronary artery without angina pectoris: Secondary | ICD-10-CM | POA: Diagnosis not present

## 2016-02-19 DIAGNOSIS — R39198 Other difficulties with micturition: Secondary | ICD-10-CM | POA: Diagnosis not present

## 2016-02-19 DIAGNOSIS — D509 Iron deficiency anemia, unspecified: Secondary | ICD-10-CM | POA: Diagnosis not present

## 2016-02-19 DIAGNOSIS — R351 Nocturia: Secondary | ICD-10-CM | POA: Diagnosis not present

## 2016-02-19 DIAGNOSIS — Z9981 Dependence on supplemental oxygen: Secondary | ICD-10-CM | POA: Diagnosis not present

## 2016-02-19 DIAGNOSIS — I10 Essential (primary) hypertension: Secondary | ICD-10-CM | POA: Diagnosis not present

## 2016-02-19 DIAGNOSIS — N312 Flaccid neuropathic bladder, not elsewhere classified: Secondary | ICD-10-CM | POA: Diagnosis not present

## 2016-02-20 DIAGNOSIS — D509 Iron deficiency anemia, unspecified: Secondary | ICD-10-CM | POA: Diagnosis not present

## 2016-02-20 DIAGNOSIS — J441 Chronic obstructive pulmonary disease with (acute) exacerbation: Secondary | ICD-10-CM | POA: Diagnosis not present

## 2016-02-20 DIAGNOSIS — E1165 Type 2 diabetes mellitus with hyperglycemia: Secondary | ICD-10-CM | POA: Diagnosis not present

## 2016-02-20 DIAGNOSIS — Z9981 Dependence on supplemental oxygen: Secondary | ICD-10-CM | POA: Diagnosis not present

## 2016-02-20 DIAGNOSIS — I10 Essential (primary) hypertension: Secondary | ICD-10-CM | POA: Diagnosis not present

## 2016-02-20 DIAGNOSIS — I2510 Atherosclerotic heart disease of native coronary artery without angina pectoris: Secondary | ICD-10-CM | POA: Diagnosis not present

## 2016-02-21 DIAGNOSIS — E1165 Type 2 diabetes mellitus with hyperglycemia: Secondary | ICD-10-CM | POA: Diagnosis not present

## 2016-02-21 DIAGNOSIS — I2510 Atherosclerotic heart disease of native coronary artery without angina pectoris: Secondary | ICD-10-CM | POA: Diagnosis not present

## 2016-02-21 DIAGNOSIS — Z9981 Dependence on supplemental oxygen: Secondary | ICD-10-CM | POA: Diagnosis not present

## 2016-02-21 DIAGNOSIS — I10 Essential (primary) hypertension: Secondary | ICD-10-CM | POA: Diagnosis not present

## 2016-02-21 DIAGNOSIS — J441 Chronic obstructive pulmonary disease with (acute) exacerbation: Secondary | ICD-10-CM | POA: Diagnosis not present

## 2016-02-21 DIAGNOSIS — D509 Iron deficiency anemia, unspecified: Secondary | ICD-10-CM | POA: Diagnosis not present

## 2016-02-23 DIAGNOSIS — Z9981 Dependence on supplemental oxygen: Secondary | ICD-10-CM | POA: Diagnosis not present

## 2016-02-23 DIAGNOSIS — I2510 Atherosclerotic heart disease of native coronary artery without angina pectoris: Secondary | ICD-10-CM | POA: Diagnosis not present

## 2016-02-23 DIAGNOSIS — E1165 Type 2 diabetes mellitus with hyperglycemia: Secondary | ICD-10-CM | POA: Diagnosis not present

## 2016-02-23 DIAGNOSIS — I10 Essential (primary) hypertension: Secondary | ICD-10-CM | POA: Diagnosis not present

## 2016-02-23 DIAGNOSIS — J441 Chronic obstructive pulmonary disease with (acute) exacerbation: Secondary | ICD-10-CM | POA: Diagnosis not present

## 2016-02-23 DIAGNOSIS — D509 Iron deficiency anemia, unspecified: Secondary | ICD-10-CM | POA: Diagnosis not present

## 2016-02-24 DIAGNOSIS — J441 Chronic obstructive pulmonary disease with (acute) exacerbation: Secondary | ICD-10-CM | POA: Diagnosis not present

## 2016-02-24 DIAGNOSIS — I2510 Atherosclerotic heart disease of native coronary artery without angina pectoris: Secondary | ICD-10-CM | POA: Diagnosis not present

## 2016-02-24 DIAGNOSIS — E1165 Type 2 diabetes mellitus with hyperglycemia: Secondary | ICD-10-CM | POA: Diagnosis not present

## 2016-02-24 DIAGNOSIS — Z9981 Dependence on supplemental oxygen: Secondary | ICD-10-CM | POA: Diagnosis not present

## 2016-02-24 DIAGNOSIS — D509 Iron deficiency anemia, unspecified: Secondary | ICD-10-CM | POA: Diagnosis not present

## 2016-02-24 DIAGNOSIS — I10 Essential (primary) hypertension: Secondary | ICD-10-CM | POA: Diagnosis not present

## 2016-02-25 DIAGNOSIS — J441 Chronic obstructive pulmonary disease with (acute) exacerbation: Secondary | ICD-10-CM | POA: Diagnosis not present

## 2016-02-25 DIAGNOSIS — D509 Iron deficiency anemia, unspecified: Secondary | ICD-10-CM | POA: Diagnosis not present

## 2016-02-25 DIAGNOSIS — Z9981 Dependence on supplemental oxygen: Secondary | ICD-10-CM | POA: Diagnosis not present

## 2016-02-25 DIAGNOSIS — I2510 Atherosclerotic heart disease of native coronary artery without angina pectoris: Secondary | ICD-10-CM | POA: Diagnosis not present

## 2016-02-25 DIAGNOSIS — E1165 Type 2 diabetes mellitus with hyperglycemia: Secondary | ICD-10-CM | POA: Diagnosis not present

## 2016-02-25 DIAGNOSIS — I10 Essential (primary) hypertension: Secondary | ICD-10-CM | POA: Diagnosis not present

## 2016-02-26 DIAGNOSIS — I10 Essential (primary) hypertension: Secondary | ICD-10-CM | POA: Diagnosis not present

## 2016-02-26 DIAGNOSIS — Z9981 Dependence on supplemental oxygen: Secondary | ICD-10-CM | POA: Diagnosis not present

## 2016-02-26 DIAGNOSIS — I2510 Atherosclerotic heart disease of native coronary artery without angina pectoris: Secondary | ICD-10-CM | POA: Diagnosis not present

## 2016-02-26 DIAGNOSIS — D509 Iron deficiency anemia, unspecified: Secondary | ICD-10-CM | POA: Diagnosis not present

## 2016-02-26 DIAGNOSIS — J441 Chronic obstructive pulmonary disease with (acute) exacerbation: Secondary | ICD-10-CM | POA: Diagnosis not present

## 2016-02-26 DIAGNOSIS — E1165 Type 2 diabetes mellitus with hyperglycemia: Secondary | ICD-10-CM | POA: Diagnosis not present

## 2016-02-27 DIAGNOSIS — J441 Chronic obstructive pulmonary disease with (acute) exacerbation: Secondary | ICD-10-CM | POA: Diagnosis not present

## 2016-02-27 DIAGNOSIS — Z9981 Dependence on supplemental oxygen: Secondary | ICD-10-CM | POA: Diagnosis not present

## 2016-02-27 DIAGNOSIS — D509 Iron deficiency anemia, unspecified: Secondary | ICD-10-CM | POA: Diagnosis not present

## 2016-02-27 DIAGNOSIS — E1165 Type 2 diabetes mellitus with hyperglycemia: Secondary | ICD-10-CM | POA: Diagnosis not present

## 2016-02-27 DIAGNOSIS — I10 Essential (primary) hypertension: Secondary | ICD-10-CM | POA: Diagnosis not present

## 2016-02-27 DIAGNOSIS — I2510 Atherosclerotic heart disease of native coronary artery without angina pectoris: Secondary | ICD-10-CM | POA: Diagnosis not present

## 2016-03-01 DIAGNOSIS — Z9981 Dependence on supplemental oxygen: Secondary | ICD-10-CM | POA: Diagnosis not present

## 2016-03-01 DIAGNOSIS — D509 Iron deficiency anemia, unspecified: Secondary | ICD-10-CM | POA: Diagnosis not present

## 2016-03-01 DIAGNOSIS — I10 Essential (primary) hypertension: Secondary | ICD-10-CM | POA: Diagnosis not present

## 2016-03-01 DIAGNOSIS — J441 Chronic obstructive pulmonary disease with (acute) exacerbation: Secondary | ICD-10-CM | POA: Diagnosis not present

## 2016-03-01 DIAGNOSIS — E1165 Type 2 diabetes mellitus with hyperglycemia: Secondary | ICD-10-CM | POA: Diagnosis not present

## 2016-03-01 DIAGNOSIS — I2510 Atherosclerotic heart disease of native coronary artery without angina pectoris: Secondary | ICD-10-CM | POA: Diagnosis not present

## 2016-03-02 DIAGNOSIS — D509 Iron deficiency anemia, unspecified: Secondary | ICD-10-CM | POA: Diagnosis not present

## 2016-03-02 DIAGNOSIS — E1165 Type 2 diabetes mellitus with hyperglycemia: Secondary | ICD-10-CM | POA: Diagnosis not present

## 2016-03-02 DIAGNOSIS — I2510 Atherosclerotic heart disease of native coronary artery without angina pectoris: Secondary | ICD-10-CM | POA: Diagnosis not present

## 2016-03-02 DIAGNOSIS — J441 Chronic obstructive pulmonary disease with (acute) exacerbation: Secondary | ICD-10-CM | POA: Diagnosis not present

## 2016-03-02 DIAGNOSIS — I10 Essential (primary) hypertension: Secondary | ICD-10-CM | POA: Diagnosis not present

## 2016-03-02 DIAGNOSIS — Z9981 Dependence on supplemental oxygen: Secondary | ICD-10-CM | POA: Diagnosis not present

## 2016-03-03 DIAGNOSIS — E1165 Type 2 diabetes mellitus with hyperglycemia: Secondary | ICD-10-CM | POA: Diagnosis not present

## 2016-03-03 DIAGNOSIS — I2510 Atherosclerotic heart disease of native coronary artery without angina pectoris: Secondary | ICD-10-CM | POA: Diagnosis not present

## 2016-03-03 DIAGNOSIS — D509 Iron deficiency anemia, unspecified: Secondary | ICD-10-CM | POA: Diagnosis not present

## 2016-03-03 DIAGNOSIS — Z9981 Dependence on supplemental oxygen: Secondary | ICD-10-CM | POA: Diagnosis not present

## 2016-03-03 DIAGNOSIS — J441 Chronic obstructive pulmonary disease with (acute) exacerbation: Secondary | ICD-10-CM | POA: Diagnosis not present

## 2016-03-03 DIAGNOSIS — I10 Essential (primary) hypertension: Secondary | ICD-10-CM | POA: Diagnosis not present

## 2016-03-04 DIAGNOSIS — Z466 Encounter for fitting and adjustment of urinary device: Secondary | ICD-10-CM | POA: Diagnosis not present

## 2016-03-04 DIAGNOSIS — I252 Old myocardial infarction: Secondary | ICD-10-CM | POA: Diagnosis not present

## 2016-03-04 DIAGNOSIS — E559 Vitamin D deficiency, unspecified: Secondary | ICD-10-CM | POA: Diagnosis not present

## 2016-03-04 DIAGNOSIS — D509 Iron deficiency anemia, unspecified: Secondary | ICD-10-CM | POA: Diagnosis not present

## 2016-03-04 DIAGNOSIS — N400 Enlarged prostate without lower urinary tract symptoms: Secondary | ICD-10-CM | POA: Diagnosis not present

## 2016-03-04 DIAGNOSIS — Z681 Body mass index (BMI) 19 or less, adult: Secondary | ICD-10-CM | POA: Diagnosis not present

## 2016-03-04 DIAGNOSIS — E782 Mixed hyperlipidemia: Secondary | ICD-10-CM | POA: Diagnosis not present

## 2016-03-04 DIAGNOSIS — Z87440 Personal history of urinary (tract) infections: Secondary | ICD-10-CM | POA: Diagnosis not present

## 2016-03-04 DIAGNOSIS — I2510 Atherosclerotic heart disease of native coronary artery without angina pectoris: Secondary | ICD-10-CM | POA: Diagnosis not present

## 2016-03-04 DIAGNOSIS — H409 Unspecified glaucoma: Secondary | ICD-10-CM | POA: Diagnosis not present

## 2016-03-04 DIAGNOSIS — Z9981 Dependence on supplemental oxygen: Secondary | ICD-10-CM | POA: Diagnosis not present

## 2016-03-04 DIAGNOSIS — Z87891 Personal history of nicotine dependence: Secondary | ICD-10-CM | POA: Diagnosis not present

## 2016-03-04 DIAGNOSIS — J441 Chronic obstructive pulmonary disease with (acute) exacerbation: Secondary | ICD-10-CM | POA: Diagnosis not present

## 2016-03-04 DIAGNOSIS — I10 Essential (primary) hypertension: Secondary | ICD-10-CM | POA: Diagnosis not present

## 2016-03-04 DIAGNOSIS — Z8673 Personal history of transient ischemic attack (TIA), and cerebral infarction without residual deficits: Secondary | ICD-10-CM | POA: Diagnosis not present

## 2016-03-04 DIAGNOSIS — E1165 Type 2 diabetes mellitus with hyperglycemia: Secondary | ICD-10-CM | POA: Diagnosis not present

## 2016-03-11 DIAGNOSIS — R339 Retention of urine, unspecified: Secondary | ICD-10-CM | POA: Diagnosis not present

## 2016-03-11 DIAGNOSIS — R310 Gross hematuria: Secondary | ICD-10-CM | POA: Diagnosis not present

## 2016-03-22 DIAGNOSIS — R339 Retention of urine, unspecified: Secondary | ICD-10-CM | POA: Diagnosis not present

## 2016-03-22 DIAGNOSIS — R310 Gross hematuria: Secondary | ICD-10-CM | POA: Diagnosis not present

## 2016-04-03 DIAGNOSIS — H409 Unspecified glaucoma: Secondary | ICD-10-CM | POA: Diagnosis not present

## 2016-04-03 DIAGNOSIS — I252 Old myocardial infarction: Secondary | ICD-10-CM | POA: Diagnosis not present

## 2016-04-03 DIAGNOSIS — Z8673 Personal history of transient ischemic attack (TIA), and cerebral infarction without residual deficits: Secondary | ICD-10-CM | POA: Diagnosis not present

## 2016-04-03 DIAGNOSIS — Z87440 Personal history of urinary (tract) infections: Secondary | ICD-10-CM | POA: Diagnosis not present

## 2016-04-03 DIAGNOSIS — Z9981 Dependence on supplemental oxygen: Secondary | ICD-10-CM | POA: Diagnosis not present

## 2016-04-03 DIAGNOSIS — Z466 Encounter for fitting and adjustment of urinary device: Secondary | ICD-10-CM | POA: Diagnosis not present

## 2016-04-03 DIAGNOSIS — Z87891 Personal history of nicotine dependence: Secondary | ICD-10-CM | POA: Diagnosis not present

## 2016-04-03 DIAGNOSIS — J441 Chronic obstructive pulmonary disease with (acute) exacerbation: Secondary | ICD-10-CM | POA: Diagnosis not present

## 2016-04-03 DIAGNOSIS — E782 Mixed hyperlipidemia: Secondary | ICD-10-CM | POA: Diagnosis not present

## 2016-04-03 DIAGNOSIS — I2510 Atherosclerotic heart disease of native coronary artery without angina pectoris: Secondary | ICD-10-CM | POA: Diagnosis not present

## 2016-04-03 DIAGNOSIS — I10 Essential (primary) hypertension: Secondary | ICD-10-CM | POA: Diagnosis not present

## 2016-04-03 DIAGNOSIS — N400 Enlarged prostate without lower urinary tract symptoms: Secondary | ICD-10-CM | POA: Diagnosis not present

## 2016-04-03 DIAGNOSIS — Z681 Body mass index (BMI) 19 or less, adult: Secondary | ICD-10-CM | POA: Diagnosis not present

## 2016-04-03 DIAGNOSIS — E1165 Type 2 diabetes mellitus with hyperglycemia: Secondary | ICD-10-CM | POA: Diagnosis not present

## 2016-04-03 DIAGNOSIS — D509 Iron deficiency anemia, unspecified: Secondary | ICD-10-CM | POA: Diagnosis not present

## 2016-04-03 DIAGNOSIS — E559 Vitamin D deficiency, unspecified: Secondary | ICD-10-CM | POA: Diagnosis not present

## 2016-05-04 DIAGNOSIS — Z9981 Dependence on supplemental oxygen: Secondary | ICD-10-CM | POA: Diagnosis not present

## 2016-05-04 DIAGNOSIS — J441 Chronic obstructive pulmonary disease with (acute) exacerbation: Secondary | ICD-10-CM | POA: Diagnosis not present

## 2016-05-04 DIAGNOSIS — E782 Mixed hyperlipidemia: Secondary | ICD-10-CM | POA: Diagnosis not present

## 2016-05-04 DIAGNOSIS — N312 Flaccid neuropathic bladder, not elsewhere classified: Secondary | ICD-10-CM | POA: Diagnosis not present

## 2016-05-04 DIAGNOSIS — Z466 Encounter for fitting and adjustment of urinary device: Secondary | ICD-10-CM | POA: Diagnosis not present

## 2016-05-04 DIAGNOSIS — N4889 Other specified disorders of penis: Secondary | ICD-10-CM | POA: Diagnosis not present

## 2016-05-04 DIAGNOSIS — R339 Retention of urine, unspecified: Secondary | ICD-10-CM | POA: Diagnosis not present

## 2016-05-04 DIAGNOSIS — N400 Enlarged prostate without lower urinary tract symptoms: Secondary | ICD-10-CM | POA: Diagnosis not present

## 2016-05-04 DIAGNOSIS — Z87891 Personal history of nicotine dependence: Secondary | ICD-10-CM | POA: Diagnosis not present

## 2016-05-04 DIAGNOSIS — I2510 Atherosclerotic heart disease of native coronary artery without angina pectoris: Secondary | ICD-10-CM | POA: Diagnosis not present

## 2016-05-04 DIAGNOSIS — E1165 Type 2 diabetes mellitus with hyperglycemia: Secondary | ICD-10-CM | POA: Diagnosis not present

## 2016-05-04 DIAGNOSIS — B9562 Methicillin resistant Staphylococcus aureus infection as the cause of diseases classified elsewhere: Secondary | ICD-10-CM | POA: Diagnosis not present

## 2016-05-04 DIAGNOSIS — D509 Iron deficiency anemia, unspecified: Secondary | ICD-10-CM | POA: Diagnosis not present

## 2016-05-04 DIAGNOSIS — I10 Essential (primary) hypertension: Secondary | ICD-10-CM | POA: Diagnosis not present

## 2016-05-04 DIAGNOSIS — H409 Unspecified glaucoma: Secondary | ICD-10-CM | POA: Diagnosis not present

## 2016-05-04 DIAGNOSIS — I252 Old myocardial infarction: Secondary | ICD-10-CM | POA: Diagnosis not present

## 2016-05-04 DIAGNOSIS — N390 Urinary tract infection, site not specified: Secondary | ICD-10-CM | POA: Diagnosis not present

## 2016-05-04 DIAGNOSIS — Z8673 Personal history of transient ischemic attack (TIA), and cerebral infarction without residual deficits: Secondary | ICD-10-CM | POA: Diagnosis not present

## 2016-05-04 DIAGNOSIS — E559 Vitamin D deficiency, unspecified: Secondary | ICD-10-CM | POA: Diagnosis not present

## 2016-06-04 DIAGNOSIS — E782 Mixed hyperlipidemia: Secondary | ICD-10-CM | POA: Diagnosis not present

## 2016-06-04 DIAGNOSIS — Z9981 Dependence on supplemental oxygen: Secondary | ICD-10-CM | POA: Diagnosis not present

## 2016-06-04 DIAGNOSIS — N390 Urinary tract infection, site not specified: Secondary | ICD-10-CM | POA: Diagnosis not present

## 2016-06-04 DIAGNOSIS — N400 Enlarged prostate without lower urinary tract symptoms: Secondary | ICD-10-CM | POA: Diagnosis not present

## 2016-06-04 DIAGNOSIS — E559 Vitamin D deficiency, unspecified: Secondary | ICD-10-CM | POA: Diagnosis not present

## 2016-06-04 DIAGNOSIS — Z8673 Personal history of transient ischemic attack (TIA), and cerebral infarction without residual deficits: Secondary | ICD-10-CM | POA: Diagnosis not present

## 2016-06-04 DIAGNOSIS — D509 Iron deficiency anemia, unspecified: Secondary | ICD-10-CM | POA: Diagnosis not present

## 2016-06-04 DIAGNOSIS — R339 Retention of urine, unspecified: Secondary | ICD-10-CM | POA: Diagnosis not present

## 2016-06-04 DIAGNOSIS — E1165 Type 2 diabetes mellitus with hyperglycemia: Secondary | ICD-10-CM | POA: Diagnosis not present

## 2016-06-04 DIAGNOSIS — I10 Essential (primary) hypertension: Secondary | ICD-10-CM | POA: Diagnosis not present

## 2016-06-04 DIAGNOSIS — Z87891 Personal history of nicotine dependence: Secondary | ICD-10-CM | POA: Diagnosis not present

## 2016-06-04 DIAGNOSIS — J441 Chronic obstructive pulmonary disease with (acute) exacerbation: Secondary | ICD-10-CM | POA: Diagnosis not present

## 2016-06-04 DIAGNOSIS — Z466 Encounter for fitting and adjustment of urinary device: Secondary | ICD-10-CM | POA: Diagnosis not present

## 2016-06-04 DIAGNOSIS — B9562 Methicillin resistant Staphylococcus aureus infection as the cause of diseases classified elsewhere: Secondary | ICD-10-CM | POA: Diagnosis not present

## 2016-06-04 DIAGNOSIS — I252 Old myocardial infarction: Secondary | ICD-10-CM | POA: Diagnosis not present

## 2016-06-04 DIAGNOSIS — H409 Unspecified glaucoma: Secondary | ICD-10-CM | POA: Diagnosis not present

## 2016-06-04 DIAGNOSIS — I2510 Atherosclerotic heart disease of native coronary artery without angina pectoris: Secondary | ICD-10-CM | POA: Diagnosis not present

## 2016-06-10 DIAGNOSIS — N481 Balanitis: Secondary | ICD-10-CM | POA: Diagnosis not present

## 2016-06-10 DIAGNOSIS — R339 Retention of urine, unspecified: Secondary | ICD-10-CM | POA: Diagnosis not present

## 2016-06-10 DIAGNOSIS — R300 Dysuria: Secondary | ICD-10-CM | POA: Diagnosis not present

## 2016-07-04 DIAGNOSIS — R339 Retention of urine, unspecified: Secondary | ICD-10-CM | POA: Diagnosis not present

## 2016-07-04 DIAGNOSIS — Z8673 Personal history of transient ischemic attack (TIA), and cerebral infarction without residual deficits: Secondary | ICD-10-CM | POA: Diagnosis not present

## 2016-07-04 DIAGNOSIS — B9562 Methicillin resistant Staphylococcus aureus infection as the cause of diseases classified elsewhere: Secondary | ICD-10-CM | POA: Diagnosis not present

## 2016-07-04 DIAGNOSIS — I10 Essential (primary) hypertension: Secondary | ICD-10-CM | POA: Diagnosis not present

## 2016-07-04 DIAGNOSIS — Z466 Encounter for fitting and adjustment of urinary device: Secondary | ICD-10-CM | POA: Diagnosis not present

## 2016-07-04 DIAGNOSIS — Z9981 Dependence on supplemental oxygen: Secondary | ICD-10-CM | POA: Diagnosis not present

## 2016-07-04 DIAGNOSIS — N400 Enlarged prostate without lower urinary tract symptoms: Secondary | ICD-10-CM | POA: Diagnosis not present

## 2016-07-04 DIAGNOSIS — E782 Mixed hyperlipidemia: Secondary | ICD-10-CM | POA: Diagnosis not present

## 2016-07-04 DIAGNOSIS — E1165 Type 2 diabetes mellitus with hyperglycemia: Secondary | ICD-10-CM | POA: Diagnosis not present

## 2016-07-04 DIAGNOSIS — Z87891 Personal history of nicotine dependence: Secondary | ICD-10-CM | POA: Diagnosis not present

## 2016-07-04 DIAGNOSIS — E559 Vitamin D deficiency, unspecified: Secondary | ICD-10-CM | POA: Diagnosis not present

## 2016-07-04 DIAGNOSIS — I252 Old myocardial infarction: Secondary | ICD-10-CM | POA: Diagnosis not present

## 2016-07-04 DIAGNOSIS — J441 Chronic obstructive pulmonary disease with (acute) exacerbation: Secondary | ICD-10-CM | POA: Diagnosis not present

## 2016-07-04 DIAGNOSIS — D509 Iron deficiency anemia, unspecified: Secondary | ICD-10-CM | POA: Diagnosis not present

## 2016-07-04 DIAGNOSIS — I2510 Atherosclerotic heart disease of native coronary artery without angina pectoris: Secondary | ICD-10-CM | POA: Diagnosis not present

## 2016-07-04 DIAGNOSIS — N390 Urinary tract infection, site not specified: Secondary | ICD-10-CM | POA: Diagnosis not present

## 2016-07-04 DIAGNOSIS — H409 Unspecified glaucoma: Secondary | ICD-10-CM | POA: Diagnosis not present

## 2016-07-17 DIAGNOSIS — Z23 Encounter for immunization: Secondary | ICD-10-CM | POA: Diagnosis not present

## 2016-08-04 DIAGNOSIS — R339 Retention of urine, unspecified: Secondary | ICD-10-CM | POA: Diagnosis not present

## 2016-08-04 DIAGNOSIS — I252 Old myocardial infarction: Secondary | ICD-10-CM | POA: Diagnosis not present

## 2016-08-04 DIAGNOSIS — H409 Unspecified glaucoma: Secondary | ICD-10-CM | POA: Diagnosis not present

## 2016-08-04 DIAGNOSIS — B9562 Methicillin resistant Staphylococcus aureus infection as the cause of diseases classified elsewhere: Secondary | ICD-10-CM | POA: Diagnosis not present

## 2016-08-04 DIAGNOSIS — Z87891 Personal history of nicotine dependence: Secondary | ICD-10-CM | POA: Diagnosis not present

## 2016-08-04 DIAGNOSIS — Z466 Encounter for fitting and adjustment of urinary device: Secondary | ICD-10-CM | POA: Diagnosis not present

## 2016-08-04 DIAGNOSIS — E1165 Type 2 diabetes mellitus with hyperglycemia: Secondary | ICD-10-CM | POA: Diagnosis not present

## 2016-08-04 DIAGNOSIS — N390 Urinary tract infection, site not specified: Secondary | ICD-10-CM | POA: Diagnosis not present

## 2016-08-04 DIAGNOSIS — J441 Chronic obstructive pulmonary disease with (acute) exacerbation: Secondary | ICD-10-CM | POA: Diagnosis not present

## 2016-08-04 DIAGNOSIS — E782 Mixed hyperlipidemia: Secondary | ICD-10-CM | POA: Diagnosis not present

## 2016-08-04 DIAGNOSIS — I2510 Atherosclerotic heart disease of native coronary artery without angina pectoris: Secondary | ICD-10-CM | POA: Diagnosis not present

## 2016-08-04 DIAGNOSIS — Z9981 Dependence on supplemental oxygen: Secondary | ICD-10-CM | POA: Diagnosis not present

## 2016-08-04 DIAGNOSIS — E559 Vitamin D deficiency, unspecified: Secondary | ICD-10-CM | POA: Diagnosis not present

## 2016-08-04 DIAGNOSIS — Z8673 Personal history of transient ischemic attack (TIA), and cerebral infarction without residual deficits: Secondary | ICD-10-CM | POA: Diagnosis not present

## 2016-08-04 DIAGNOSIS — I10 Essential (primary) hypertension: Secondary | ICD-10-CM | POA: Diagnosis not present

## 2016-08-04 DIAGNOSIS — D509 Iron deficiency anemia, unspecified: Secondary | ICD-10-CM | POA: Diagnosis not present

## 2016-08-04 DIAGNOSIS — N400 Enlarged prostate without lower urinary tract symptoms: Secondary | ICD-10-CM | POA: Diagnosis not present

## 2016-09-03 DIAGNOSIS — I252 Old myocardial infarction: Secondary | ICD-10-CM | POA: Diagnosis not present

## 2016-09-03 DIAGNOSIS — H409 Unspecified glaucoma: Secondary | ICD-10-CM | POA: Diagnosis not present

## 2016-09-03 DIAGNOSIS — N390 Urinary tract infection, site not specified: Secondary | ICD-10-CM | POA: Diagnosis not present

## 2016-09-03 DIAGNOSIS — D509 Iron deficiency anemia, unspecified: Secondary | ICD-10-CM | POA: Diagnosis not present

## 2016-09-03 DIAGNOSIS — B9562 Methicillin resistant Staphylococcus aureus infection as the cause of diseases classified elsewhere: Secondary | ICD-10-CM | POA: Diagnosis not present

## 2016-09-03 DIAGNOSIS — N400 Enlarged prostate without lower urinary tract symptoms: Secondary | ICD-10-CM | POA: Diagnosis not present

## 2016-09-03 DIAGNOSIS — I2510 Atherosclerotic heart disease of native coronary artery without angina pectoris: Secondary | ICD-10-CM | POA: Diagnosis not present

## 2016-09-03 DIAGNOSIS — E559 Vitamin D deficiency, unspecified: Secondary | ICD-10-CM | POA: Diagnosis not present

## 2016-09-03 DIAGNOSIS — Z87891 Personal history of nicotine dependence: Secondary | ICD-10-CM | POA: Diagnosis not present

## 2016-09-03 DIAGNOSIS — J441 Chronic obstructive pulmonary disease with (acute) exacerbation: Secondary | ICD-10-CM | POA: Diagnosis not present

## 2016-09-03 DIAGNOSIS — Z8673 Personal history of transient ischemic attack (TIA), and cerebral infarction without residual deficits: Secondary | ICD-10-CM | POA: Diagnosis not present

## 2016-09-03 DIAGNOSIS — I10 Essential (primary) hypertension: Secondary | ICD-10-CM | POA: Diagnosis not present

## 2016-09-03 DIAGNOSIS — E1165 Type 2 diabetes mellitus with hyperglycemia: Secondary | ICD-10-CM | POA: Diagnosis not present

## 2016-09-03 DIAGNOSIS — E782 Mixed hyperlipidemia: Secondary | ICD-10-CM | POA: Diagnosis not present

## 2016-09-03 DIAGNOSIS — R339 Retention of urine, unspecified: Secondary | ICD-10-CM | POA: Diagnosis not present

## 2016-09-03 DIAGNOSIS — Z9981 Dependence on supplemental oxygen: Secondary | ICD-10-CM | POA: Diagnosis not present

## 2016-09-03 DIAGNOSIS — Z466 Encounter for fitting and adjustment of urinary device: Secondary | ICD-10-CM | POA: Diagnosis not present

## 2016-10-04 DIAGNOSIS — R339 Retention of urine, unspecified: Secondary | ICD-10-CM | POA: Diagnosis not present

## 2016-10-04 DIAGNOSIS — E782 Mixed hyperlipidemia: Secondary | ICD-10-CM | POA: Diagnosis not present

## 2016-10-04 DIAGNOSIS — J441 Chronic obstructive pulmonary disease with (acute) exacerbation: Secondary | ICD-10-CM | POA: Diagnosis not present

## 2016-10-04 DIAGNOSIS — I10 Essential (primary) hypertension: Secondary | ICD-10-CM | POA: Diagnosis not present

## 2016-10-04 DIAGNOSIS — Z87891 Personal history of nicotine dependence: Secondary | ICD-10-CM | POA: Diagnosis not present

## 2016-10-04 DIAGNOSIS — Z466 Encounter for fitting and adjustment of urinary device: Secondary | ICD-10-CM | POA: Diagnosis not present

## 2016-10-04 DIAGNOSIS — Z8673 Personal history of transient ischemic attack (TIA), and cerebral infarction without residual deficits: Secondary | ICD-10-CM | POA: Diagnosis not present

## 2016-10-04 DIAGNOSIS — I2510 Atherosclerotic heart disease of native coronary artery without angina pectoris: Secondary | ICD-10-CM | POA: Diagnosis not present

## 2016-10-04 DIAGNOSIS — E559 Vitamin D deficiency, unspecified: Secondary | ICD-10-CM | POA: Diagnosis not present

## 2016-10-04 DIAGNOSIS — I252 Old myocardial infarction: Secondary | ICD-10-CM | POA: Diagnosis not present

## 2016-10-04 DIAGNOSIS — B9562 Methicillin resistant Staphylococcus aureus infection as the cause of diseases classified elsewhere: Secondary | ICD-10-CM | POA: Diagnosis not present

## 2016-10-04 DIAGNOSIS — Z9981 Dependence on supplemental oxygen: Secondary | ICD-10-CM | POA: Diagnosis not present

## 2016-10-04 DIAGNOSIS — E1165 Type 2 diabetes mellitus with hyperglycemia: Secondary | ICD-10-CM | POA: Diagnosis not present

## 2016-10-04 DIAGNOSIS — N390 Urinary tract infection, site not specified: Secondary | ICD-10-CM | POA: Diagnosis not present

## 2016-10-04 DIAGNOSIS — D509 Iron deficiency anemia, unspecified: Secondary | ICD-10-CM | POA: Diagnosis not present

## 2016-10-04 DIAGNOSIS — N400 Enlarged prostate without lower urinary tract symptoms: Secondary | ICD-10-CM | POA: Diagnosis not present

## 2016-10-04 DIAGNOSIS — H409 Unspecified glaucoma: Secondary | ICD-10-CM | POA: Diagnosis not present

## 2016-10-05 DIAGNOSIS — B9562 Methicillin resistant Staphylococcus aureus infection as the cause of diseases classified elsewhere: Secondary | ICD-10-CM | POA: Diagnosis not present

## 2016-10-05 DIAGNOSIS — I2510 Atherosclerotic heart disease of native coronary artery without angina pectoris: Secondary | ICD-10-CM | POA: Diagnosis not present

## 2016-10-05 DIAGNOSIS — E1165 Type 2 diabetes mellitus with hyperglycemia: Secondary | ICD-10-CM | POA: Diagnosis not present

## 2016-10-05 DIAGNOSIS — J441 Chronic obstructive pulmonary disease with (acute) exacerbation: Secondary | ICD-10-CM | POA: Diagnosis not present

## 2016-10-05 DIAGNOSIS — R339 Retention of urine, unspecified: Secondary | ICD-10-CM | POA: Diagnosis not present

## 2016-10-05 DIAGNOSIS — N390 Urinary tract infection, site not specified: Secondary | ICD-10-CM | POA: Diagnosis not present

## 2016-10-06 DIAGNOSIS — I2510 Atherosclerotic heart disease of native coronary artery without angina pectoris: Secondary | ICD-10-CM | POA: Diagnosis not present

## 2016-10-06 DIAGNOSIS — N390 Urinary tract infection, site not specified: Secondary | ICD-10-CM | POA: Diagnosis not present

## 2016-10-06 DIAGNOSIS — J441 Chronic obstructive pulmonary disease with (acute) exacerbation: Secondary | ICD-10-CM | POA: Diagnosis not present

## 2016-10-06 DIAGNOSIS — E1165 Type 2 diabetes mellitus with hyperglycemia: Secondary | ICD-10-CM | POA: Diagnosis not present

## 2016-10-06 DIAGNOSIS — B9562 Methicillin resistant Staphylococcus aureus infection as the cause of diseases classified elsewhere: Secondary | ICD-10-CM | POA: Diagnosis not present

## 2016-10-06 DIAGNOSIS — R339 Retention of urine, unspecified: Secondary | ICD-10-CM | POA: Diagnosis not present

## 2016-10-07 DIAGNOSIS — I2510 Atherosclerotic heart disease of native coronary artery without angina pectoris: Secondary | ICD-10-CM | POA: Diagnosis not present

## 2016-10-07 DIAGNOSIS — N390 Urinary tract infection, site not specified: Secondary | ICD-10-CM | POA: Diagnosis not present

## 2016-10-07 DIAGNOSIS — R339 Retention of urine, unspecified: Secondary | ICD-10-CM | POA: Diagnosis not present

## 2016-10-07 DIAGNOSIS — E1165 Type 2 diabetes mellitus with hyperglycemia: Secondary | ICD-10-CM | POA: Diagnosis not present

## 2016-10-07 DIAGNOSIS — B9562 Methicillin resistant Staphylococcus aureus infection as the cause of diseases classified elsewhere: Secondary | ICD-10-CM | POA: Diagnosis not present

## 2016-10-07 DIAGNOSIS — J441 Chronic obstructive pulmonary disease with (acute) exacerbation: Secondary | ICD-10-CM | POA: Diagnosis not present

## 2016-10-08 DIAGNOSIS — R339 Retention of urine, unspecified: Secondary | ICD-10-CM | POA: Diagnosis not present

## 2016-10-08 DIAGNOSIS — I2510 Atherosclerotic heart disease of native coronary artery without angina pectoris: Secondary | ICD-10-CM | POA: Diagnosis not present

## 2016-10-08 DIAGNOSIS — N390 Urinary tract infection, site not specified: Secondary | ICD-10-CM | POA: Diagnosis not present

## 2016-10-08 DIAGNOSIS — B9562 Methicillin resistant Staphylococcus aureus infection as the cause of diseases classified elsewhere: Secondary | ICD-10-CM | POA: Diagnosis not present

## 2016-10-08 DIAGNOSIS — J441 Chronic obstructive pulmonary disease with (acute) exacerbation: Secondary | ICD-10-CM | POA: Diagnosis not present

## 2016-10-08 DIAGNOSIS — E1165 Type 2 diabetes mellitus with hyperglycemia: Secondary | ICD-10-CM | POA: Diagnosis not present

## 2016-10-09 DIAGNOSIS — B9562 Methicillin resistant Staphylococcus aureus infection as the cause of diseases classified elsewhere: Secondary | ICD-10-CM | POA: Diagnosis not present

## 2016-10-09 DIAGNOSIS — J441 Chronic obstructive pulmonary disease with (acute) exacerbation: Secondary | ICD-10-CM | POA: Diagnosis not present

## 2016-10-09 DIAGNOSIS — R339 Retention of urine, unspecified: Secondary | ICD-10-CM | POA: Diagnosis not present

## 2016-10-09 DIAGNOSIS — N390 Urinary tract infection, site not specified: Secondary | ICD-10-CM | POA: Diagnosis not present

## 2016-10-09 DIAGNOSIS — I2510 Atherosclerotic heart disease of native coronary artery without angina pectoris: Secondary | ICD-10-CM | POA: Diagnosis not present

## 2016-10-09 DIAGNOSIS — E1165 Type 2 diabetes mellitus with hyperglycemia: Secondary | ICD-10-CM | POA: Diagnosis not present

## 2016-10-10 DIAGNOSIS — B9562 Methicillin resistant Staphylococcus aureus infection as the cause of diseases classified elsewhere: Secondary | ICD-10-CM | POA: Diagnosis not present

## 2016-10-10 DIAGNOSIS — I2510 Atherosclerotic heart disease of native coronary artery without angina pectoris: Secondary | ICD-10-CM | POA: Diagnosis not present

## 2016-10-10 DIAGNOSIS — E1165 Type 2 diabetes mellitus with hyperglycemia: Secondary | ICD-10-CM | POA: Diagnosis not present

## 2016-10-10 DIAGNOSIS — J441 Chronic obstructive pulmonary disease with (acute) exacerbation: Secondary | ICD-10-CM | POA: Diagnosis not present

## 2016-10-10 DIAGNOSIS — N390 Urinary tract infection, site not specified: Secondary | ICD-10-CM | POA: Diagnosis not present

## 2016-10-10 DIAGNOSIS — R339 Retention of urine, unspecified: Secondary | ICD-10-CM | POA: Diagnosis not present

## 2016-10-11 DIAGNOSIS — B9562 Methicillin resistant Staphylococcus aureus infection as the cause of diseases classified elsewhere: Secondary | ICD-10-CM | POA: Diagnosis not present

## 2016-10-11 DIAGNOSIS — J441 Chronic obstructive pulmonary disease with (acute) exacerbation: Secondary | ICD-10-CM | POA: Diagnosis not present

## 2016-10-11 DIAGNOSIS — I2510 Atherosclerotic heart disease of native coronary artery without angina pectoris: Secondary | ICD-10-CM | POA: Diagnosis not present

## 2016-10-11 DIAGNOSIS — E1165 Type 2 diabetes mellitus with hyperglycemia: Secondary | ICD-10-CM | POA: Diagnosis not present

## 2016-10-11 DIAGNOSIS — R339 Retention of urine, unspecified: Secondary | ICD-10-CM | POA: Diagnosis not present

## 2016-10-11 DIAGNOSIS — N390 Urinary tract infection, site not specified: Secondary | ICD-10-CM | POA: Diagnosis not present

## 2016-10-12 DIAGNOSIS — E1165 Type 2 diabetes mellitus with hyperglycemia: Secondary | ICD-10-CM | POA: Diagnosis not present

## 2016-10-12 DIAGNOSIS — I2510 Atherosclerotic heart disease of native coronary artery without angina pectoris: Secondary | ICD-10-CM | POA: Diagnosis not present

## 2016-10-12 DIAGNOSIS — J441 Chronic obstructive pulmonary disease with (acute) exacerbation: Secondary | ICD-10-CM | POA: Diagnosis not present

## 2016-10-12 DIAGNOSIS — R339 Retention of urine, unspecified: Secondary | ICD-10-CM | POA: Diagnosis not present

## 2016-10-12 DIAGNOSIS — B9562 Methicillin resistant Staphylococcus aureus infection as the cause of diseases classified elsewhere: Secondary | ICD-10-CM | POA: Diagnosis not present

## 2016-10-12 DIAGNOSIS — N390 Urinary tract infection, site not specified: Secondary | ICD-10-CM | POA: Diagnosis not present

## 2016-10-13 DIAGNOSIS — E1165 Type 2 diabetes mellitus with hyperglycemia: Secondary | ICD-10-CM | POA: Diagnosis not present

## 2016-10-13 DIAGNOSIS — I2510 Atherosclerotic heart disease of native coronary artery without angina pectoris: Secondary | ICD-10-CM | POA: Diagnosis not present

## 2016-10-13 DIAGNOSIS — N390 Urinary tract infection, site not specified: Secondary | ICD-10-CM | POA: Diagnosis not present

## 2016-10-13 DIAGNOSIS — R339 Retention of urine, unspecified: Secondary | ICD-10-CM | POA: Diagnosis not present

## 2016-10-13 DIAGNOSIS — B9562 Methicillin resistant Staphylococcus aureus infection as the cause of diseases classified elsewhere: Secondary | ICD-10-CM | POA: Diagnosis not present

## 2016-10-13 DIAGNOSIS — J441 Chronic obstructive pulmonary disease with (acute) exacerbation: Secondary | ICD-10-CM | POA: Diagnosis not present

## 2016-10-14 DIAGNOSIS — R339 Retention of urine, unspecified: Secondary | ICD-10-CM | POA: Diagnosis not present

## 2016-10-14 DIAGNOSIS — E1165 Type 2 diabetes mellitus with hyperglycemia: Secondary | ICD-10-CM | POA: Diagnosis not present

## 2016-10-14 DIAGNOSIS — N390 Urinary tract infection, site not specified: Secondary | ICD-10-CM | POA: Diagnosis not present

## 2016-10-14 DIAGNOSIS — B9562 Methicillin resistant Staphylococcus aureus infection as the cause of diseases classified elsewhere: Secondary | ICD-10-CM | POA: Diagnosis not present

## 2016-10-14 DIAGNOSIS — J441 Chronic obstructive pulmonary disease with (acute) exacerbation: Secondary | ICD-10-CM | POA: Diagnosis not present

## 2016-10-14 DIAGNOSIS — I2510 Atherosclerotic heart disease of native coronary artery without angina pectoris: Secondary | ICD-10-CM | POA: Diagnosis not present

## 2016-10-15 DIAGNOSIS — J441 Chronic obstructive pulmonary disease with (acute) exacerbation: Secondary | ICD-10-CM | POA: Diagnosis not present

## 2016-10-15 DIAGNOSIS — E1165 Type 2 diabetes mellitus with hyperglycemia: Secondary | ICD-10-CM | POA: Diagnosis not present

## 2016-10-15 DIAGNOSIS — N390 Urinary tract infection, site not specified: Secondary | ICD-10-CM | POA: Diagnosis not present

## 2016-10-15 DIAGNOSIS — I2510 Atherosclerotic heart disease of native coronary artery without angina pectoris: Secondary | ICD-10-CM | POA: Diagnosis not present

## 2016-10-15 DIAGNOSIS — R339 Retention of urine, unspecified: Secondary | ICD-10-CM | POA: Diagnosis not present

## 2016-10-15 DIAGNOSIS — B9562 Methicillin resistant Staphylococcus aureus infection as the cause of diseases classified elsewhere: Secondary | ICD-10-CM | POA: Diagnosis not present

## 2016-10-16 DIAGNOSIS — J441 Chronic obstructive pulmonary disease with (acute) exacerbation: Secondary | ICD-10-CM | POA: Diagnosis not present

## 2016-10-16 DIAGNOSIS — I2510 Atherosclerotic heart disease of native coronary artery without angina pectoris: Secondary | ICD-10-CM | POA: Diagnosis not present

## 2016-10-16 DIAGNOSIS — B9562 Methicillin resistant Staphylococcus aureus infection as the cause of diseases classified elsewhere: Secondary | ICD-10-CM | POA: Diagnosis not present

## 2016-10-16 DIAGNOSIS — N390 Urinary tract infection, site not specified: Secondary | ICD-10-CM | POA: Diagnosis not present

## 2016-10-16 DIAGNOSIS — E1165 Type 2 diabetes mellitus with hyperglycemia: Secondary | ICD-10-CM | POA: Diagnosis not present

## 2016-10-16 DIAGNOSIS — R339 Retention of urine, unspecified: Secondary | ICD-10-CM | POA: Diagnosis not present

## 2016-10-17 DIAGNOSIS — J441 Chronic obstructive pulmonary disease with (acute) exacerbation: Secondary | ICD-10-CM | POA: Diagnosis not present

## 2016-10-17 DIAGNOSIS — N390 Urinary tract infection, site not specified: Secondary | ICD-10-CM | POA: Diagnosis not present

## 2016-10-17 DIAGNOSIS — E1165 Type 2 diabetes mellitus with hyperglycemia: Secondary | ICD-10-CM | POA: Diagnosis not present

## 2016-10-17 DIAGNOSIS — I2510 Atherosclerotic heart disease of native coronary artery without angina pectoris: Secondary | ICD-10-CM | POA: Diagnosis not present

## 2016-10-17 DIAGNOSIS — R339 Retention of urine, unspecified: Secondary | ICD-10-CM | POA: Diagnosis not present

## 2016-10-17 DIAGNOSIS — B9562 Methicillin resistant Staphylococcus aureus infection as the cause of diseases classified elsewhere: Secondary | ICD-10-CM | POA: Diagnosis not present

## 2016-10-18 DIAGNOSIS — I2510 Atherosclerotic heart disease of native coronary artery without angina pectoris: Secondary | ICD-10-CM | POA: Diagnosis not present

## 2016-10-18 DIAGNOSIS — J441 Chronic obstructive pulmonary disease with (acute) exacerbation: Secondary | ICD-10-CM | POA: Diagnosis not present

## 2016-10-18 DIAGNOSIS — N390 Urinary tract infection, site not specified: Secondary | ICD-10-CM | POA: Diagnosis not present

## 2016-10-18 DIAGNOSIS — E1165 Type 2 diabetes mellitus with hyperglycemia: Secondary | ICD-10-CM | POA: Diagnosis not present

## 2016-10-18 DIAGNOSIS — B9562 Methicillin resistant Staphylococcus aureus infection as the cause of diseases classified elsewhere: Secondary | ICD-10-CM | POA: Diagnosis not present

## 2016-10-18 DIAGNOSIS — R339 Retention of urine, unspecified: Secondary | ICD-10-CM | POA: Diagnosis not present

## 2016-10-19 DIAGNOSIS — E1165 Type 2 diabetes mellitus with hyperglycemia: Secondary | ICD-10-CM | POA: Diagnosis not present

## 2016-10-19 DIAGNOSIS — R339 Retention of urine, unspecified: Secondary | ICD-10-CM | POA: Diagnosis not present

## 2016-10-19 DIAGNOSIS — N390 Urinary tract infection, site not specified: Secondary | ICD-10-CM | POA: Diagnosis not present

## 2016-10-19 DIAGNOSIS — B9562 Methicillin resistant Staphylococcus aureus infection as the cause of diseases classified elsewhere: Secondary | ICD-10-CM | POA: Diagnosis not present

## 2016-10-19 DIAGNOSIS — I2510 Atherosclerotic heart disease of native coronary artery without angina pectoris: Secondary | ICD-10-CM | POA: Diagnosis not present

## 2016-10-19 DIAGNOSIS — J441 Chronic obstructive pulmonary disease with (acute) exacerbation: Secondary | ICD-10-CM | POA: Diagnosis not present

## 2016-10-20 DIAGNOSIS — R339 Retention of urine, unspecified: Secondary | ICD-10-CM | POA: Diagnosis not present

## 2016-10-20 DIAGNOSIS — I2510 Atherosclerotic heart disease of native coronary artery without angina pectoris: Secondary | ICD-10-CM | POA: Diagnosis not present

## 2016-10-20 DIAGNOSIS — J441 Chronic obstructive pulmonary disease with (acute) exacerbation: Secondary | ICD-10-CM | POA: Diagnosis not present

## 2016-10-20 DIAGNOSIS — N390 Urinary tract infection, site not specified: Secondary | ICD-10-CM | POA: Diagnosis not present

## 2016-10-20 DIAGNOSIS — E1165 Type 2 diabetes mellitus with hyperglycemia: Secondary | ICD-10-CM | POA: Diagnosis not present

## 2016-10-20 DIAGNOSIS — B9562 Methicillin resistant Staphylococcus aureus infection as the cause of diseases classified elsewhere: Secondary | ICD-10-CM | POA: Diagnosis not present

## 2016-10-21 DIAGNOSIS — E1165 Type 2 diabetes mellitus with hyperglycemia: Secondary | ICD-10-CM | POA: Diagnosis not present

## 2016-10-21 DIAGNOSIS — J441 Chronic obstructive pulmonary disease with (acute) exacerbation: Secondary | ICD-10-CM | POA: Diagnosis not present

## 2016-10-21 DIAGNOSIS — R339 Retention of urine, unspecified: Secondary | ICD-10-CM | POA: Diagnosis not present

## 2016-10-21 DIAGNOSIS — N390 Urinary tract infection, site not specified: Secondary | ICD-10-CM | POA: Diagnosis not present

## 2016-10-21 DIAGNOSIS — I2510 Atherosclerotic heart disease of native coronary artery without angina pectoris: Secondary | ICD-10-CM | POA: Diagnosis not present

## 2016-10-21 DIAGNOSIS — B9562 Methicillin resistant Staphylococcus aureus infection as the cause of diseases classified elsewhere: Secondary | ICD-10-CM | POA: Diagnosis not present

## 2016-10-22 DIAGNOSIS — R339 Retention of urine, unspecified: Secondary | ICD-10-CM | POA: Diagnosis not present

## 2016-10-22 DIAGNOSIS — N390 Urinary tract infection, site not specified: Secondary | ICD-10-CM | POA: Diagnosis not present

## 2016-10-22 DIAGNOSIS — E1165 Type 2 diabetes mellitus with hyperglycemia: Secondary | ICD-10-CM | POA: Diagnosis not present

## 2016-10-22 DIAGNOSIS — J441 Chronic obstructive pulmonary disease with (acute) exacerbation: Secondary | ICD-10-CM | POA: Diagnosis not present

## 2016-10-22 DIAGNOSIS — B9562 Methicillin resistant Staphylococcus aureus infection as the cause of diseases classified elsewhere: Secondary | ICD-10-CM | POA: Diagnosis not present

## 2016-10-22 DIAGNOSIS — I2510 Atherosclerotic heart disease of native coronary artery without angina pectoris: Secondary | ICD-10-CM | POA: Diagnosis not present

## 2016-10-23 DIAGNOSIS — I2510 Atherosclerotic heart disease of native coronary artery without angina pectoris: Secondary | ICD-10-CM | POA: Diagnosis not present

## 2016-10-23 DIAGNOSIS — E1165 Type 2 diabetes mellitus with hyperglycemia: Secondary | ICD-10-CM | POA: Diagnosis not present

## 2016-10-23 DIAGNOSIS — R339 Retention of urine, unspecified: Secondary | ICD-10-CM | POA: Diagnosis not present

## 2016-10-23 DIAGNOSIS — B9562 Methicillin resistant Staphylococcus aureus infection as the cause of diseases classified elsewhere: Secondary | ICD-10-CM | POA: Diagnosis not present

## 2016-10-23 DIAGNOSIS — N390 Urinary tract infection, site not specified: Secondary | ICD-10-CM | POA: Diagnosis not present

## 2016-10-23 DIAGNOSIS — J441 Chronic obstructive pulmonary disease with (acute) exacerbation: Secondary | ICD-10-CM | POA: Diagnosis not present

## 2016-10-24 DIAGNOSIS — R339 Retention of urine, unspecified: Secondary | ICD-10-CM | POA: Diagnosis not present

## 2016-10-24 DIAGNOSIS — J441 Chronic obstructive pulmonary disease with (acute) exacerbation: Secondary | ICD-10-CM | POA: Diagnosis not present

## 2016-10-24 DIAGNOSIS — B9562 Methicillin resistant Staphylococcus aureus infection as the cause of diseases classified elsewhere: Secondary | ICD-10-CM | POA: Diagnosis not present

## 2016-10-24 DIAGNOSIS — E1165 Type 2 diabetes mellitus with hyperglycemia: Secondary | ICD-10-CM | POA: Diagnosis not present

## 2016-10-24 DIAGNOSIS — I2510 Atherosclerotic heart disease of native coronary artery without angina pectoris: Secondary | ICD-10-CM | POA: Diagnosis not present

## 2016-10-24 DIAGNOSIS — N390 Urinary tract infection, site not specified: Secondary | ICD-10-CM | POA: Diagnosis not present

## 2016-10-25 DIAGNOSIS — B9562 Methicillin resistant Staphylococcus aureus infection as the cause of diseases classified elsewhere: Secondary | ICD-10-CM | POA: Diagnosis not present

## 2016-10-25 DIAGNOSIS — N390 Urinary tract infection, site not specified: Secondary | ICD-10-CM | POA: Diagnosis not present

## 2016-10-25 DIAGNOSIS — I2510 Atherosclerotic heart disease of native coronary artery without angina pectoris: Secondary | ICD-10-CM | POA: Diagnosis not present

## 2016-10-25 DIAGNOSIS — R339 Retention of urine, unspecified: Secondary | ICD-10-CM | POA: Diagnosis not present

## 2016-10-25 DIAGNOSIS — J441 Chronic obstructive pulmonary disease with (acute) exacerbation: Secondary | ICD-10-CM | POA: Diagnosis not present

## 2016-10-25 DIAGNOSIS — E1165 Type 2 diabetes mellitus with hyperglycemia: Secondary | ICD-10-CM | POA: Diagnosis not present

## 2016-10-26 DIAGNOSIS — B9562 Methicillin resistant Staphylococcus aureus infection as the cause of diseases classified elsewhere: Secondary | ICD-10-CM | POA: Diagnosis not present

## 2016-10-26 DIAGNOSIS — J441 Chronic obstructive pulmonary disease with (acute) exacerbation: Secondary | ICD-10-CM | POA: Diagnosis not present

## 2016-10-26 DIAGNOSIS — E1165 Type 2 diabetes mellitus with hyperglycemia: Secondary | ICD-10-CM | POA: Diagnosis not present

## 2016-10-26 DIAGNOSIS — R339 Retention of urine, unspecified: Secondary | ICD-10-CM | POA: Diagnosis not present

## 2016-10-26 DIAGNOSIS — N390 Urinary tract infection, site not specified: Secondary | ICD-10-CM | POA: Diagnosis not present

## 2016-10-26 DIAGNOSIS — I2510 Atherosclerotic heart disease of native coronary artery without angina pectoris: Secondary | ICD-10-CM | POA: Diagnosis not present

## 2016-10-27 DIAGNOSIS — B9562 Methicillin resistant Staphylococcus aureus infection as the cause of diseases classified elsewhere: Secondary | ICD-10-CM | POA: Diagnosis not present

## 2016-10-27 DIAGNOSIS — E1165 Type 2 diabetes mellitus with hyperglycemia: Secondary | ICD-10-CM | POA: Diagnosis not present

## 2016-10-27 DIAGNOSIS — J441 Chronic obstructive pulmonary disease with (acute) exacerbation: Secondary | ICD-10-CM | POA: Diagnosis not present

## 2016-10-27 DIAGNOSIS — R339 Retention of urine, unspecified: Secondary | ICD-10-CM | POA: Diagnosis not present

## 2016-10-27 DIAGNOSIS — N390 Urinary tract infection, site not specified: Secondary | ICD-10-CM | POA: Diagnosis not present

## 2016-10-27 DIAGNOSIS — I2510 Atherosclerotic heart disease of native coronary artery without angina pectoris: Secondary | ICD-10-CM | POA: Diagnosis not present

## 2016-10-28 DIAGNOSIS — I2510 Atherosclerotic heart disease of native coronary artery without angina pectoris: Secondary | ICD-10-CM | POA: Diagnosis not present

## 2016-10-28 DIAGNOSIS — N390 Urinary tract infection, site not specified: Secondary | ICD-10-CM | POA: Diagnosis not present

## 2016-10-28 DIAGNOSIS — J441 Chronic obstructive pulmonary disease with (acute) exacerbation: Secondary | ICD-10-CM | POA: Diagnosis not present

## 2016-10-28 DIAGNOSIS — E1165 Type 2 diabetes mellitus with hyperglycemia: Secondary | ICD-10-CM | POA: Diagnosis not present

## 2016-10-28 DIAGNOSIS — B9562 Methicillin resistant Staphylococcus aureus infection as the cause of diseases classified elsewhere: Secondary | ICD-10-CM | POA: Diagnosis not present

## 2016-10-28 DIAGNOSIS — R339 Retention of urine, unspecified: Secondary | ICD-10-CM | POA: Diagnosis not present

## 2016-10-29 DIAGNOSIS — E1165 Type 2 diabetes mellitus with hyperglycemia: Secondary | ICD-10-CM | POA: Diagnosis not present

## 2016-10-29 DIAGNOSIS — R339 Retention of urine, unspecified: Secondary | ICD-10-CM | POA: Diagnosis not present

## 2016-10-29 DIAGNOSIS — B9562 Methicillin resistant Staphylococcus aureus infection as the cause of diseases classified elsewhere: Secondary | ICD-10-CM | POA: Diagnosis not present

## 2016-10-29 DIAGNOSIS — I2510 Atherosclerotic heart disease of native coronary artery without angina pectoris: Secondary | ICD-10-CM | POA: Diagnosis not present

## 2016-10-29 DIAGNOSIS — J441 Chronic obstructive pulmonary disease with (acute) exacerbation: Secondary | ICD-10-CM | POA: Diagnosis not present

## 2016-10-29 DIAGNOSIS — N390 Urinary tract infection, site not specified: Secondary | ICD-10-CM | POA: Diagnosis not present

## 2016-10-30 DIAGNOSIS — B9562 Methicillin resistant Staphylococcus aureus infection as the cause of diseases classified elsewhere: Secondary | ICD-10-CM | POA: Diagnosis not present

## 2016-10-30 DIAGNOSIS — N390 Urinary tract infection, site not specified: Secondary | ICD-10-CM | POA: Diagnosis not present

## 2016-10-30 DIAGNOSIS — E1165 Type 2 diabetes mellitus with hyperglycemia: Secondary | ICD-10-CM | POA: Diagnosis not present

## 2016-10-30 DIAGNOSIS — J441 Chronic obstructive pulmonary disease with (acute) exacerbation: Secondary | ICD-10-CM | POA: Diagnosis not present

## 2016-10-30 DIAGNOSIS — I2510 Atherosclerotic heart disease of native coronary artery without angina pectoris: Secondary | ICD-10-CM | POA: Diagnosis not present

## 2016-10-30 DIAGNOSIS — R339 Retention of urine, unspecified: Secondary | ICD-10-CM | POA: Diagnosis not present

## 2016-10-31 DIAGNOSIS — B9562 Methicillin resistant Staphylococcus aureus infection as the cause of diseases classified elsewhere: Secondary | ICD-10-CM | POA: Diagnosis not present

## 2016-10-31 DIAGNOSIS — E1165 Type 2 diabetes mellitus with hyperglycemia: Secondary | ICD-10-CM | POA: Diagnosis not present

## 2016-10-31 DIAGNOSIS — I2510 Atherosclerotic heart disease of native coronary artery without angina pectoris: Secondary | ICD-10-CM | POA: Diagnosis not present

## 2016-10-31 DIAGNOSIS — N390 Urinary tract infection, site not specified: Secondary | ICD-10-CM | POA: Diagnosis not present

## 2016-10-31 DIAGNOSIS — R339 Retention of urine, unspecified: Secondary | ICD-10-CM | POA: Diagnosis not present

## 2016-10-31 DIAGNOSIS — J441 Chronic obstructive pulmonary disease with (acute) exacerbation: Secondary | ICD-10-CM | POA: Diagnosis not present

## 2016-11-01 DIAGNOSIS — R339 Retention of urine, unspecified: Secondary | ICD-10-CM | POA: Diagnosis not present

## 2016-11-01 DIAGNOSIS — B9562 Methicillin resistant Staphylococcus aureus infection as the cause of diseases classified elsewhere: Secondary | ICD-10-CM | POA: Diagnosis not present

## 2016-11-01 DIAGNOSIS — J441 Chronic obstructive pulmonary disease with (acute) exacerbation: Secondary | ICD-10-CM | POA: Diagnosis not present

## 2016-11-01 DIAGNOSIS — E1165 Type 2 diabetes mellitus with hyperglycemia: Secondary | ICD-10-CM | POA: Diagnosis not present

## 2016-11-01 DIAGNOSIS — I2510 Atherosclerotic heart disease of native coronary artery without angina pectoris: Secondary | ICD-10-CM | POA: Diagnosis not present

## 2016-11-01 DIAGNOSIS — N390 Urinary tract infection, site not specified: Secondary | ICD-10-CM | POA: Diagnosis not present

## 2016-11-02 DIAGNOSIS — R339 Retention of urine, unspecified: Secondary | ICD-10-CM | POA: Diagnosis not present

## 2016-11-02 DIAGNOSIS — I2510 Atherosclerotic heart disease of native coronary artery without angina pectoris: Secondary | ICD-10-CM | POA: Diagnosis not present

## 2016-11-02 DIAGNOSIS — E1165 Type 2 diabetes mellitus with hyperglycemia: Secondary | ICD-10-CM | POA: Diagnosis not present

## 2016-11-02 DIAGNOSIS — N390 Urinary tract infection, site not specified: Secondary | ICD-10-CM | POA: Diagnosis not present

## 2016-11-02 DIAGNOSIS — J441 Chronic obstructive pulmonary disease with (acute) exacerbation: Secondary | ICD-10-CM | POA: Diagnosis not present

## 2016-11-02 DIAGNOSIS — B9562 Methicillin resistant Staphylococcus aureus infection as the cause of diseases classified elsewhere: Secondary | ICD-10-CM | POA: Diagnosis not present

## 2016-11-03 DIAGNOSIS — R339 Retention of urine, unspecified: Secondary | ICD-10-CM | POA: Diagnosis not present

## 2016-11-03 DIAGNOSIS — J441 Chronic obstructive pulmonary disease with (acute) exacerbation: Secondary | ICD-10-CM | POA: Diagnosis not present

## 2016-11-03 DIAGNOSIS — E1165 Type 2 diabetes mellitus with hyperglycemia: Secondary | ICD-10-CM | POA: Diagnosis not present

## 2016-11-03 DIAGNOSIS — B9562 Methicillin resistant Staphylococcus aureus infection as the cause of diseases classified elsewhere: Secondary | ICD-10-CM | POA: Diagnosis not present

## 2016-11-03 DIAGNOSIS — N390 Urinary tract infection, site not specified: Secondary | ICD-10-CM | POA: Diagnosis not present

## 2016-11-03 DIAGNOSIS — I2510 Atherosclerotic heart disease of native coronary artery without angina pectoris: Secondary | ICD-10-CM | POA: Diagnosis not present

## 2016-11-04 DIAGNOSIS — H409 Unspecified glaucoma: Secondary | ICD-10-CM | POA: Diagnosis not present

## 2016-11-04 DIAGNOSIS — Z9981 Dependence on supplemental oxygen: Secondary | ICD-10-CM | POA: Diagnosis not present

## 2016-11-04 DIAGNOSIS — D509 Iron deficiency anemia, unspecified: Secondary | ICD-10-CM | POA: Diagnosis not present

## 2016-11-04 DIAGNOSIS — I10 Essential (primary) hypertension: Secondary | ICD-10-CM | POA: Diagnosis not present

## 2016-11-04 DIAGNOSIS — B9562 Methicillin resistant Staphylococcus aureus infection as the cause of diseases classified elsewhere: Secondary | ICD-10-CM | POA: Diagnosis not present

## 2016-11-04 DIAGNOSIS — Z8673 Personal history of transient ischemic attack (TIA), and cerebral infarction without residual deficits: Secondary | ICD-10-CM | POA: Diagnosis not present

## 2016-11-04 DIAGNOSIS — J441 Chronic obstructive pulmonary disease with (acute) exacerbation: Secondary | ICD-10-CM | POA: Diagnosis not present

## 2016-11-04 DIAGNOSIS — Z87891 Personal history of nicotine dependence: Secondary | ICD-10-CM | POA: Diagnosis not present

## 2016-11-04 DIAGNOSIS — E1165 Type 2 diabetes mellitus with hyperglycemia: Secondary | ICD-10-CM | POA: Diagnosis not present

## 2016-11-04 DIAGNOSIS — I252 Old myocardial infarction: Secondary | ICD-10-CM | POA: Diagnosis not present

## 2016-11-04 DIAGNOSIS — I2510 Atherosclerotic heart disease of native coronary artery without angina pectoris: Secondary | ICD-10-CM | POA: Diagnosis not present

## 2016-11-04 DIAGNOSIS — E782 Mixed hyperlipidemia: Secondary | ICD-10-CM | POA: Diagnosis not present

## 2016-11-04 DIAGNOSIS — E559 Vitamin D deficiency, unspecified: Secondary | ICD-10-CM | POA: Diagnosis not present

## 2016-11-04 DIAGNOSIS — R339 Retention of urine, unspecified: Secondary | ICD-10-CM | POA: Diagnosis not present

## 2016-11-04 DIAGNOSIS — N400 Enlarged prostate without lower urinary tract symptoms: Secondary | ICD-10-CM | POA: Diagnosis not present

## 2016-11-04 DIAGNOSIS — N390 Urinary tract infection, site not specified: Secondary | ICD-10-CM | POA: Diagnosis not present

## 2016-11-04 DIAGNOSIS — Z466 Encounter for fitting and adjustment of urinary device: Secondary | ICD-10-CM | POA: Diagnosis not present

## 2016-11-05 DIAGNOSIS — J441 Chronic obstructive pulmonary disease with (acute) exacerbation: Secondary | ICD-10-CM | POA: Diagnosis not present

## 2016-11-05 DIAGNOSIS — E1165 Type 2 diabetes mellitus with hyperglycemia: Secondary | ICD-10-CM | POA: Diagnosis not present

## 2016-11-05 DIAGNOSIS — I2510 Atherosclerotic heart disease of native coronary artery without angina pectoris: Secondary | ICD-10-CM | POA: Diagnosis not present

## 2016-11-05 DIAGNOSIS — R339 Retention of urine, unspecified: Secondary | ICD-10-CM | POA: Diagnosis not present

## 2016-11-05 DIAGNOSIS — B9562 Methicillin resistant Staphylococcus aureus infection as the cause of diseases classified elsewhere: Secondary | ICD-10-CM | POA: Diagnosis not present

## 2016-11-05 DIAGNOSIS — N390 Urinary tract infection, site not specified: Secondary | ICD-10-CM | POA: Diagnosis not present

## 2016-11-06 DIAGNOSIS — J441 Chronic obstructive pulmonary disease with (acute) exacerbation: Secondary | ICD-10-CM | POA: Diagnosis not present

## 2016-11-06 DIAGNOSIS — N390 Urinary tract infection, site not specified: Secondary | ICD-10-CM | POA: Diagnosis not present

## 2016-11-06 DIAGNOSIS — I2510 Atherosclerotic heart disease of native coronary artery without angina pectoris: Secondary | ICD-10-CM | POA: Diagnosis not present

## 2016-11-06 DIAGNOSIS — B9562 Methicillin resistant Staphylococcus aureus infection as the cause of diseases classified elsewhere: Secondary | ICD-10-CM | POA: Diagnosis not present

## 2016-11-06 DIAGNOSIS — R339 Retention of urine, unspecified: Secondary | ICD-10-CM | POA: Diagnosis not present

## 2016-11-06 DIAGNOSIS — E1165 Type 2 diabetes mellitus with hyperglycemia: Secondary | ICD-10-CM | POA: Diagnosis not present

## 2016-11-07 DIAGNOSIS — B9562 Methicillin resistant Staphylococcus aureus infection as the cause of diseases classified elsewhere: Secondary | ICD-10-CM | POA: Diagnosis not present

## 2016-11-07 DIAGNOSIS — E1165 Type 2 diabetes mellitus with hyperglycemia: Secondary | ICD-10-CM | POA: Diagnosis not present

## 2016-11-07 DIAGNOSIS — I2510 Atherosclerotic heart disease of native coronary artery without angina pectoris: Secondary | ICD-10-CM | POA: Diagnosis not present

## 2016-11-07 DIAGNOSIS — R339 Retention of urine, unspecified: Secondary | ICD-10-CM | POA: Diagnosis not present

## 2016-11-07 DIAGNOSIS — J441 Chronic obstructive pulmonary disease with (acute) exacerbation: Secondary | ICD-10-CM | POA: Diagnosis not present

## 2016-11-07 DIAGNOSIS — N390 Urinary tract infection, site not specified: Secondary | ICD-10-CM | POA: Diagnosis not present

## 2016-11-08 DIAGNOSIS — R339 Retention of urine, unspecified: Secondary | ICD-10-CM | POA: Diagnosis not present

## 2016-11-08 DIAGNOSIS — E1165 Type 2 diabetes mellitus with hyperglycemia: Secondary | ICD-10-CM | POA: Diagnosis not present

## 2016-11-08 DIAGNOSIS — N390 Urinary tract infection, site not specified: Secondary | ICD-10-CM | POA: Diagnosis not present

## 2016-11-08 DIAGNOSIS — J441 Chronic obstructive pulmonary disease with (acute) exacerbation: Secondary | ICD-10-CM | POA: Diagnosis not present

## 2016-11-08 DIAGNOSIS — I2510 Atherosclerotic heart disease of native coronary artery without angina pectoris: Secondary | ICD-10-CM | POA: Diagnosis not present

## 2016-11-08 DIAGNOSIS — B9562 Methicillin resistant Staphylococcus aureus infection as the cause of diseases classified elsewhere: Secondary | ICD-10-CM | POA: Diagnosis not present

## 2016-11-09 DIAGNOSIS — N390 Urinary tract infection, site not specified: Secondary | ICD-10-CM | POA: Diagnosis not present

## 2016-11-09 DIAGNOSIS — R339 Retention of urine, unspecified: Secondary | ICD-10-CM | POA: Diagnosis not present

## 2016-11-09 DIAGNOSIS — I2510 Atherosclerotic heart disease of native coronary artery without angina pectoris: Secondary | ICD-10-CM | POA: Diagnosis not present

## 2016-11-09 DIAGNOSIS — B9562 Methicillin resistant Staphylococcus aureus infection as the cause of diseases classified elsewhere: Secondary | ICD-10-CM | POA: Diagnosis not present

## 2016-11-09 DIAGNOSIS — J441 Chronic obstructive pulmonary disease with (acute) exacerbation: Secondary | ICD-10-CM | POA: Diagnosis not present

## 2016-11-09 DIAGNOSIS — E1165 Type 2 diabetes mellitus with hyperglycemia: Secondary | ICD-10-CM | POA: Diagnosis not present

## 2016-11-10 DIAGNOSIS — R339 Retention of urine, unspecified: Secondary | ICD-10-CM | POA: Diagnosis not present

## 2016-11-10 DIAGNOSIS — E1165 Type 2 diabetes mellitus with hyperglycemia: Secondary | ICD-10-CM | POA: Diagnosis not present

## 2016-11-10 DIAGNOSIS — B9562 Methicillin resistant Staphylococcus aureus infection as the cause of diseases classified elsewhere: Secondary | ICD-10-CM | POA: Diagnosis not present

## 2016-11-10 DIAGNOSIS — N390 Urinary tract infection, site not specified: Secondary | ICD-10-CM | POA: Diagnosis not present

## 2016-11-10 DIAGNOSIS — I2510 Atherosclerotic heart disease of native coronary artery without angina pectoris: Secondary | ICD-10-CM | POA: Diagnosis not present

## 2016-11-10 DIAGNOSIS — J441 Chronic obstructive pulmonary disease with (acute) exacerbation: Secondary | ICD-10-CM | POA: Diagnosis not present

## 2016-11-11 DIAGNOSIS — B9562 Methicillin resistant Staphylococcus aureus infection as the cause of diseases classified elsewhere: Secondary | ICD-10-CM | POA: Diagnosis not present

## 2016-11-11 DIAGNOSIS — I2510 Atherosclerotic heart disease of native coronary artery without angina pectoris: Secondary | ICD-10-CM | POA: Diagnosis not present

## 2016-11-11 DIAGNOSIS — R339 Retention of urine, unspecified: Secondary | ICD-10-CM | POA: Diagnosis not present

## 2016-11-11 DIAGNOSIS — E1165 Type 2 diabetes mellitus with hyperglycemia: Secondary | ICD-10-CM | POA: Diagnosis not present

## 2016-11-11 DIAGNOSIS — N390 Urinary tract infection, site not specified: Secondary | ICD-10-CM | POA: Diagnosis not present

## 2016-11-11 DIAGNOSIS — J441 Chronic obstructive pulmonary disease with (acute) exacerbation: Secondary | ICD-10-CM | POA: Diagnosis not present

## 2016-11-12 DIAGNOSIS — J441 Chronic obstructive pulmonary disease with (acute) exacerbation: Secondary | ICD-10-CM | POA: Diagnosis not present

## 2016-11-12 DIAGNOSIS — E1165 Type 2 diabetes mellitus with hyperglycemia: Secondary | ICD-10-CM | POA: Diagnosis not present

## 2016-11-12 DIAGNOSIS — R339 Retention of urine, unspecified: Secondary | ICD-10-CM | POA: Diagnosis not present

## 2016-11-12 DIAGNOSIS — N390 Urinary tract infection, site not specified: Secondary | ICD-10-CM | POA: Diagnosis not present

## 2016-11-12 DIAGNOSIS — I2510 Atherosclerotic heart disease of native coronary artery without angina pectoris: Secondary | ICD-10-CM | POA: Diagnosis not present

## 2016-11-12 DIAGNOSIS — B9562 Methicillin resistant Staphylococcus aureus infection as the cause of diseases classified elsewhere: Secondary | ICD-10-CM | POA: Diagnosis not present

## 2016-11-13 DIAGNOSIS — B9562 Methicillin resistant Staphylococcus aureus infection as the cause of diseases classified elsewhere: Secondary | ICD-10-CM | POA: Diagnosis not present

## 2016-11-13 DIAGNOSIS — R339 Retention of urine, unspecified: Secondary | ICD-10-CM | POA: Diagnosis not present

## 2016-11-13 DIAGNOSIS — E1165 Type 2 diabetes mellitus with hyperglycemia: Secondary | ICD-10-CM | POA: Diagnosis not present

## 2016-11-13 DIAGNOSIS — I2510 Atherosclerotic heart disease of native coronary artery without angina pectoris: Secondary | ICD-10-CM | POA: Diagnosis not present

## 2016-11-13 DIAGNOSIS — J441 Chronic obstructive pulmonary disease with (acute) exacerbation: Secondary | ICD-10-CM | POA: Diagnosis not present

## 2016-11-13 DIAGNOSIS — N390 Urinary tract infection, site not specified: Secondary | ICD-10-CM | POA: Diagnosis not present

## 2016-11-14 DIAGNOSIS — I2510 Atherosclerotic heart disease of native coronary artery without angina pectoris: Secondary | ICD-10-CM | POA: Diagnosis not present

## 2016-11-14 DIAGNOSIS — R339 Retention of urine, unspecified: Secondary | ICD-10-CM | POA: Diagnosis not present

## 2016-11-14 DIAGNOSIS — E1165 Type 2 diabetes mellitus with hyperglycemia: Secondary | ICD-10-CM | POA: Diagnosis not present

## 2016-11-14 DIAGNOSIS — J441 Chronic obstructive pulmonary disease with (acute) exacerbation: Secondary | ICD-10-CM | POA: Diagnosis not present

## 2016-11-14 DIAGNOSIS — B9562 Methicillin resistant Staphylococcus aureus infection as the cause of diseases classified elsewhere: Secondary | ICD-10-CM | POA: Diagnosis not present

## 2016-11-14 DIAGNOSIS — N390 Urinary tract infection, site not specified: Secondary | ICD-10-CM | POA: Diagnosis not present

## 2016-11-15 DIAGNOSIS — J441 Chronic obstructive pulmonary disease with (acute) exacerbation: Secondary | ICD-10-CM | POA: Diagnosis not present

## 2016-11-15 DIAGNOSIS — N390 Urinary tract infection, site not specified: Secondary | ICD-10-CM | POA: Diagnosis not present

## 2016-11-15 DIAGNOSIS — B9562 Methicillin resistant Staphylococcus aureus infection as the cause of diseases classified elsewhere: Secondary | ICD-10-CM | POA: Diagnosis not present

## 2016-11-15 DIAGNOSIS — E1165 Type 2 diabetes mellitus with hyperglycemia: Secondary | ICD-10-CM | POA: Diagnosis not present

## 2016-11-15 DIAGNOSIS — R339 Retention of urine, unspecified: Secondary | ICD-10-CM | POA: Diagnosis not present

## 2016-11-15 DIAGNOSIS — I2510 Atherosclerotic heart disease of native coronary artery without angina pectoris: Secondary | ICD-10-CM | POA: Diagnosis not present

## 2016-11-16 DIAGNOSIS — R339 Retention of urine, unspecified: Secondary | ICD-10-CM | POA: Diagnosis not present

## 2016-11-16 DIAGNOSIS — B9562 Methicillin resistant Staphylococcus aureus infection as the cause of diseases classified elsewhere: Secondary | ICD-10-CM | POA: Diagnosis not present

## 2016-11-16 DIAGNOSIS — J441 Chronic obstructive pulmonary disease with (acute) exacerbation: Secondary | ICD-10-CM | POA: Diagnosis not present

## 2016-11-16 DIAGNOSIS — I2510 Atherosclerotic heart disease of native coronary artery without angina pectoris: Secondary | ICD-10-CM | POA: Diagnosis not present

## 2016-11-16 DIAGNOSIS — N390 Urinary tract infection, site not specified: Secondary | ICD-10-CM | POA: Diagnosis not present

## 2016-11-16 DIAGNOSIS — E1165 Type 2 diabetes mellitus with hyperglycemia: Secondary | ICD-10-CM | POA: Diagnosis not present

## 2016-11-17 DIAGNOSIS — J441 Chronic obstructive pulmonary disease with (acute) exacerbation: Secondary | ICD-10-CM | POA: Diagnosis not present

## 2016-11-17 DIAGNOSIS — R339 Retention of urine, unspecified: Secondary | ICD-10-CM | POA: Diagnosis not present

## 2016-11-17 DIAGNOSIS — B9562 Methicillin resistant Staphylococcus aureus infection as the cause of diseases classified elsewhere: Secondary | ICD-10-CM | POA: Diagnosis not present

## 2016-11-17 DIAGNOSIS — N390 Urinary tract infection, site not specified: Secondary | ICD-10-CM | POA: Diagnosis not present

## 2016-11-17 DIAGNOSIS — E1165 Type 2 diabetes mellitus with hyperglycemia: Secondary | ICD-10-CM | POA: Diagnosis not present

## 2016-11-17 DIAGNOSIS — I2510 Atherosclerotic heart disease of native coronary artery without angina pectoris: Secondary | ICD-10-CM | POA: Diagnosis not present

## 2016-11-18 DIAGNOSIS — J441 Chronic obstructive pulmonary disease with (acute) exacerbation: Secondary | ICD-10-CM | POA: Diagnosis not present

## 2016-11-18 DIAGNOSIS — I2510 Atherosclerotic heart disease of native coronary artery without angina pectoris: Secondary | ICD-10-CM | POA: Diagnosis not present

## 2016-11-18 DIAGNOSIS — B9562 Methicillin resistant Staphylococcus aureus infection as the cause of diseases classified elsewhere: Secondary | ICD-10-CM | POA: Diagnosis not present

## 2016-11-18 DIAGNOSIS — R339 Retention of urine, unspecified: Secondary | ICD-10-CM | POA: Diagnosis not present

## 2016-11-18 DIAGNOSIS — E1165 Type 2 diabetes mellitus with hyperglycemia: Secondary | ICD-10-CM | POA: Diagnosis not present

## 2016-11-18 DIAGNOSIS — N390 Urinary tract infection, site not specified: Secondary | ICD-10-CM | POA: Diagnosis not present

## 2016-11-19 DIAGNOSIS — I2510 Atherosclerotic heart disease of native coronary artery without angina pectoris: Secondary | ICD-10-CM | POA: Diagnosis not present

## 2016-11-19 DIAGNOSIS — E1165 Type 2 diabetes mellitus with hyperglycemia: Secondary | ICD-10-CM | POA: Diagnosis not present

## 2016-11-19 DIAGNOSIS — R339 Retention of urine, unspecified: Secondary | ICD-10-CM | POA: Diagnosis not present

## 2016-11-19 DIAGNOSIS — J441 Chronic obstructive pulmonary disease with (acute) exacerbation: Secondary | ICD-10-CM | POA: Diagnosis not present

## 2016-11-19 DIAGNOSIS — N390 Urinary tract infection, site not specified: Secondary | ICD-10-CM | POA: Diagnosis not present

## 2016-11-19 DIAGNOSIS — B9562 Methicillin resistant Staphylococcus aureus infection as the cause of diseases classified elsewhere: Secondary | ICD-10-CM | POA: Diagnosis not present

## 2016-11-20 DIAGNOSIS — N390 Urinary tract infection, site not specified: Secondary | ICD-10-CM | POA: Diagnosis not present

## 2016-11-20 DIAGNOSIS — J441 Chronic obstructive pulmonary disease with (acute) exacerbation: Secondary | ICD-10-CM | POA: Diagnosis not present

## 2016-11-20 DIAGNOSIS — E1165 Type 2 diabetes mellitus with hyperglycemia: Secondary | ICD-10-CM | POA: Diagnosis not present

## 2016-11-20 DIAGNOSIS — B9562 Methicillin resistant Staphylococcus aureus infection as the cause of diseases classified elsewhere: Secondary | ICD-10-CM | POA: Diagnosis not present

## 2016-11-20 DIAGNOSIS — I2510 Atherosclerotic heart disease of native coronary artery without angina pectoris: Secondary | ICD-10-CM | POA: Diagnosis not present

## 2016-11-20 DIAGNOSIS — R339 Retention of urine, unspecified: Secondary | ICD-10-CM | POA: Diagnosis not present

## 2016-11-21 DIAGNOSIS — J441 Chronic obstructive pulmonary disease with (acute) exacerbation: Secondary | ICD-10-CM | POA: Diagnosis not present

## 2016-11-21 DIAGNOSIS — I2510 Atherosclerotic heart disease of native coronary artery without angina pectoris: Secondary | ICD-10-CM | POA: Diagnosis not present

## 2016-11-21 DIAGNOSIS — B9562 Methicillin resistant Staphylococcus aureus infection as the cause of diseases classified elsewhere: Secondary | ICD-10-CM | POA: Diagnosis not present

## 2016-11-21 DIAGNOSIS — N390 Urinary tract infection, site not specified: Secondary | ICD-10-CM | POA: Diagnosis not present

## 2016-11-21 DIAGNOSIS — R339 Retention of urine, unspecified: Secondary | ICD-10-CM | POA: Diagnosis not present

## 2016-11-21 DIAGNOSIS — E1165 Type 2 diabetes mellitus with hyperglycemia: Secondary | ICD-10-CM | POA: Diagnosis not present

## 2016-11-22 DIAGNOSIS — E1165 Type 2 diabetes mellitus with hyperglycemia: Secondary | ICD-10-CM | POA: Diagnosis not present

## 2016-11-22 DIAGNOSIS — B9562 Methicillin resistant Staphylococcus aureus infection as the cause of diseases classified elsewhere: Secondary | ICD-10-CM | POA: Diagnosis not present

## 2016-11-22 DIAGNOSIS — N390 Urinary tract infection, site not specified: Secondary | ICD-10-CM | POA: Diagnosis not present

## 2016-11-22 DIAGNOSIS — R339 Retention of urine, unspecified: Secondary | ICD-10-CM | POA: Diagnosis not present

## 2016-11-22 DIAGNOSIS — I2510 Atherosclerotic heart disease of native coronary artery without angina pectoris: Secondary | ICD-10-CM | POA: Diagnosis not present

## 2016-11-22 DIAGNOSIS — J441 Chronic obstructive pulmonary disease with (acute) exacerbation: Secondary | ICD-10-CM | POA: Diagnosis not present

## 2016-11-23 DIAGNOSIS — E1165 Type 2 diabetes mellitus with hyperglycemia: Secondary | ICD-10-CM | POA: Diagnosis not present

## 2016-11-23 DIAGNOSIS — R339 Retention of urine, unspecified: Secondary | ICD-10-CM | POA: Diagnosis not present

## 2016-11-23 DIAGNOSIS — I2510 Atherosclerotic heart disease of native coronary artery without angina pectoris: Secondary | ICD-10-CM | POA: Diagnosis not present

## 2016-11-23 DIAGNOSIS — J441 Chronic obstructive pulmonary disease with (acute) exacerbation: Secondary | ICD-10-CM | POA: Diagnosis not present

## 2016-11-23 DIAGNOSIS — N390 Urinary tract infection, site not specified: Secondary | ICD-10-CM | POA: Diagnosis not present

## 2016-11-23 DIAGNOSIS — B9562 Methicillin resistant Staphylococcus aureus infection as the cause of diseases classified elsewhere: Secondary | ICD-10-CM | POA: Diagnosis not present

## 2016-11-24 DIAGNOSIS — I2510 Atherosclerotic heart disease of native coronary artery without angina pectoris: Secondary | ICD-10-CM | POA: Diagnosis not present

## 2016-11-24 DIAGNOSIS — E1165 Type 2 diabetes mellitus with hyperglycemia: Secondary | ICD-10-CM | POA: Diagnosis not present

## 2016-11-24 DIAGNOSIS — B9562 Methicillin resistant Staphylococcus aureus infection as the cause of diseases classified elsewhere: Secondary | ICD-10-CM | POA: Diagnosis not present

## 2016-11-24 DIAGNOSIS — N390 Urinary tract infection, site not specified: Secondary | ICD-10-CM | POA: Diagnosis not present

## 2016-11-24 DIAGNOSIS — R339 Retention of urine, unspecified: Secondary | ICD-10-CM | POA: Diagnosis not present

## 2016-11-24 DIAGNOSIS — J441 Chronic obstructive pulmonary disease with (acute) exacerbation: Secondary | ICD-10-CM | POA: Diagnosis not present

## 2016-11-25 ENCOUNTER — Emergency Department
Admission: EM | Admit: 2016-11-25 | Discharge: 2016-11-25 | Disposition: A | Discharge status: Home / Self Care | Payer: Medicare Other | Attending: Emergency Medicine | Admitting: Emergency Medicine

## 2016-11-25 ENCOUNTER — Encounter: Payer: Self-pay | Admitting: Emergency Medicine

## 2016-11-25 DIAGNOSIS — R339 Retention of urine, unspecified: Secondary | ICD-10-CM | POA: Diagnosis present

## 2016-11-25 DIAGNOSIS — E119 Type 2 diabetes mellitus without complications: Secondary | ICD-10-CM | POA: Diagnosis not present

## 2016-11-25 DIAGNOSIS — Z87891 Personal history of nicotine dependence: Secondary | ICD-10-CM | POA: Insufficient documentation

## 2016-11-25 DIAGNOSIS — J449 Chronic obstructive pulmonary disease, unspecified: Secondary | ICD-10-CM | POA: Insufficient documentation

## 2016-11-25 DIAGNOSIS — R32 Unspecified urinary incontinence: Secondary | ICD-10-CM | POA: Diagnosis not present

## 2016-11-25 DIAGNOSIS — Z794 Long term (current) use of insulin: Secondary | ICD-10-CM | POA: Insufficient documentation

## 2016-11-25 DIAGNOSIS — Z79899 Other long term (current) drug therapy: Secondary | ICD-10-CM | POA: Insufficient documentation

## 2016-11-25 DIAGNOSIS — N390 Urinary tract infection, site not specified: Secondary | ICD-10-CM

## 2016-11-25 DIAGNOSIS — J9610 Chronic respiratory failure, unspecified whether with hypoxia or hypercapnia: Secondary | ICD-10-CM | POA: Diagnosis not present

## 2016-11-25 LAB — URINALYSIS, COMPLETE (UACMP) WITH MICROSCOPIC
Bilirubin Urine: NEGATIVE
Glucose, UA: 50 mg/dL — AB
Ketones, ur: NEGATIVE mg/dL
Nitrite: POSITIVE — AB
Protein, ur: 30 mg/dL — AB
Specific Gravity, Urine: 1.014 (ref 1.005–1.030)
Squamous Epithelial / HPF: NONE SEEN
pH: 5 (ref 5.0–8.0)

## 2016-11-25 LAB — CBC WITH DIFFERENTIAL/PLATELET
Basophils Absolute: 0.1 10*3/uL (ref 0–0.1)
Basophils Relative: 1 %
Eosinophils Absolute: 0.2 10*3/uL (ref 0–0.7)
Eosinophils Relative: 2 %
HCT: 33.7 % — ABNORMAL LOW (ref 40.0–52.0)
Hemoglobin: 11.2 g/dL — ABNORMAL LOW (ref 13.0–18.0)
Lymphocytes Relative: 10 %
Lymphs Abs: 0.8 10*3/uL — ABNORMAL LOW (ref 1.0–3.6)
MCH: 32.1 pg (ref 26.0–34.0)
MCHC: 33.1 g/dL (ref 32.0–36.0)
MCV: 96.8 fL (ref 80.0–100.0)
Monocytes Absolute: 1.1 10*3/uL — ABNORMAL HIGH (ref 0.2–1.0)
Monocytes Relative: 13 %
Neutro Abs: 6 10*3/uL (ref 1.4–6.5)
Neutrophils Relative %: 74 %
Platelets: 360 10*3/uL (ref 150–440)
RBC: 3.48 MIL/uL — ABNORMAL LOW (ref 4.40–5.90)
RDW: 16 % — ABNORMAL HIGH (ref 11.5–14.5)
WBC: 8.2 10*3/uL (ref 3.8–10.6)

## 2016-11-25 LAB — BASIC METABOLIC PANEL WITH GFR
Anion gap: 6 (ref 5–15)
BUN: 27 mg/dL — ABNORMAL HIGH (ref 6–20)
CO2: 27 mmol/L (ref 22–32)
Calcium: 9.1 mg/dL (ref 8.9–10.3)
Chloride: 103 mmol/L (ref 101–111)
Creatinine, Ser: 1.41 mg/dL — ABNORMAL HIGH (ref 0.61–1.24)
GFR calc Af Amer: 51 mL/min — ABNORMAL LOW
GFR calc non Af Amer: 44 mL/min — ABNORMAL LOW
Glucose, Bld: 171 mg/dL — ABNORMAL HIGH (ref 65–99)
Potassium: 4.1 mmol/L (ref 3.5–5.1)
Sodium: 136 mmol/L (ref 135–145)

## 2016-11-25 MED ORDER — CIPROFLOXACIN HCL 500 MG PO TABS: 500.0000 mg | ORAL_TABLET | Freq: Two times a day (BID) | ORAL | 0 refills | Status: AC

## 2016-11-25 MED ORDER — DEXTROSE 5 % IV SOLN: 1.0000 g | Freq: Once | INTRAVENOUS | Status: DC

## 2016-11-25 MED ORDER — CEFTRIAXONE SODIUM-DEXTROSE 1-3.74 GM-% IV SOLR
1.0000 g | Freq: Once | INTRAVENOUS | Status: AC
  Administered 2016-11-25: 1 g via INTRAVENOUS

## 2016-11-25 MED ORDER — CEFTRIAXONE SODIUM-DEXTROSE 1-3.74 GM-% IV SOLR
INTRAVENOUS | Status: AC
  Administered 2016-11-25: 1 g via INTRAVENOUS
  Filled 2016-11-25: qty 50

## 2016-11-25 MED ORDER — SODIUM CHLORIDE 0.9 % IV BOLUS (SEPSIS)
1000.0000 mL | Freq: Once | INTRAVENOUS | Status: AC
  Administered 2016-11-25: 1000 mL via INTRAVENOUS

## 2016-11-25 NOTE — ED Provider Notes (Signed)
[facility] Emergency Department Provider Note  ____________________________________________  Time seen: Approximately 4:51 PM  I have reviewed the triage vital signs and the nursing notes.   HISTORY  Chief Complaint Urinary Retention   HPI Joseph [name] is a 83 y.o. male with a history of urinary retention status post Foley catheter who presents for evaluation of suprapubic abdominal pain and penile pain. Patient has had a Foley catheter for about 6 months according to him. He is followed at [facility] urology. Yesterday started having some leaking around the catheter and he was complaining of penile pain. The catheter was replaced and today symptoms recurred. He is complaining of mild, constant, suprapubic pressure, non radiating and constant and also complains of burning sensation in his penis. Has had no flank pain, no fever, no chills, no nausea or vomiting.  Past Medical History:  Diagnosis Date  . Abdominal aneurysm without mention of rupture   . COPD (chronic obstructive pulmonary disease) ([place])   . Diabetes mellitus, type 2 ([place])   . Emphysema of lung ([place])   . Hyperlipidemia   . Hypotension   . Myocardial infarction   . Stroke [facility]) April 12, 2013   Right arm weakness  . Thyroid disease    Hyperthyroidism, Goiter    Patient Active Problem List   Diagnosis Date Noted  . CVA (cerebral infarction) 04/12/2013  . Diabetes mellitus, type 2 ([place])   . Hyperlipidemia   . Thyroid disease   . Abdominal aneurysm without mention of rupture 12/01/2011  . Hypotension, unspecified 10/03/2011    Class: Acute  . Chronic respiratory failure ([place]) 10/03/2011    Class: Chronic  . Weakness generalized 09/27/2011  . COPD (chronic obstructive pulmonary disease) ([place]) 09/19/2011  . Acute-on-chronic respiratory failure ([place]) 09/19/2011  . CAD (coronary artery disease), native coronary artery 09/19/2011  . Peripheral vascular disease ([place]) 09/19/2011     Past Surgical History:  Procedure Laterality Date  . ABDOMINAL AORTA STENT     ENDOSTENT REPAIR 10/15/2010  . ABDOMINAL AORTIC ANEURYSM REPAIR  05/26/11   PEVAR  . HERNIA REPAIR     LEFT INGUINAL  . PROSTATE SURGERY  Sept. 2012    Prior to Admission medications   Medication Sig Start Date End Date Taking? Authorizing Provider  acetaminophen (TYLENOL) 325 MG tablet Take 650 mg by mouth every 6 (six) hours as needed.   Yes Historical Provider, MD  albuterol (ACCUNEB) 1.25 MG/3ML nebulizer solution Take 1 ampule by nebulization every 4 (four) hours as needed for wheezing or shortness of breath.   Yes Historical Provider, MD  brinzolamide (AZOPT) 1 % ophthalmic suspension Place 1 drop into the left eye every morning.    Yes Historical Provider, MD  budesonide (PULMICORT FLEXHALER) 180 MCG/ACT inhaler Inhale 1 puff into the lungs 2 (two) times daily.     Yes Historical Provider, MD  cholecalciferol (VITAMIN D) 1000 UNITS tablet Take 1,000 Units by mouth daily.   Yes Historical Provider, MD  clopidogrel (PLAVIX) 75 MG tablet Take 1 tablet (75 mg total) by mouth daily with breakfast. 04/14/13  Yes Lavone Orn, MD  Cyanocobalamin (B-12) 1000 MCG/ML KIT Inject 1 mL as directed every 30 (thirty) days.   Yes Historical Provider, MD  fentaNYL (DURAGESIC - DOSED MCG/HR) 25 MCG/HR patch Place 25 mcg onto the skin every 3 (three) days.   Yes Historical Provider, MD  hydrochlorothiazide (HYDRODIURIL) 25 MG tablet Take 25 mg by mouth daily as needed (swelling).   Yes Historical  Provider, MD  HYDROcodone-acetaminophen (NORCO/VICODIN) 5-325 MG tablet Take 1 tablet by mouth every 8 (eight) hours.   Yes Historical Provider, MD  ipratropium-albuterol (DUONEB) 0.5-2.5 (3) MG/3ML SOLN Take 3 mLs by nebulization 3 (three) times daily.   Yes Historical Provider, MD  metFORMIN (GLUCOPHAGE) 500 MG tablet Take 500 mg by mouth 2 (two) times daily with a meal.     Yes Historical Provider, MD  MYRBETRIQ 50 MG TB24  tablet Take 50 mg by mouth daily. 10/01/16  Yes Historical Provider, MD  travoprost, benzalkonium, (TRAVATAN) 0.004 % ophthalmic solution Place 1 drop into both eyes at bedtime.    Yes Historical Provider, MD  ciprofloxacin (CIPRO) 500 MG tablet Take 1 tablet (500 mg total) by mouth 2 (two) times daily. 11/25/16 12/05/16  Rudene Re, MD  insulin glargine (LANTUS) 100 UNIT/ML injection Inject 10 Units into the skin at bedtime. 10/01/11 09/30/12  Josetta Huddle, MD    Allergies Other and Statins  Family History  Problem Relation Age of Onset  . Alzheimer's disease Father     Social History Social History  Substance Use Topics  . Smoking status: Former Smoker    Packs/day: 1.50    Types: Cigarettes    Quit date: 10/04/2004  . Smokeless tobacco: Former Systems developer  . Alcohol use No    Review of Systems  Constitutional: Negative for fever. Eyes: Negative for visual changes. ENT: Negative for sore throat. Neck: No neck pain  Cardiovascular: Negative for chest pain. Respiratory: Negative for shortness of breath. Gastrointestinal: + suprapubic abdominal pain. No vomiting or diarrhea. Genitourinary: + dysuria. Musculoskeletal: Negative for back pain. Skin: Negative for rash. Neurological: Negative for headaches, weakness or numbness. Psych: No SI or HI  ____________________________________________   PHYSICAL EXAM:  VITAL SIGNS: ED Triage Vitals  Enc Vitals Group     BP 11/25/16 1642 126/70     Pulse Rate 11/25/16 1642 91     Resp 11/25/16 1642 18     Temp 11/25/16 1642 97.6 F (36.4 C)     Temp Source 11/25/16 1642 Oral     SpO2 11/25/16 1642 97 %     Weight 11/25/16 1643 140 lb (63.5 kg)     Height 11/25/16 1643 '5\' 11"'$  (1.803 m)     Head Circumference --      Peak Flow --      Pain Score --      Pain Loc --      Pain Edu? --      Excl. in [place]? --     Constitutional: Alert and oriented. Well appearing and in no apparent distress. HEENT:      Head: Normocephalic and  atraumatic.         Eyes: Conjunctivae are normal. Sclera is non-icteric. EOMI. PERRL      Mouth/Throat: Mucous membranes are moist.       Neck: Supple with no signs of meningismus. Cardiovascular: Regular rate and rhythm. No murmurs, gallops, or rubs. 2+ symmetrical distal pulses are present in all extremities. No JVD. Respiratory: Normal respiratory effort. Lungs are clear to auscultation bilaterally. No wheezes, crackles, or rhonchi.  Gastrointestinal: Soft, mild suprapubic tenderness to palation, and non distended with positive bowel sounds. No rebound or guarding. Genitourinary: No CVA tenderness. Foley catheter in place with no obvious leakage, foul smelling and cloudy urine in the leg bag Musculoskeletal: Nontender with normal range of motion in all extremities. No edema, cyanosis, or erythema of extremities. Neurologic: Normal speech and language. Face is  symmetric. Moving all extremities. No gross focal neurologic deficits are appreciated. Skin: Skin is warm, dry and intact. No rash noted. Psychiatric: Mood and affect are normal. Speech and behavior are normal.  ____________________________________________   LABS (all labs ordered are listed, but only abnormal results are displayed)  Labs Reviewed  URINALYSIS, COMPLETE (UACMP) WITH MICROSCOPIC - Abnormal; Notable for the following:       Result Value   Color, Urine YELLOW (*)    APPearance CLOUDY (*)    Glucose, UA 50 (*)    Hgb urine dipstick MODERATE (*)    Protein, ur 30 (*)    Nitrite POSITIVE (*)    Leukocytes, UA LARGE (*)    Bacteria, UA RARE (*)    All other components within normal limits  CBC WITH DIFFERENTIAL/PLATELET - Abnormal; Notable for the following:    RBC 3.48 (*)    Hemoglobin 11.2 (*)    HCT 33.7 (*)    RDW 16.0 (*)    Lymphs Abs 0.8 (*)    Monocytes Absolute 1.1 (*)    All other components within normal limits  BASIC METABOLIC PANEL - Abnormal; Notable for the following:    Glucose, Bld 171 (*)      BUN 27 (*)    Creatinine, Ser 1.41 (*)    GFR calc non Af Amer 44 (*)    GFR calc Af Amer 51 (*)    All other components within normal limits  URINE CULTURE   ____________________________________________  EKG  none  ____________________________________________  RADIOLOGY  none ____________________________________________   PROCEDURES  Procedure(s) performed: None Procedures Critical Care performed:  None ____________________________________________   INITIAL IMPRESSION / ASSESSMENT AND PLAN / ED COURSE   83 y.o. male with a history of urinary retention status post Foley catheter who presents for evaluation of suprapubic abdominal pain and penile pain. Patient is well-appearing, in no distress, is normal vital signs, he does have foul smelling cloudy urine in his leg bag. Foley catheter is draining urine, no leakage observed at this time. He does have mild suprapubic tenderness palpation. Patient is afebrile. No flank pain or tenderness. We'll do a bladder scan to make sure catheter is draining the bladder, we'll send a UA, we'll send a CBC and a BMP to check patient's kidney function. Will flush the Foley to ensure it is working.   Clinical Course as of Nov 25 2004  Thu Nov 25, 2016  1821 UA concerning for urinary tract infection with positive nitrites, bacteria, large leuk esterases. Patient has a normal white count. Foley flushed with no difficulty and is draining urine. Creatinine elevated to 1.41 from 1.0. Will give IVF, IV ceftriaxone and reassess  [CV]  2005 Patient is making urine, no leakage around the Foley catheter. Reports his pain has resolved. Received ceftriaxone for UTI urinary tract infection. Urine culture is pending. We'll send patient home on Cipro for complicated Or associated UTI. Patient has no systemic symptoms or signs of pyelonephritis with normal white count, and normal vital signs. Patient was given fluid for mild AKI.  [CV]    Clinical Course User  Index [CV] Nita Sickle, MD    Pertinent labs & imaging results that were available during my care of the patient were reviewed by me and considered in my medical decision making (see chart for details).    ____________________________________________   FINAL CLINICAL IMPRESSION(S) / ED DIAGNOSES  Final diagnoses:  Complicated UTI (urinary tract infection)      NEW MEDICATIONS  STARTED DURING THIS VISIT:  New Prescriptions   CIPROFLOXACIN (CIPRO) 500 MG TABLET    Take 1 tablet (500 mg total) by mouth 2 (two) times daily.     Note:  This document was prepared using Dragon voice recognition software and may include unintentional dictation errors.    Rudene Re, MD 11/25/16 2007

## 2016-11-25 NOTE — ED Notes (Signed)
Bladder Scan showed in bladder

## 2016-11-25 NOTE — ED Notes (Signed)
Report called [facility] [place]. Medical necessity completed and printed.

## 2016-11-25 NOTE — ED Triage Notes (Signed)
Patient is complaining of lower abdominal pain and feels that he may be urinating around his catheter.  [facility] [place].  Patient is on oxygen 2L at home all the time.

## 2016-11-27 LAB — URINE CULTURE

## 2016-11-30 DIAGNOSIS — J449 Chronic obstructive pulmonary disease, unspecified: Secondary | ICD-10-CM | POA: Diagnosis not present

## 2016-11-30 DIAGNOSIS — E43 Unspecified severe protein-calorie malnutrition: Secondary | ICD-10-CM | POA: Diagnosis not present

## 2016-11-30 DIAGNOSIS — Z515 Encounter for palliative care: Secondary | ICD-10-CM | POA: Diagnosis not present

## 2016-11-30 DIAGNOSIS — R5382 Chronic fatigue, unspecified: Secondary | ICD-10-CM | POA: Diagnosis not present

## 2016-11-30 DIAGNOSIS — Z9981 Dependence on supplemental oxygen: Secondary | ICD-10-CM | POA: Diagnosis not present

## 2016-11-30 DIAGNOSIS — Z66 Do not resuscitate: Secondary | ICD-10-CM | POA: Diagnosis not present

## 2016-11-30 DIAGNOSIS — I10 Essential (primary) hypertension: Secondary | ICD-10-CM | POA: Diagnosis not present

## 2016-11-30 DIAGNOSIS — E782 Mixed hyperlipidemia: Secondary | ICD-10-CM | POA: Diagnosis not present

## 2016-11-30 DIAGNOSIS — E1165 Type 2 diabetes mellitus with hyperglycemia: Secondary | ICD-10-CM | POA: Diagnosis not present

## 2016-11-30 DIAGNOSIS — I2510 Atherosclerotic heart disease of native coronary artery without angina pectoris: Secondary | ICD-10-CM | POA: Diagnosis not present

## 2016-12-02 DIAGNOSIS — I2510 Atherosclerotic heart disease of native coronary artery without angina pectoris: Secondary | ICD-10-CM | POA: Diagnosis not present

## 2016-12-02 DIAGNOSIS — Z66 Do not resuscitate: Secondary | ICD-10-CM | POA: Diagnosis not present

## 2016-12-02 DIAGNOSIS — E782 Mixed hyperlipidemia: Secondary | ICD-10-CM | POA: Diagnosis not present

## 2016-12-02 DIAGNOSIS — Z9981 Dependence on supplemental oxygen: Secondary | ICD-10-CM | POA: Diagnosis not present

## 2016-12-02 DIAGNOSIS — E1165 Type 2 diabetes mellitus with hyperglycemia: Secondary | ICD-10-CM | POA: Diagnosis not present

## 2016-12-02 DIAGNOSIS — R5382 Chronic fatigue, unspecified: Secondary | ICD-10-CM | POA: Diagnosis not present

## 2016-12-02 DIAGNOSIS — I10 Essential (primary) hypertension: Secondary | ICD-10-CM | POA: Diagnosis not present

## 2016-12-02 DIAGNOSIS — J449 Chronic obstructive pulmonary disease, unspecified: Secondary | ICD-10-CM | POA: Diagnosis not present

## 2016-12-02 DIAGNOSIS — Z515 Encounter for palliative care: Secondary | ICD-10-CM | POA: Diagnosis not present

## 2016-12-02 DIAGNOSIS — E43 Unspecified severe protein-calorie malnutrition: Secondary | ICD-10-CM | POA: Diagnosis not present

## 2016-12-03 DIAGNOSIS — Z9981 Dependence on supplemental oxygen: Secondary | ICD-10-CM | POA: Diagnosis not present

## 2016-12-03 DIAGNOSIS — J449 Chronic obstructive pulmonary disease, unspecified: Secondary | ICD-10-CM | POA: Diagnosis not present

## 2016-12-03 DIAGNOSIS — E1165 Type 2 diabetes mellitus with hyperglycemia: Secondary | ICD-10-CM | POA: Diagnosis not present

## 2016-12-03 DIAGNOSIS — I10 Essential (primary) hypertension: Secondary | ICD-10-CM | POA: Diagnosis not present

## 2016-12-03 DIAGNOSIS — I2510 Atherosclerotic heart disease of native coronary artery without angina pectoris: Secondary | ICD-10-CM | POA: Diagnosis not present

## 2016-12-03 DIAGNOSIS — E43 Unspecified severe protein-calorie malnutrition: Secondary | ICD-10-CM | POA: Diagnosis not present

## 2016-12-04 DIAGNOSIS — I2510 Atherosclerotic heart disease of native coronary artery without angina pectoris: Secondary | ICD-10-CM | POA: Diagnosis not present

## 2016-12-04 DIAGNOSIS — J449 Chronic obstructive pulmonary disease, unspecified: Secondary | ICD-10-CM | POA: Diagnosis not present

## 2016-12-04 DIAGNOSIS — E1165 Type 2 diabetes mellitus with hyperglycemia: Secondary | ICD-10-CM | POA: Diagnosis not present

## 2016-12-04 DIAGNOSIS — Z9981 Dependence on supplemental oxygen: Secondary | ICD-10-CM | POA: Diagnosis not present

## 2016-12-04 DIAGNOSIS — I10 Essential (primary) hypertension: Secondary | ICD-10-CM | POA: Diagnosis not present

## 2016-12-04 DIAGNOSIS — E43 Unspecified severe protein-calorie malnutrition: Secondary | ICD-10-CM | POA: Diagnosis not present

## 2016-12-05 DIAGNOSIS — Z9981 Dependence on supplemental oxygen: Secondary | ICD-10-CM | POA: Diagnosis not present

## 2016-12-05 DIAGNOSIS — E1165 Type 2 diabetes mellitus with hyperglycemia: Secondary | ICD-10-CM | POA: Diagnosis not present

## 2016-12-05 DIAGNOSIS — E43 Unspecified severe protein-calorie malnutrition: Secondary | ICD-10-CM | POA: Diagnosis not present

## 2016-12-05 DIAGNOSIS — I2510 Atherosclerotic heart disease of native coronary artery without angina pectoris: Secondary | ICD-10-CM | POA: Diagnosis not present

## 2016-12-05 DIAGNOSIS — I10 Essential (primary) hypertension: Secondary | ICD-10-CM | POA: Diagnosis not present

## 2016-12-05 DIAGNOSIS — J449 Chronic obstructive pulmonary disease, unspecified: Secondary | ICD-10-CM | POA: Diagnosis not present

## 2016-12-06 DIAGNOSIS — J449 Chronic obstructive pulmonary disease, unspecified: Secondary | ICD-10-CM | POA: Diagnosis not present

## 2016-12-06 DIAGNOSIS — I10 Essential (primary) hypertension: Secondary | ICD-10-CM | POA: Diagnosis not present

## 2016-12-06 DIAGNOSIS — E43 Unspecified severe protein-calorie malnutrition: Secondary | ICD-10-CM | POA: Diagnosis not present

## 2016-12-06 DIAGNOSIS — I2510 Atherosclerotic heart disease of native coronary artery without angina pectoris: Secondary | ICD-10-CM | POA: Diagnosis not present

## 2016-12-06 DIAGNOSIS — E1165 Type 2 diabetes mellitus with hyperglycemia: Secondary | ICD-10-CM | POA: Diagnosis not present

## 2016-12-06 DIAGNOSIS — Z9981 Dependence on supplemental oxygen: Secondary | ICD-10-CM | POA: Diagnosis not present

## 2016-12-07 DIAGNOSIS — I2510 Atherosclerotic heart disease of native coronary artery without angina pectoris: Secondary | ICD-10-CM | POA: Diagnosis not present

## 2016-12-07 DIAGNOSIS — E1165 Type 2 diabetes mellitus with hyperglycemia: Secondary | ICD-10-CM | POA: Diagnosis not present

## 2016-12-07 DIAGNOSIS — E43 Unspecified severe protein-calorie malnutrition: Secondary | ICD-10-CM | POA: Diagnosis not present

## 2016-12-07 DIAGNOSIS — J449 Chronic obstructive pulmonary disease, unspecified: Secondary | ICD-10-CM | POA: Diagnosis not present

## 2016-12-07 DIAGNOSIS — Z9981 Dependence on supplemental oxygen: Secondary | ICD-10-CM | POA: Diagnosis not present

## 2016-12-07 DIAGNOSIS — I10 Essential (primary) hypertension: Secondary | ICD-10-CM | POA: Diagnosis not present

## 2016-12-08 DIAGNOSIS — I2510 Atherosclerotic heart disease of native coronary artery without angina pectoris: Secondary | ICD-10-CM | POA: Diagnosis not present

## 2016-12-08 DIAGNOSIS — J449 Chronic obstructive pulmonary disease, unspecified: Secondary | ICD-10-CM | POA: Diagnosis not present

## 2016-12-08 DIAGNOSIS — I10 Essential (primary) hypertension: Secondary | ICD-10-CM | POA: Diagnosis not present

## 2016-12-08 DIAGNOSIS — E1165 Type 2 diabetes mellitus with hyperglycemia: Secondary | ICD-10-CM | POA: Diagnosis not present

## 2016-12-08 DIAGNOSIS — Z9981 Dependence on supplemental oxygen: Secondary | ICD-10-CM | POA: Diagnosis not present

## 2016-12-08 DIAGNOSIS — E43 Unspecified severe protein-calorie malnutrition: Secondary | ICD-10-CM | POA: Diagnosis not present

## 2016-12-09 DIAGNOSIS — E1165 Type 2 diabetes mellitus with hyperglycemia: Secondary | ICD-10-CM | POA: Diagnosis not present

## 2016-12-09 DIAGNOSIS — I10 Essential (primary) hypertension: Secondary | ICD-10-CM | POA: Diagnosis not present

## 2016-12-09 DIAGNOSIS — E43 Unspecified severe protein-calorie malnutrition: Secondary | ICD-10-CM | POA: Diagnosis not present

## 2016-12-09 DIAGNOSIS — Z9981 Dependence on supplemental oxygen: Secondary | ICD-10-CM | POA: Diagnosis not present

## 2016-12-09 DIAGNOSIS — J449 Chronic obstructive pulmonary disease, unspecified: Secondary | ICD-10-CM | POA: Diagnosis not present

## 2016-12-09 DIAGNOSIS — I2510 Atherosclerotic heart disease of native coronary artery without angina pectoris: Secondary | ICD-10-CM | POA: Diagnosis not present

## 2016-12-12 DIAGNOSIS — E1165 Type 2 diabetes mellitus with hyperglycemia: Secondary | ICD-10-CM | POA: Diagnosis not present

## 2016-12-12 DIAGNOSIS — J449 Chronic obstructive pulmonary disease, unspecified: Secondary | ICD-10-CM | POA: Diagnosis not present

## 2016-12-12 DIAGNOSIS — E782 Mixed hyperlipidemia: Secondary | ICD-10-CM | POA: Diagnosis not present

## 2016-12-12 DIAGNOSIS — D509 Iron deficiency anemia, unspecified: Secondary | ICD-10-CM | POA: Diagnosis not present

## 2016-12-16 DIAGNOSIS — E559 Vitamin D deficiency, unspecified: Secondary | ICD-10-CM | POA: Diagnosis not present

## 2016-12-16 DIAGNOSIS — I10 Essential (primary) hypertension: Secondary | ICD-10-CM | POA: Diagnosis not present

## 2016-12-16 DIAGNOSIS — D509 Iron deficiency anemia, unspecified: Secondary | ICD-10-CM | POA: Diagnosis not present

## 2016-12-16 DIAGNOSIS — E119 Type 2 diabetes mellitus without complications: Secondary | ICD-10-CM | POA: Diagnosis not present

## 2016-12-26 DIAGNOSIS — N390 Urinary tract infection, site not specified: Secondary | ICD-10-CM | POA: Diagnosis not present

## 2016-12-26 DIAGNOSIS — I2510 Atherosclerotic heart disease of native coronary artery without angina pectoris: Secondary | ICD-10-CM | POA: Diagnosis not present

## 2016-12-26 DIAGNOSIS — E119 Type 2 diabetes mellitus without complications: Secondary | ICD-10-CM | POA: Diagnosis not present

## 2016-12-26 DIAGNOSIS — J449 Chronic obstructive pulmonary disease, unspecified: Secondary | ICD-10-CM | POA: Diagnosis not present

## 2017-01-06 DIAGNOSIS — E559 Vitamin D deficiency, unspecified: Secondary | ICD-10-CM | POA: Diagnosis not present

## 2017-01-06 DIAGNOSIS — D509 Iron deficiency anemia, unspecified: Secondary | ICD-10-CM | POA: Diagnosis not present

## 2017-01-06 DIAGNOSIS — J449 Chronic obstructive pulmonary disease, unspecified: Secondary | ICD-10-CM | POA: Diagnosis not present

## 2017-01-06 DIAGNOSIS — E782 Mixed hyperlipidemia: Secondary | ICD-10-CM | POA: Diagnosis not present

## 2017-01-27 DIAGNOSIS — I10 Essential (primary) hypertension: Secondary | ICD-10-CM | POA: Diagnosis not present

## 2017-01-27 DIAGNOSIS — N390 Urinary tract infection, site not specified: Secondary | ICD-10-CM | POA: Diagnosis not present

## 2017-01-27 DIAGNOSIS — N400 Enlarged prostate without lower urinary tract symptoms: Secondary | ICD-10-CM | POA: Diagnosis not present

## 2017-02-22 IMAGING — CR DG ABDOMEN 1V
1 series · 1 of 1 positions shown · non-contrast
Comparison: 12/27/2014 CT

CLINICAL DATA: Kidney stone

EXAM:
ABDOMEN - 1 VIEW

[kdxr kidney ureter bladder]
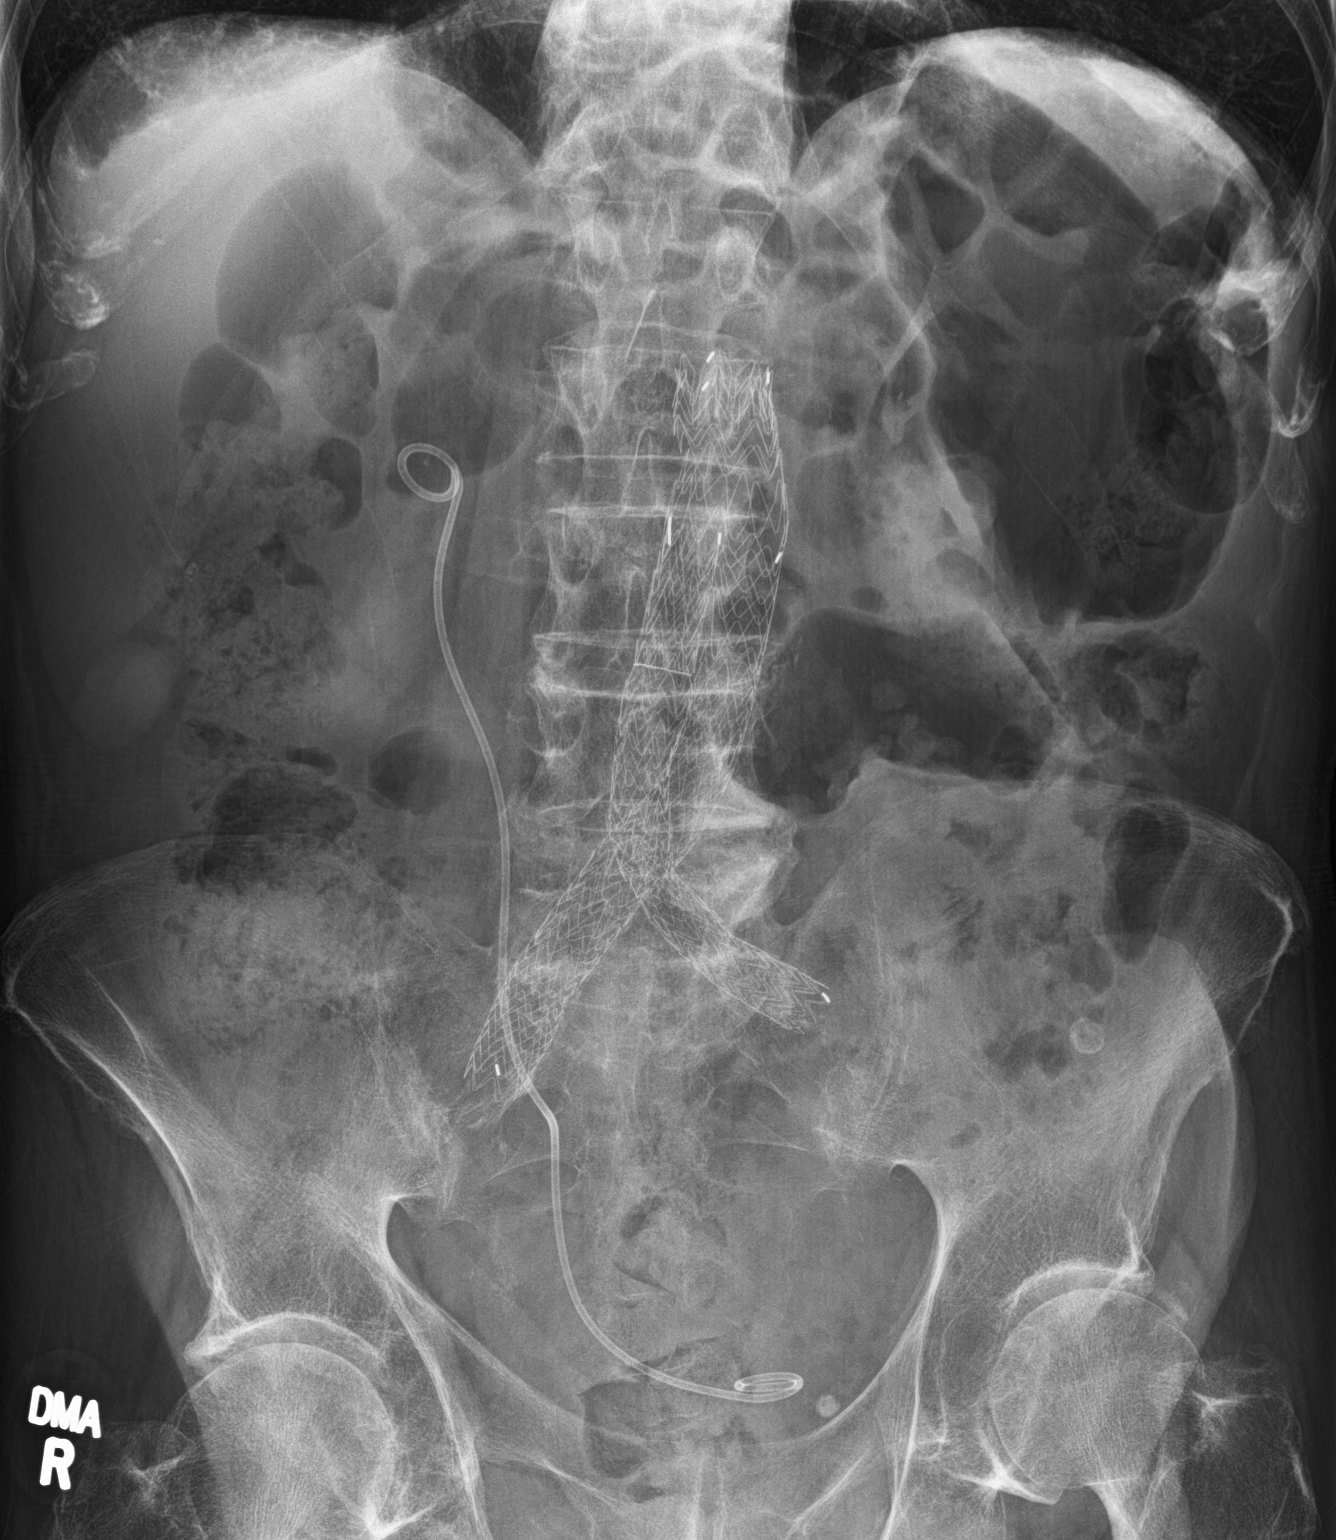

[1 of 1 positions shown; findings below may reference images not displayed]

FINDINGS: Aorto bi-iliac stent graft in place. Right-sided nephro ureteral
stent is identified. No calcific opacity is identified along the
length of the stent. Oval calcification over the pelvis may
correspond to pelvic phlebolith, vascular calcification, or previous
identified bladder calculi. This could also represent interval
migration of the previously seen right ureteral calculus. Mild stool
burden with otherwise normal-appearing bowel gas pattern.
IMPRESSION: No persistent calcific opacity identified over the expected location
of the right ureter with right nephro ureteral stent in place. A
calcification overlying the left anatomic pelvis could represent a
passed stone, other previously visualized bladder calculus, or other
etiology.

## 2017-03-03 DIAGNOSIS — E538 Deficiency of other specified B group vitamins: Secondary | ICD-10-CM | POA: Diagnosis not present

## 2017-03-03 DIAGNOSIS — H409 Unspecified glaucoma: Secondary | ICD-10-CM | POA: Diagnosis not present

## 2017-03-03 DIAGNOSIS — E119 Type 2 diabetes mellitus without complications: Secondary | ICD-10-CM | POA: Diagnosis not present

## 2017-03-03 DIAGNOSIS — N3281 Overactive bladder: Secondary | ICD-10-CM | POA: Diagnosis not present

## 2017-03-22 DIAGNOSIS — E559 Vitamin D deficiency, unspecified: Secondary | ICD-10-CM | POA: Diagnosis not present

## 2017-03-22 DIAGNOSIS — E538 Deficiency of other specified B group vitamins: Secondary | ICD-10-CM | POA: Diagnosis not present

## 2017-03-22 DIAGNOSIS — E119 Type 2 diabetes mellitus without complications: Secondary | ICD-10-CM | POA: Diagnosis not present

## 2017-03-22 DIAGNOSIS — N3281 Overactive bladder: Secondary | ICD-10-CM | POA: Diagnosis not present

## 2017-04-07 DIAGNOSIS — N400 Enlarged prostate without lower urinary tract symptoms: Secondary | ICD-10-CM | POA: Diagnosis not present

## 2017-04-07 DIAGNOSIS — R0602 Shortness of breath: Secondary | ICD-10-CM | POA: Diagnosis not present

## 2017-04-07 DIAGNOSIS — I2510 Atherosclerotic heart disease of native coronary artery without angina pectoris: Secondary | ICD-10-CM | POA: Diagnosis not present

## 2017-04-07 DIAGNOSIS — J9611 Chronic respiratory failure with hypoxia: Secondary | ICD-10-CM | POA: Diagnosis not present

## 2017-04-21 DIAGNOSIS — E785 Hyperlipidemia, unspecified: Secondary | ICD-10-CM | POA: Diagnosis not present

## 2017-04-21 DIAGNOSIS — J449 Chronic obstructive pulmonary disease, unspecified: Secondary | ICD-10-CM | POA: Diagnosis not present

## 2017-04-21 DIAGNOSIS — N400 Enlarged prostate without lower urinary tract symptoms: Secondary | ICD-10-CM | POA: Diagnosis not present

## 2017-04-21 DIAGNOSIS — F17210 Nicotine dependence, cigarettes, uncomplicated: Secondary | ICD-10-CM | POA: Diagnosis not present

## 2017-04-23 DIAGNOSIS — R5383 Other fatigue: Secondary | ICD-10-CM | POA: Diagnosis not present

## 2017-04-23 DIAGNOSIS — E119 Type 2 diabetes mellitus without complications: Secondary | ICD-10-CM | POA: Diagnosis not present

## 2017-04-23 DIAGNOSIS — E785 Hyperlipidemia, unspecified: Secondary | ICD-10-CM | POA: Diagnosis not present

## 2017-04-23 DIAGNOSIS — J449 Chronic obstructive pulmonary disease, unspecified: Secondary | ICD-10-CM | POA: Diagnosis not present

## 2017-04-26 DIAGNOSIS — R0602 Shortness of breath: Secondary | ICD-10-CM | POA: Diagnosis not present

## 2017-04-28 DIAGNOSIS — E119 Type 2 diabetes mellitus without complications: Secondary | ICD-10-CM | POA: Diagnosis not present

## 2017-04-28 DIAGNOSIS — J189 Pneumonia, unspecified organism: Secondary | ICD-10-CM | POA: Diagnosis not present

## 2017-04-28 DIAGNOSIS — J9611 Chronic respiratory failure with hypoxia: Secondary | ICD-10-CM | POA: Diagnosis not present

## 2017-04-28 DIAGNOSIS — E785 Hyperlipidemia, unspecified: Secondary | ICD-10-CM | POA: Diagnosis not present

## 2017-05-05 DIAGNOSIS — J189 Pneumonia, unspecified organism: Secondary | ICD-10-CM | POA: Diagnosis not present

## 2017-05-05 DIAGNOSIS — I10 Essential (primary) hypertension: Secondary | ICD-10-CM | POA: Diagnosis not present

## 2017-05-05 DIAGNOSIS — D649 Anemia, unspecified: Secondary | ICD-10-CM | POA: Diagnosis not present

## 2017-05-05 DIAGNOSIS — J449 Chronic obstructive pulmonary disease, unspecified: Secondary | ICD-10-CM | POA: Diagnosis not present

## 2017-05-12 DIAGNOSIS — N400 Enlarged prostate without lower urinary tract symptoms: Secondary | ICD-10-CM | POA: Diagnosis not present

## 2017-05-12 DIAGNOSIS — D62 Acute posthemorrhagic anemia: Secondary | ICD-10-CM | POA: Diagnosis not present

## 2017-05-12 DIAGNOSIS — J449 Chronic obstructive pulmonary disease, unspecified: Secondary | ICD-10-CM | POA: Diagnosis not present

## 2017-05-12 DIAGNOSIS — R5381 Other malaise: Secondary | ICD-10-CM | POA: Diagnosis not present

## 2017-05-15 DIAGNOSIS — I10 Essential (primary) hypertension: Secondary | ICD-10-CM | POA: Diagnosis not present

## 2017-05-15 DIAGNOSIS — J449 Chronic obstructive pulmonary disease, unspecified: Secondary | ICD-10-CM | POA: Diagnosis not present

## 2017-05-15 DIAGNOSIS — E119 Type 2 diabetes mellitus without complications: Secondary | ICD-10-CM | POA: Diagnosis not present

## 2017-06-04 DEATH — deceased
